# Patient Record
Sex: Male | Born: 1940 | Race: White | Hispanic: No | Smoking: Former smoker
Health system: Southern US, Community
[De-identification: ages and names within clinical notes are randomized; demographics above are authoritative.]

## PROBLEM LIST (undated history)

## (undated) DIAGNOSIS — G911 Obstructive hydrocephalus: Secondary | ICD-10-CM

## (undated) DIAGNOSIS — K1321 Leukoplakia of oral mucosa, including tongue: Secondary | ICD-10-CM

## (undated) DIAGNOSIS — T8859XA Other complications of anesthesia, initial encounter: Secondary | ICD-10-CM

## (undated) DIAGNOSIS — M1990 Unspecified osteoarthritis, unspecified site: Secondary | ICD-10-CM

## (undated) DIAGNOSIS — K5190 Ulcerative colitis, unspecified, without complications: Secondary | ICD-10-CM

## (undated) DIAGNOSIS — H8110 Benign paroxysmal vertigo, unspecified ear: Secondary | ICD-10-CM

## (undated) DIAGNOSIS — J45909 Unspecified asthma, uncomplicated: Secondary | ICD-10-CM

## (undated) DIAGNOSIS — M2540 Effusion, unspecified joint: Secondary | ICD-10-CM

## (undated) DIAGNOSIS — Z860100 Personal history of colon polyps, unspecified: Secondary | ICD-10-CM

## (undated) DIAGNOSIS — E785 Hyperlipidemia, unspecified: Secondary | ICD-10-CM

## (undated) DIAGNOSIS — R2689 Other abnormalities of gait and mobility: Secondary | ICD-10-CM

## (undated) DIAGNOSIS — T4145XA Adverse effect of unspecified anesthetic, initial encounter: Secondary | ICD-10-CM

## (undated) DIAGNOSIS — I10 Essential (primary) hypertension: Secondary | ICD-10-CM

## (undated) DIAGNOSIS — H269 Unspecified cataract: Secondary | ICD-10-CM

## (undated) DIAGNOSIS — IMO0001 Reserved for inherently not codable concepts without codable children: Secondary | ICD-10-CM

## (undated) DIAGNOSIS — K219 Gastro-esophageal reflux disease without esophagitis: Secondary | ICD-10-CM

## (undated) DIAGNOSIS — Z9289 Personal history of other medical treatment: Secondary | ICD-10-CM

## (undated) DIAGNOSIS — C4491 Basal cell carcinoma of skin, unspecified: Secondary | ICD-10-CM

## (undated) HISTORY — DX: Hyperlipidemia, unspecified: E78.5

## (undated) HISTORY — DX: Benign paroxysmal vertigo, unspecified ear: H81.10

## (undated) HISTORY — PX: COLONOSCOPY: SHX174

## (undated) HISTORY — DX: Obstructive hydrocephalus: G91.1

## (undated) HISTORY — DX: Ulcerative colitis, unspecified, without complications: K51.90

## (undated) HISTORY — DX: Other abnormalities of gait and mobility: R26.89

## (undated) HISTORY — DX: Essential (primary) hypertension: I10

---

## 1989-04-11 DIAGNOSIS — K1321 Leukoplakia of oral mucosa, including tongue: Secondary | ICD-10-CM

## 1989-04-11 HISTORY — DX: Leukoplakia of oral mucosa, including tongue: K13.21

## 1999-04-12 HISTORY — PX: BASAL CELL CARCINOMA EXCISION: SHX1214

## 1999-11-28 ENCOUNTER — Ambulatory Visit (HOSPITAL_COMMUNITY): Admission: RE | Admit: 1999-11-28 | Discharge: 1999-11-28 | Payer: Self-pay | Admitting: Gastroenterology

## 2003-10-23 ENCOUNTER — Ambulatory Visit (HOSPITAL_COMMUNITY): Admission: RE | Admit: 2003-10-23 | Discharge: 2003-10-23 | Payer: Self-pay | Admitting: Gastroenterology

## 2004-06-13 ENCOUNTER — Ambulatory Visit: Payer: Self-pay | Admitting: Family Medicine

## 2004-07-09 ENCOUNTER — Ambulatory Visit: Payer: Self-pay | Admitting: Family Medicine

## 2004-08-30 ENCOUNTER — Ambulatory Visit: Payer: Self-pay | Admitting: Family Medicine

## 2004-12-30 ENCOUNTER — Ambulatory Visit: Payer: Self-pay | Admitting: Family Medicine

## 2005-05-05 ENCOUNTER — Ambulatory Visit: Payer: Self-pay | Admitting: Family Medicine

## 2005-05-12 ENCOUNTER — Ambulatory Visit: Payer: Self-pay

## 2005-05-21 ENCOUNTER — Encounter: Payer: Self-pay | Admitting: Cardiology

## 2005-05-21 ENCOUNTER — Ambulatory Visit: Payer: Self-pay | Admitting: Cardiology

## 2005-05-21 ENCOUNTER — Ambulatory Visit (HOSPITAL_COMMUNITY): Admission: RE | Admit: 2005-05-21 | Discharge: 2005-05-21 | Payer: Self-pay | Admitting: Family Medicine

## 2005-05-22 ENCOUNTER — Ambulatory Visit: Payer: Self-pay | Admitting: Family Medicine

## 2005-06-10 ENCOUNTER — Ambulatory Visit: Payer: Self-pay | Admitting: Family Medicine

## 2006-06-10 ENCOUNTER — Ambulatory Visit: Payer: Self-pay | Admitting: Family Medicine

## 2006-10-06 ENCOUNTER — Ambulatory Visit: Payer: Self-pay | Admitting: Family Medicine

## 2008-02-02 ENCOUNTER — Ambulatory Visit (HOSPITAL_COMMUNITY): Admission: RE | Admit: 2008-02-02 | Discharge: 2008-02-02 | Payer: Self-pay | Admitting: Family Medicine

## 2008-02-21 ENCOUNTER — Inpatient Hospital Stay (HOSPITAL_COMMUNITY): Admission: RE | Admit: 2008-02-21 | Discharge: 2008-02-26 | Payer: Self-pay | Admitting: Urology

## 2008-02-21 ENCOUNTER — Encounter (INDEPENDENT_AMBULATORY_CARE_PROVIDER_SITE_OTHER): Payer: Self-pay | Admitting: Urology

## 2008-02-21 HISTORY — PX: SUPRAPUBIC PROSTATECTOMY: SUR1056

## 2008-11-29 ENCOUNTER — Encounter: Admission: RE | Admit: 2008-11-29 | Discharge: 2008-11-29 | Payer: Self-pay | Admitting: Neurosurgery

## 2009-01-29 ENCOUNTER — Encounter: Admission: RE | Admit: 2009-01-29 | Discharge: 2009-04-29 | Payer: Self-pay | Admitting: Family Medicine

## 2009-04-30 ENCOUNTER — Encounter: Admission: RE | Admit: 2009-04-30 | Discharge: 2009-07-29 | Payer: Self-pay | Admitting: Family Medicine

## 2009-07-30 ENCOUNTER — Encounter: Admission: RE | Admit: 2009-07-30 | Discharge: 2009-08-13 | Payer: Self-pay | Admitting: Family Medicine

## 2009-09-04 ENCOUNTER — Encounter: Admission: RE | Admit: 2009-09-04 | Discharge: 2009-11-28 | Payer: Self-pay | Admitting: Family Medicine

## 2010-04-09 ENCOUNTER — Encounter: Admission: RE | Admit: 2010-04-09 | Discharge: 2010-07-10 | Payer: Self-pay | Admitting: Family Medicine

## 2010-04-26 ENCOUNTER — Encounter: Admission: RE | Admit: 2010-04-26 | Discharge: 2010-04-26 | Payer: Self-pay | Admitting: Neurosurgery

## 2010-07-11 ENCOUNTER — Encounter
Admission: RE | Admit: 2010-07-11 | Discharge: 2010-08-08 | Payer: Self-pay | Source: Home / Self Care | Attending: Family Medicine | Admitting: Family Medicine

## 2010-08-08 ENCOUNTER — Ambulatory Visit (HOSPITAL_COMMUNITY)
Admission: RE | Admit: 2010-08-08 | Discharge: 2010-08-08 | Payer: Self-pay | Source: Home / Self Care | Attending: Family Medicine | Admitting: Family Medicine

## 2010-08-11 ENCOUNTER — Encounter: Admission: RE | Admit: 2010-08-11 | Payer: Self-pay | Source: Home / Self Care | Admitting: Family Medicine

## 2010-11-12 ENCOUNTER — Other Ambulatory Visit: Payer: Self-pay | Admitting: Dermatology

## 2010-12-02 ENCOUNTER — Ambulatory Visit: Payer: Medicare Other | Admitting: Physical Therapy

## 2010-12-03 ENCOUNTER — Other Ambulatory Visit: Payer: Self-pay | Admitting: Dermatology

## 2010-12-16 ENCOUNTER — Ambulatory Visit: Payer: Medicare Other | Attending: Family Medicine | Admitting: Physical Therapy

## 2010-12-16 DIAGNOSIS — R5381 Other malaise: Secondary | ICD-10-CM | POA: Insufficient documentation

## 2010-12-16 DIAGNOSIS — IMO0001 Reserved for inherently not codable concepts without codable children: Secondary | ICD-10-CM | POA: Insufficient documentation

## 2010-12-16 DIAGNOSIS — R269 Unspecified abnormalities of gait and mobility: Secondary | ICD-10-CM | POA: Insufficient documentation

## 2010-12-17 ENCOUNTER — Ambulatory Visit: Payer: Medicare Other | Admitting: *Deleted

## 2010-12-19 ENCOUNTER — Ambulatory Visit: Payer: Medicare Other | Admitting: Physical Therapy

## 2010-12-23 ENCOUNTER — Ambulatory Visit: Payer: Medicare Other | Admitting: Physical Therapy

## 2010-12-24 ENCOUNTER — Ambulatory Visit: Payer: Medicare Other | Admitting: Physical Therapy

## 2010-12-24 NOTE — Op Note (Signed)
NAMECLAYBURN, [name]                  ACCOUNT NO.:  [id number]   MEDICAL RECORD NO.:  [id number]          PATIENT TYPE:  INP   LOCATION:  1432                         FACILITY:  [facility]   PHYSICIAN:  Sigmund I. Patsi Sears, M.D.DATE OF BIRTH:  06-15-1941   DATE OF PROCEDURE:  DATE OF DISCHARGE:                               OPERATIVE REPORT   PREOPERATIVE DIAGNOSIS:  Benign prostatic hypertrophy with urinary  obstruction.   POSTOPERATIVE DIAGNOSIS:  Benign prostatic hypertrophy with urinary  obstruction.   PROCEDURE:  1. Suprapubic prostatectomy.  2. Placement of On-Q dual-lumen pain infusion pump.   ATTENDING PHYSICIAN:  Sigmund I. Patsi Sears, M.D.   RESIDENT PHYSICIAN:  Maudie Flakes, M.D.   ANESTHESIA:  General plus local.   INDICATIONS FOR PROCEDURE:  Mr. Lofaro is a 70 year old white male with  past medical history positive for benign prostatic  hypertrophy/hyperplasia with obstruction.  This has been refractory to  conservative therapy including oral medications.  Preoperatively his  ultrasound demonstrated a gland of approximately 150 mL with a prominent  median lobe.  It was felt our office-based endoscopic therapies likewise  would be technically difficult and would likely have a high rate of  failure or complications.  After risks, benefits, consequences and  concerns were discussed with the patient, informed consent was obtained.   PROCEDURE NOTE:  The patient was brought to the operating room and  placed in supine position.  He was correctly identified by his  wristband, appropriate time-out was taken.  IV antibiotics were  administered.  A general anesthesia was delivered.  Once adequately  anesthetized, his lower abdomen was clipped, prepped and draped  sterilely.  The table was made in slight Trendelenburg.  We made a low  midline incision from the umbilicus to the pubic symphysis.  We used the  combination of blunt dissection and electrocautery to get to the level  of the anterior rectus sheath.  This was sharply divided with  electrocautery in the midline and separated.  The rectus muscles were  gently separated as well.  We placed our Balfour retractor.  We used  blunt dissection to sweep off the mesenchymal tissues from the  anterolateral surface of the bladder on both sides.  He had a 24-French  ureteral catheter in place with the balloon inflated to 30 mL of sterile  water.  The balloon was easily palpated in the bladder and the bladder  was slightly filled.  The large prostate gland was also readily  apparent.  We then placed three holding sutures, one was a figure-of-  eight suture in the midline just cranial to the bladder neck, the other  two were on either side to the anterior aspect of the dome of bladder.  We then made our cystotomy in a vertical fashion using Bovie  electrocautery.  The urine and bladder irrigation thus drained freely.  We replaced our Balfour retractor inside of the bladder.  We easily  identified prominent bifid median lobe to the prostate gland.  We were  able to cannulate both ureteral orifices with 5-French feeding tubes.  He had been  given indigo carmine and the drainage from both feeding  tubes was likewise blue.  We then used Bovie electrocautery to score out  our incision line to the anterior aspect of the posterior margin of the  median lobe.  We then used Metzenbaum scissors to find the appropriate  plane between the adenoma the capsule.  We then developed this plane  circumferentially at all aspects of the prostate gland including the  median lobe and both lateral lobes.  We were able to freely dissect down  to the apex of the gland where we completed our prostatectomy by  amputating this part in easily a pincher method.  The prostatic adenoma  was subsequently elevated out of the prostatic fossa.  The balloon of  the Foley catheter was let down and the prostate specimen was passed off  of the table after it  was appropriately marked for pathology.  We then  turned our attention to closure.  We copiously irrigated the bladder and  prostatic fossa with normal saline.  We placed two figure-of-eight  sutures, one at 5 o'clock, the other at the 7 o'clock position in the  prostate to help with hemostasis.  Once these sutures were placed,  we  then placed Surgicel down in the prostatic fossa to also help with  hemostasis.  We reapproximated the bladder mucosa that was overlying the  median lobe to the bladder mucosa at the distal prostatic urethra.  We  then added some more irrigation to our Foley catheter balloon, bringing  it to a total of 45 mL of sterile water.  We then removed our Balfour  retractor.  We verified the patency of both ureters by again passing the  feeding tubes in the ureters without any difficulty.  We then placed a  left-sided 24-French suprapubic tube by making a stab wound in the left  lower quadrant and bringing a Kelly clamp out from just anterior to the  rectus muscle through the fascia, out through the skin, and pulling our  suprapubic tube into the peritoneal cavity.  We then did the same thing  through our bladder wall.  The suprapubic catheter was brought into the  bladder and the balloon was inflated with 5 mL of sterile water.  We  then closed the bladder in two layers using 2-0 Vicryl.  The deep layer  reapproximated mucosa to mucosa fashion.  The superficial layer  reapproximated the muscle and serosa in an imbricating fashion.  We then  tested the bladder and it was watertight.  We then placed a right-sided  flat Blake drain by making a stab incision in the right lower quadrant,  bringing a tonsil out from the prerectal space.  We then placed our  drain into the deep pelvis.  We secured both the suprapubic tube and the  Blake drain with Ethilon suture.  We then turned our attention to our  wound closure.  Again we copiously irrigated our surgical bed and  verified  hemostasis.  We closed the fascia with a running suture using 0  PDS.  After suture we verified this digitally and there were no areas of  weakness or possible site for herniation.  We then placed our On-Q  infusion catheters by advancing the needle and sheath just above the  rectus fascia, one on the right and one on the left.  The needle was  removed, the pain catheter was advanced through the sheath, and once  perfectly positioned, the sheath was removed and  both catheters were  connected to the infusion pump.  We then closed the skin with surgical  staples.  This marked the end of our procedure.  He tolerated the  procedure well.  There were no complications.  Dr. Patsi Sears was  present and participated in all aspects of the case.  General anesthesia  was reversed.  He was extubated and taken to the recovery room in stable  condition.   It is our conclusion that at the end of the case that this was the best  operative treatment for Mr. Lachney given the large size of his gland, the  prominence of his median lobe, and his general prostatic anatomy.     ______________________________  Maudie Flakes, M.D.      Sigmund I. Patsi Sears, M.D.  Electronically Signed    DW/MEDQ  D:  02/21/2008  T:  02/21/2008  Job:  [id number]

## 2010-12-26 ENCOUNTER — Ambulatory Visit: Payer: Medicare Other | Admitting: *Deleted

## 2010-12-27 NOTE — Op Note (Signed)
[name], [name]                            ACCOUNT NO.:  [id number]   MEDICAL RECORD NO.:  [id number]                   PATIENT TYPE:  AMB   LOCATION:  ENDO                                 FACILITY:  [facility]   PHYSICIAN:  James L. Malon Kindle., M.D.          DATE OF BIRTH:  1941/01/09   DATE OF PROCEDURE:  10/23/2003  DATE OF DISCHARGE:                                 OPERATIVE REPORT   PROCEDURE:  Colonoscopy.   MEDICATIONS:  Fentanyl 75 mcg, Versed 6 mg IV.   INDICATIONS FOR PROCEDURE:  Done as a three year followup. The patient's had  previous adenomatous colon polyps __________ and removed, this is done as a  followup.   DESCRIPTION OF PROCEDURE:  The procedure had been explained to the patient  and consent obtained. With the patient in the left lateral decubitus  position, the Olympus scope was inserted and advanced under direct  visualization. The prep was excellent. We were able to reach the cecum  without any problem. The ileocecal valve and appendiceal orifice were seen.  The scope was withdrawn and the cecum, ascending colon, transverse colon,  splenic flexure, descending and sigmoid colon were seen well, no polyps or  other lesions were seen. The sigmoid and rectum were free of polyps. The  scope was withdrawn. The patient tolerated the procedure well and was  maintained on low flow oxygen and pulse oximeter throughout the procedure.   ASSESSMENT:  Previous history of colon polyps with no polyps seen on this  exam, V12.72.   PLAN:  Will recommend yearly Hemoccults and repeat procedure in five years.                                               James L. Malon Kindle., M.D.    Waldron Session  D:  10/23/2003  T:  10/23/2003  Job:  [id number]   cc:   Teena Irani. Arlyce Dice, M.D.  [address]  [place]  [place] [address]  Fax: [phone]

## 2010-12-27 NOTE — Discharge Summary (Signed)
[name], [name]                  ACCOUNT NO.:  [id number]   MEDICAL RECORD NO.:  [id number]          PATIENT TYPE:  INP   LOCATION:  1432                         FACILITY:  [facility]   PHYSICIAN:  Sigmund I. Patsi Sears, M.D.DATE OF BIRTH:  1941-05-17   DATE OF ADMISSION:  02/21/2008  DATE OF DISCHARGE:  02/26/2008                               DISCHARGE SUMMARY   FINAL DIAGNOSIS:  Benign prostatic hypertrophy.   OPERATION:  February 21, 2008.  The operation is suprapubic prostatectomy  and placement of On-Q dual lumen pain infusion pump.   HISTORY:  Mr. [name] is a 70 year old male who was evaluated in the office  with urinary urgency, urge incontinence.  The patient was originally  treated with Flomax with improvement in his symptoms, but eventually  failed medical therapy.  The patient was noted on cystoscopy to have a  very large prostate, and on cystoscopy had a large median lobe.  He is  scheduled for open suprapubic prostatectomy.  The patient  has been  having gross hematuria secondary to median lobe bleeding, with failure  of combination medical therapy with alpha blocker and 5-alpha reductase  treatments.  He was evaluated in the office, and felt to be a poor  candidate for microwave thermotherapy, radiofrequency needle placement,  laser prostatectomy, or TURP.  Because of enlarged median lobe, he is  scheduled for open suprapubic prostatectomy.   The patient was admitted via the operating room, following suprapubic  prostatectomy and placement of a pain pump.  The patient did well  postoperatively, and underwent clot irrigation from his bladder.  This  led to a syncopal episode, from which the patient recovered with rapid  response.  The patient's urine cleared, and his hemoglobin stabilized at  7.7.  He was transfused 2 units and responded well with a hemoglobin of  9.3.  On the 17th, the pain pump was  removed.  The patient was placed  on oral pain medication, and on July 18,  the patient was allowed to be  discharged.  The patient will return for cystogram, and suprapubic tube  removal.  Pathology showed BPH only.  He is discharged in stable  condition.      Sigmund I. Patsi Sears, M.D.  Electronically Signed     SIT/MEDQ  D:  04/20/2008  T:  04/20/2008  Job:  [id number]   cc:   Delaney Meigs, M.D.  Fax: [phone]

## 2010-12-27 NOTE — Procedures (Signed)
[place]  Patient:    [name], [name]                         MRN: [id number] Proc. Date: 11/28/99 Adm. Date:  [id number] Attending:  Orland Mustard CC:         Nicky Pugh. Lenon Ahmadi, M.D.                           Procedure Report  PROCEDURE PERFORMED:  Colonoscopy with polypectomy.  ENDOSCOPIST:  Llana Aliment. Randa Evens, M.D.  MEDICATIONS USED:  Fentanyl 75 mcg, Versed 6 mg IV.  INDICATIONS:  The patient underwent a flexible sigmoidoscopy with the finding of a small polyp at 30 cm.  DESCRIPTION OF PROCEDURE:  The procedure had been explained to the patient and consent obtained.  With the patient in the left lateral decubitus position, the Olympus adult video colonoscope was inserted and advanced under direct visualization.  The prep was excellent and we were able to advance to the cecum without difficulty.  The right lower quadrant transilluminated.  The ileocecal valve and appendiceal orifice were seen.  The scope was withdrawn. The cecum, ascending colon, hepatic flexure, transverse colon, splenic flexure, descending and sigmoid colon were seen well.  At 30 cm a 0.5 cm sessile polyp was removed with a snare and sucked through the scope.  Near the rectosigmoid junction another 0.5 cm polyp was snared and sucked through the scope.  Both polyps were recovered.  The scope was withdrawn, patient tolerated the procedure well.  Maintained on low flow oxygen and pulse oximeter throughout the procedure with no obvious problem.  ASSESSMENT:  Polyps in the rectosigmoid and sigmoid colon.  PLAN:  Will check path.  I suspect these will be adenomatous.  If so, will recommend repeating in three years.  Routine postpolypectomy instructions. DD:  11/28/99 TD:  11/28/99 Job: [id number]

## 2010-12-30 ENCOUNTER — Ambulatory Visit: Payer: Medicare Other | Admitting: Physical Therapy

## 2011-01-01 ENCOUNTER — Ambulatory Visit: Payer: Medicare Other | Admitting: Physical Therapy

## 2011-01-02 ENCOUNTER — Ambulatory Visit: Payer: Medicare Other | Admitting: Physical Therapy

## 2011-01-07 ENCOUNTER — Ambulatory Visit: Payer: Medicare Other | Admitting: Physical Therapy

## 2011-01-09 ENCOUNTER — Ambulatory Visit: Payer: Medicare Other | Attending: Family Medicine | Admitting: Physical Therapy

## 2011-01-09 DIAGNOSIS — IMO0001 Reserved for inherently not codable concepts without codable children: Secondary | ICD-10-CM | POA: Insufficient documentation

## 2011-01-09 DIAGNOSIS — R269 Unspecified abnormalities of gait and mobility: Secondary | ICD-10-CM | POA: Insufficient documentation

## 2011-01-09 DIAGNOSIS — R5381 Other malaise: Secondary | ICD-10-CM | POA: Insufficient documentation

## 2011-01-13 ENCOUNTER — Ambulatory Visit: Payer: Medicare Other | Attending: Family Medicine | Admitting: Physical Therapy

## 2011-01-13 DIAGNOSIS — R269 Unspecified abnormalities of gait and mobility: Secondary | ICD-10-CM | POA: Insufficient documentation

## 2011-01-13 DIAGNOSIS — R5381 Other malaise: Secondary | ICD-10-CM | POA: Insufficient documentation

## 2011-01-13 DIAGNOSIS — IMO0001 Reserved for inherently not codable concepts without codable children: Secondary | ICD-10-CM | POA: Insufficient documentation

## 2011-01-16 ENCOUNTER — Ambulatory Visit: Payer: Medicare Other | Admitting: Physical Therapy

## 2011-01-20 ENCOUNTER — Ambulatory Visit: Payer: Medicare Other | Admitting: Physical Therapy

## 2011-01-23 ENCOUNTER — Ambulatory Visit: Payer: Medicare Other | Admitting: Physical Therapy

## 2011-01-27 ENCOUNTER — Ambulatory Visit: Payer: Medicare Other | Admitting: Physical Therapy

## 2011-01-30 ENCOUNTER — Ambulatory Visit: Payer: Medicare Other | Admitting: Physical Therapy

## 2011-02-04 ENCOUNTER — Ambulatory Visit: Payer: Medicare Other | Admitting: Physical Therapy

## 2011-02-06 ENCOUNTER — Ambulatory Visit: Payer: Medicare Other | Admitting: Physical Therapy

## 2011-02-10 ENCOUNTER — Ambulatory Visit: Payer: Medicare Other | Attending: Family Medicine | Admitting: Physical Therapy

## 2011-02-10 DIAGNOSIS — R269 Unspecified abnormalities of gait and mobility: Secondary | ICD-10-CM | POA: Insufficient documentation

## 2011-02-10 DIAGNOSIS — IMO0001 Reserved for inherently not codable concepts without codable children: Secondary | ICD-10-CM | POA: Insufficient documentation

## 2011-02-10 DIAGNOSIS — R5381 Other malaise: Secondary | ICD-10-CM | POA: Insufficient documentation

## 2011-02-13 ENCOUNTER — Ambulatory Visit: Payer: Medicare Other | Admitting: Physical Therapy

## 2011-02-17 ENCOUNTER — Ambulatory Visit: Payer: Medicare Other | Admitting: Physical Therapy

## 2011-02-20 ENCOUNTER — Ambulatory Visit: Payer: Medicare Other | Admitting: Physical Therapy

## 2011-02-25 ENCOUNTER — Ambulatory Visit: Payer: Medicare Other | Admitting: Physical Therapy

## 2011-02-27 ENCOUNTER — Ambulatory Visit: Payer: Medicare Other | Admitting: Physical Therapy

## 2011-05-08 ENCOUNTER — Ambulatory Visit: Payer: PRIVATE HEALTH INSURANCE | Admitting: Physical Therapy

## 2011-05-08 LAB — BASIC METABOLIC PANEL
BUN: 10
BUN: 15
CO2: 26
CO2: 28
Calcium: 8.2 — ABNORMAL LOW
Calcium: 9.5
Chloride: 100
Chloride: 102
Creatinine, Ser: 0.66
Creatinine, Ser: 0.81
GFR calc Af Amer: >60
GFR calc Af Amer: >60
GFR calc non Af Amer: >60
GFR calc non Af Amer: >60
Glucose, Bld: 143 — ABNORMAL HIGH
Glucose, Bld: 88
Potassium: 4
Potassium: 4.6
Sodium: 135
Sodium: 137

## 2011-05-08 LAB — HEMOGLOBIN AND HEMATOCRIT, BLOOD
HCT: 24.7 — ABNORMAL LOW
HCT: 44.7
Hemoglobin: 15.2
Hemoglobin: 8.5 — ABNORMAL LOW

## 2011-05-08 LAB — TYPE AND SCREEN
ABO/RH(D): O POS
Antibody Screen: NEGATIVE

## 2011-05-08 LAB — CARDIAC PANEL(CRET KIN+CKTOT+MB+TROPI)
CK, MB: 3.5
CK, MB: 4.1 — ABNORMAL HIGH
Relative Index: 1.9
Relative Index: 2.5
Total CK: 167
Total CK: 180
Troponin I: 0.01
Troponin I: 0.02

## 2011-05-08 LAB — DIFFERENTIAL
Basophils Absolute: 0
Basophils Relative: 0
Eosinophils Absolute: 0
Eosinophils Relative: 0
Lymphocytes Relative: 11 — ABNORMAL LOW
Lymphs Abs: 0.8
Monocytes Absolute: 0.6
Monocytes Relative: 8
Neutro Abs: 5.9
Neutrophils Relative %: 80 — ABNORMAL HIGH

## 2011-05-08 LAB — CBC
HCT: 28.5 — ABNORMAL LOW
Hemoglobin: 9.8 — ABNORMAL LOW
MCHC: 34.4
MCV: 90.7
Platelets: 173
RBC: 3.14 — ABNORMAL LOW
RDW: 13
WBC: 7.3

## 2011-05-08 LAB — CREATININE, FLUID (PLEURAL, PERITONEAL, JP DRAINAGE): Creat, Fluid: 0.8

## 2011-05-08 LAB — ABO/RH: ABO/RH(D): O POS

## 2011-05-09 LAB — TROPONIN I: Troponin I: 0.03

## 2011-05-09 LAB — CK ISOENZYMES
CK-BB: 3 % — ABNORMAL HIGH
CK-MB: 0 % (ref <5)
CK-MM: 70
Creatine Kinase-Total: 105 U/L (ref 44–196)

## 2011-05-09 LAB — HEMOGLOBIN AND HEMATOCRIT, BLOOD
HCT: 22.4 — ABNORMAL LOW
HCT: 26.9 — ABNORMAL LOW
Hemoglobin: 7.7 — CL
Hemoglobin: 9.3 — ABNORMAL LOW

## 2011-05-13 ENCOUNTER — Ambulatory Visit: Payer: PRIVATE HEALTH INSURANCE | Admitting: Physical Therapy

## 2011-05-16 ENCOUNTER — Other Ambulatory Visit: Payer: Self-pay | Admitting: Neurology

## 2011-05-16 DIAGNOSIS — I10 Essential (primary) hypertension: Secondary | ICD-10-CM

## 2011-05-16 DIAGNOSIS — E785 Hyperlipidemia, unspecified: Secondary | ICD-10-CM

## 2011-05-16 DIAGNOSIS — G911 Obstructive hydrocephalus: Secondary | ICD-10-CM

## 2011-05-16 DIAGNOSIS — R269 Unspecified abnormalities of gait and mobility: Secondary | ICD-10-CM

## 2011-05-16 DIAGNOSIS — H8110 Benign paroxysmal vertigo, unspecified ear: Secondary | ICD-10-CM

## 2011-05-21 ENCOUNTER — Ambulatory Visit
Admission: RE | Admit: 2011-05-21 | Discharge: 2011-05-21 | Disposition: A | Discharge status: Home / Self Care | Payer: Medicare Other | Source: Ambulatory Visit | Attending: Neurology | Admitting: Neurology

## 2011-05-21 DIAGNOSIS — I10 Essential (primary) hypertension: Secondary | ICD-10-CM

## 2011-05-21 DIAGNOSIS — H8110 Benign paroxysmal vertigo, unspecified ear: Secondary | ICD-10-CM

## 2011-05-21 DIAGNOSIS — R269 Unspecified abnormalities of gait and mobility: Secondary | ICD-10-CM

## 2011-05-21 DIAGNOSIS — E785 Hyperlipidemia, unspecified: Secondary | ICD-10-CM

## 2011-05-21 DIAGNOSIS — G911 Obstructive hydrocephalus: Secondary | ICD-10-CM

## 2011-05-28 ENCOUNTER — Ambulatory Visit: Payer: Medicare Other | Attending: Neurology | Admitting: Physical Therapy

## 2011-05-28 DIAGNOSIS — R269 Unspecified abnormalities of gait and mobility: Secondary | ICD-10-CM | POA: Insufficient documentation

## 2011-05-28 DIAGNOSIS — M6281 Muscle weakness (generalized): Secondary | ICD-10-CM | POA: Insufficient documentation

## 2011-05-28 DIAGNOSIS — IMO0001 Reserved for inherently not codable concepts without codable children: Secondary | ICD-10-CM | POA: Insufficient documentation

## 2011-06-04 ENCOUNTER — Ambulatory Visit: Payer: Medicare Other | Admitting: Physical Therapy

## 2011-06-06 ENCOUNTER — Ambulatory Visit: Payer: Medicare Other | Admitting: Physical Therapy

## 2011-06-09 ENCOUNTER — Ambulatory Visit: Payer: Medicare Other | Admitting: Physical Therapy

## 2011-06-11 ENCOUNTER — Ambulatory Visit: Payer: Medicare Other | Admitting: Physical Therapy

## 2011-06-17 ENCOUNTER — Ambulatory Visit: Payer: Medicare Other | Attending: Neurology | Admitting: Physical Therapy

## 2011-06-17 DIAGNOSIS — IMO0001 Reserved for inherently not codable concepts without codable children: Secondary | ICD-10-CM | POA: Insufficient documentation

## 2011-06-17 DIAGNOSIS — M6281 Muscle weakness (generalized): Secondary | ICD-10-CM | POA: Insufficient documentation

## 2011-06-17 DIAGNOSIS — R269 Unspecified abnormalities of gait and mobility: Secondary | ICD-10-CM | POA: Insufficient documentation

## 2011-06-19 ENCOUNTER — Encounter: Payer: Medicare Other | Admitting: Physical Therapy

## 2011-06-19 ENCOUNTER — Ambulatory Visit: Payer: Medicare Other | Admitting: Physical Therapy

## 2011-06-23 ENCOUNTER — Ambulatory Visit: Payer: Medicare Other | Admitting: Physical Therapy

## 2011-06-25 ENCOUNTER — Ambulatory Visit: Payer: Medicare Other | Admitting: Physical Therapy

## 2011-06-30 ENCOUNTER — Encounter: Payer: Medicare Other | Admitting: Physical Therapy

## 2011-07-01 ENCOUNTER — Ambulatory Visit: Payer: Medicare Other | Admitting: Physical Therapy

## 2011-07-07 ENCOUNTER — Encounter: Payer: Medicare Other | Admitting: Physical Therapy

## 2011-07-09 ENCOUNTER — Ambulatory Visit: Payer: Medicare Other | Admitting: Physical Therapy

## 2011-07-14 ENCOUNTER — Ambulatory Visit: Payer: Medicare Other | Attending: Neurology | Admitting: Physical Therapy

## 2011-07-14 ENCOUNTER — Encounter: Payer: Medicare Other | Admitting: Physical Therapy

## 2011-07-14 DIAGNOSIS — IMO0001 Reserved for inherently not codable concepts without codable children: Secondary | ICD-10-CM | POA: Insufficient documentation

## 2011-07-14 DIAGNOSIS — R269 Unspecified abnormalities of gait and mobility: Secondary | ICD-10-CM | POA: Insufficient documentation

## 2011-07-14 DIAGNOSIS — M6281 Muscle weakness (generalized): Secondary | ICD-10-CM | POA: Insufficient documentation

## 2011-07-15 ENCOUNTER — Encounter: Payer: Medicare Other | Admitting: Physical Therapy

## 2011-07-17 ENCOUNTER — Encounter: Payer: Medicare Other | Admitting: Physical Therapy

## 2011-07-18 ENCOUNTER — Ambulatory Visit: Payer: Medicare Other | Admitting: Physical Therapy

## 2011-07-21 ENCOUNTER — Encounter: Payer: Medicare Other | Admitting: Physical Therapy

## 2011-07-22 ENCOUNTER — Ambulatory Visit: Payer: Medicare Other | Admitting: Physical Therapy

## 2011-07-24 ENCOUNTER — Encounter: Payer: Medicare Other | Admitting: Physical Therapy

## 2011-07-25 ENCOUNTER — Ambulatory Visit: Payer: Medicare Other | Admitting: Physical Therapy

## 2011-07-28 ENCOUNTER — Encounter: Payer: Medicare Other | Admitting: Physical Therapy

## 2011-07-29 ENCOUNTER — Encounter: Payer: Medicare Other | Admitting: Physical Therapy

## 2012-02-27 ENCOUNTER — Ambulatory Visit: Payer: Medicare Other | Attending: Neurology | Admitting: Physical Therapy

## 2012-02-27 DIAGNOSIS — R269 Unspecified abnormalities of gait and mobility: Secondary | ICD-10-CM | POA: Insufficient documentation

## 2012-02-27 DIAGNOSIS — IMO0001 Reserved for inherently not codable concepts without codable children: Secondary | ICD-10-CM | POA: Insufficient documentation

## 2012-03-03 ENCOUNTER — Ambulatory Visit: Payer: Medicare Other | Admitting: Physical Therapy

## 2012-03-05 ENCOUNTER — Ambulatory Visit: Payer: Medicare Other | Admitting: Physical Therapy

## 2012-03-10 ENCOUNTER — Ambulatory Visit: Payer: Medicare Other | Admitting: Physical Therapy

## 2012-03-12 ENCOUNTER — Ambulatory Visit: Payer: Medicare Other | Attending: Neurology | Admitting: Physical Therapy

## 2012-03-12 DIAGNOSIS — R269 Unspecified abnormalities of gait and mobility: Secondary | ICD-10-CM | POA: Insufficient documentation

## 2012-03-12 DIAGNOSIS — IMO0001 Reserved for inherently not codable concepts without codable children: Secondary | ICD-10-CM | POA: Insufficient documentation

## 2012-03-16 ENCOUNTER — Ambulatory Visit: Payer: Medicare Other | Admitting: Physical Therapy

## 2012-03-17 ENCOUNTER — Ambulatory Visit: Payer: Medicare Other | Admitting: Physical Therapy

## 2012-03-24 ENCOUNTER — Ambulatory Visit: Payer: Medicare Other | Admitting: Physical Therapy

## 2012-03-26 ENCOUNTER — Ambulatory Visit: Payer: Medicare Other | Admitting: Physical Therapy

## 2012-11-01 ENCOUNTER — Other Ambulatory Visit: Payer: Self-pay | Admitting: Dermatology

## 2012-12-16 ENCOUNTER — Other Ambulatory Visit: Payer: Self-pay

## 2012-12-16 MED ORDER — CARBIDOPA-LEVODOPA 25-100 MG PO TABS: 1.0000 | ORAL_TABLET | Freq: Three times a day (TID) | ORAL | Status: DC

## 2012-12-16 NOTE — Telephone Encounter (Signed)
Former Love patient assigned to Dr Athar.  

## 2013-01-04 ENCOUNTER — Telehealth: Payer: Self-pay | Admitting: Neurology

## 2013-01-04 MED ORDER — DONEPEZIL HCL 5 MG PO TABS: 5.0000 mg | ORAL_TABLET | Freq: Every day | ORAL | Status: DC

## 2013-01-04 MED ORDER — CARBIDOPA-LEVODOPA 25-100 MG PO TABS: 1.0000 | ORAL_TABLET | Freq: Three times a day (TID) | ORAL | Status: DC

## 2013-01-04 NOTE — Telephone Encounter (Signed)
Rx sent.  I called patient.  They are aware Rx's have were sent.

## 2013-02-04 ENCOUNTER — Encounter: Payer: Self-pay | Admitting: Neurology

## 2013-02-04 ENCOUNTER — Ambulatory Visit (INDEPENDENT_AMBULATORY_CARE_PROVIDER_SITE_OTHER): Payer: Medicare Other | Admitting: Neurology

## 2013-02-04 VITALS — BP 121/73 | HR 64 | Ht 66.5 in | Wt 162.0 lb

## 2013-02-04 DIAGNOSIS — R269 Unspecified abnormalities of gait and mobility: Secondary | ICD-10-CM | POA: Insufficient documentation

## 2013-02-04 DIAGNOSIS — G911 Obstructive hydrocephalus: Secondary | ICD-10-CM

## 2013-02-04 NOTE — Patient Instructions (Addendum)
I think overall you are doing fairly well but I do want to suggest a few things today:  Remember to drink plenty of fluid, eat healthy meals and do not skip any meals. Try to eat protein with a every meal and eat a healthy snack such as fruit or nuts in between meals. Try to keep a regular sleep-wake schedule and try to exercise daily, particularly in the form of walking, 20-30 minutes a day, if you can.   As far as your medications are concerned, I would like to suggest no changes   As far as diagnostic testing: no new test needed.   I would like to see you back in 6 months, sooner if we need to. Please call us with any interim questions, concerns, problems, updates or refill requests.  Brett Canales is my clinical assistant and will answer any of your questions and relay your messages to me and also relay most of my messages to you.  Our phone number is [phone]. We also have an after hours call service for urgent matters and there is a physician on-call for urgent questions. For any emergencies you know to call 911 or go to the nearest emergency room.

## 2013-02-04 NOTE — Progress Notes (Signed)
Subjective:    Patient ID: [name] is a 72 y.o. male.  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 The patient is accompanied by his wife today. This is his first visit with me and he previously saw Dr. love and was last seen by him on 10/05/2012, at which time Dr. Sandria Manly felt that his gait was improved on levodopa therapy. He did also think that the patient had some degree of hydrocephalus. He was encouraged to exercise. The patient has an underlying medical history of hypertension, hyperlipidemia, aqueduct stenosis and knee pain. He had radical prostatectomy in 2009. He is currently on carbidopa-levodopa 25/100 mg strength one tablet 3 times a day, Flexeril, donepezil, Diovan, and Mesalamine.   I reviewed Dr. 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 After his prostate surgery in 2009 he developed marked difficulty with his walking and started using a walker. He had shuffling gait. He was followed by Dr. 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 Physical therapy in 2011 did not help. 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 He has had headaches and dizziness. MRI brain in September 2011 showed similar findings. He fell in 2011 with subsequent bladder incontinence. Head CT was stable for arachnoid cyst and hydrocephalus. EEG in December 2011 was abnormal with transient slowing the right central and temporal  region. There was no epileptiform activity. He was weaned off of Lexapro. EMG and nerve conduction study in March 2012 was normal. 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 He has had memory loss. In October 2012 his MMSE was 27, clock drawing was 4, animal fluency was 9. 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 He was started on Sinemet with improvement in his walking.  He reports improvement with his gait after starting the sinemet, and he recalls having a hard time at the time of his prostate surgery and his gait problems started about 2 years after that. He has L knee pain and swelling. He has had injection into the L knee over 2 years ago. He has not fallen recently. He denies orthostatic hypotension, lightheadedness, dizziness, swelling, hallucinations, memory disturbance, mood disorder.  His Past Medical History Is Significant For: Past Medical History  Diagnosis Date  . Memory loss   . Benign paroxysmal positional vertigo   . Other and unspecified hyperlipidemia   . Unspecified essential hypertension   . Obstructive hydrocephalus   . Abnormality of gait   . Hyperlipidemia     His Past Surgical History Is Significant For: Past Surgical History  Procedure Laterality Date  . Suprapubic prostatectomy  2009    His Family History Is Significant For: Family History  Problem Relation Age of Onset  . Heart failure Father   . Dementia Mother     His Social History Is Significant For: History   Social History  . Marital Status: Married    Spouse Name: N/A  Number of Children: N/A  . Years of Education: N/A   Social History Main Topics  . Smoking status: Former Smoker    Types: Pipe  . Smokeless tobacco: None  . Alcohol Use: No  . Drug Use: No  . Sexually Active: None   Other Topics Concern  . None   Social History Narrative  . None    His Allergies Are:  Allergies   Allergen Reactions  . Lexapro (Escitalopram Oxalate)   . Tetracyclines & Related   :   His Current Medications Are:  Outpatient Encounter Prescriptions as of 02/04/2013  Medication Sig Dispense Refill  . acetaminophen (TYLENOL) 650 MG CR tablet Take 650 mg by mouth every morning.      . carbidopa-levodopa (SINEMET IR) 25-100 MG per tablet Take 1 tablet by mouth 3 (three) times daily.  270 tablet  1  . cyclobenzaprine (FLEXERIL) 10 MG tablet Take 10 mg by mouth at bedtime.      . donepezil (ARICEPT) 5 MG tablet Take 1 tablet (5 mg total) by mouth daily.  90 tablet  1  . mesalamine (APRISO) 0.375 G 24 hr capsule Take 375 mg by mouth 4 (four) times daily. Each morning      . valsartan (DIOVAN) 160 MG tablet Take 160 mg by mouth daily.       No facility-administered encounter medications on file as of 02/04/2013.   Review of Systems  Neurological: Positive for weakness.    Objective:  Neurologic Exam  Physical Exam Physical Examination:   Filed Vitals:   02/04/13 0939  BP: 121/73  Pulse: 64    General Examination: The patient is a very pleasant 72 y.o. male in no acute distress.  HEENT: Macrocephalic, atraumatic, pupils are equal, round and reactive to light and accommodation. Extraocular tracking shows mild saccadic breakdown without nystagmus noted. There is no limitation to gaze. There is no decrease in eye blink rate. Hearing is impaired mildly. Tympanic membranes are clear bilaterally. Face is symmetric with no facial masking and normal facial sensation. There is no lip, neck or jaw tremor. Neck is not rigid with intact passive ROM. There are no carotid bruits on auscultation. Oropharynx exam reveals mild mouth dryness. No significant airway crowding is noted. Mallampati is class II. Tongue protrudes centrally and palate elevates symmetrically.   There is no drooling.   Chest: is clear to auscultation without wheezing, rhonchi or crackles noted.  Heart: sounds are regular and  normal without murmurs, rubs or gallops noted.   Abdomen: is soft, non-tender and non-distended with normal bowel sounds appreciated on auscultation.  Extremities: There is no pitting edema in the distal lower extremities bilaterally. Pedal pulses are intact.   Skin: is warm and dry with no trophic changes noted. Age-related changes are noted on the skin.   Musculoskeletal: exam reveals: Left knee swelling and genu varus on the L  Neurologically:  Mental status: The patient is awake and alert, paying good  attention. He is able to completely provide the history. His wife provides additional details. He is oriented to: person, place, time/date, situation, day of week, month of year and year. His memory, attention, language and knowledge are fairly well intact. There is no aphasia, agnosia, apraxia or anomia. There is a no degree of bradyphrenia. Speech is mildly hypophonic with no dysarthria noted. Mood is congruent and affect is normal.    Cranial nerves are as described above under HEENT exam. In addition, shoulder shrug is normal with equal shoulder  height noted.  Motor exam: Normal bulk, and strength for age is noted. There are no dyskinesias noted. Tone is not rigid with absence of cogwheeling in the extremities. There is overall no bradykinesia. There is no drift or rebound.  There is no tremor.  Romberg is negative, but he has to stand wide-based. Reflexes are 1+ in the upper extremities and 1+ in the lower extremities. Toes are downgoing bilaterally.  Fine motor skills exam: Fine motor skills are fairly well preserved in the upper extremities and mildly impaired in the lower extremities. Cerebellar testing shows no dysmetria or intention tremor on finger to nose testing. Heel to shin is unremarkable bilaterally. There is no truncal or gait ataxia.   Sensory exam is intact to light touch, pinprick, vibration, temperature sense and proprioception in the upper and lower extremities.    Gait, station and balance: He stands up with mild difficulty and has to push himself up. He stands wide-based. He is bowlegged especially in the left. He has significant start hesitation but no classic freezing. He walks with a slight waddle and limp. He walks insecurely and uses his three-pronged cane. His balance is mildly impaired. He does not have a typical parkinsonian shuffle. His posture is fairly age-appropriate.  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 He has done well on levodopa therapy. He is advised to continue with the current dose. He is advised to use his cane or walker at all times since he is at fall risk. He is advised to seek consultation with his orthopedic surgeon again regarding his left knee pain and swelling. He may benefit from physical therapy down the road once his left knee issue is addressed. His wife adds that he did well with physical therapy as far as his gait is concerned.   I answered all their questions today and the patient and his wife were in agreement with the above outlined plan. I would like to see the patient back in 6 months, sooner if the need arises and encouraged them to call with any interim questions, concerns, problems or updates.

## 2013-02-25 ENCOUNTER — Inpatient Hospital Stay (HOSPITAL_COMMUNITY)
Admission: EM | Admit: 2013-02-25 | Discharge: 2013-02-27 | DRG: 312 | Disposition: A | Discharge status: Home / Self Care | Payer: Medicare Other | Attending: Internal Medicine | Admitting: Internal Medicine

## 2013-02-25 ENCOUNTER — Emergency Department (HOSPITAL_COMMUNITY): Payer: Medicare Other

## 2013-02-25 ENCOUNTER — Encounter (HOSPITAL_COMMUNITY): Payer: Self-pay | Admitting: Emergency Medicine

## 2013-02-25 ENCOUNTER — Inpatient Hospital Stay (HOSPITAL_COMMUNITY): Payer: Medicare Other

## 2013-02-25 DIAGNOSIS — R413 Other amnesia: Secondary | ICD-10-CM

## 2013-02-25 DIAGNOSIS — G911 Obstructive hydrocephalus: Secondary | ICD-10-CM

## 2013-02-25 DIAGNOSIS — M129 Arthropathy, unspecified: Secondary | ICD-10-CM | POA: Diagnosis present

## 2013-02-25 DIAGNOSIS — E785 Hyperlipidemia, unspecified: Secondary | ICD-10-CM

## 2013-02-25 DIAGNOSIS — Z87891 Personal history of nicotine dependence: Secondary | ICD-10-CM

## 2013-02-25 DIAGNOSIS — R55 Syncope and collapse: Principal | ICD-10-CM

## 2013-02-25 DIAGNOSIS — F0390 Unspecified dementia without behavioral disturbance: Secondary | ICD-10-CM | POA: Diagnosis present

## 2013-02-25 DIAGNOSIS — H8110 Benign paroxysmal vertigo, unspecified ear: Secondary | ICD-10-CM

## 2013-02-25 DIAGNOSIS — I10 Essential (primary) hypertension: Secondary | ICD-10-CM

## 2013-02-25 DIAGNOSIS — R269 Unspecified abnormalities of gait and mobility: Secondary | ICD-10-CM

## 2013-02-25 HISTORY — DX: Unspecified asthma, uncomplicated: J45.909

## 2013-02-25 HISTORY — DX: Adverse effect of unspecified anesthetic, initial encounter: T41.45XA

## 2013-02-25 HISTORY — DX: Other complications of anesthesia, initial encounter: T88.59XA

## 2013-02-25 HISTORY — DX: Leukoplakia of oral mucosa, including tongue: K13.21

## 2013-02-25 HISTORY — DX: Unspecified osteoarthritis, unspecified site: M19.90

## 2013-02-25 HISTORY — DX: Basal cell carcinoma of skin, unspecified: C44.91

## 2013-02-25 LAB — CBC WITH DIFFERENTIAL/PLATELET
Basophils Absolute: 0 10*3/uL (ref 0.0–0.1)
Basophils Relative: 0 % (ref 0–1)
Eosinophils Absolute: 0.1 10*3/uL (ref 0.0–0.7)
Eosinophils Relative: 1 % (ref 0–5)
HCT: 45 % (ref 39.0–52.0)
Hemoglobin: 15.4 g/dL (ref 13.0–17.0)
Lymphocytes Relative: 16 % (ref 12–46)
Lymphs Abs: 1 10*3/uL (ref 0.7–4.0)
MCH: 32.1 pg (ref 26.0–34.0)
MCHC: 34.2 g/dL (ref 30.0–36.0)
MCV: 93.8 fL (ref 78.0–100.0)
Monocytes Absolute: 0.4 10*3/uL (ref 0.1–1.0)
Monocytes Relative: 7 % (ref 3–12)
Neutro Abs: 4.7 10*3/uL (ref 1.7–7.7)
Neutrophils Relative %: 76 % (ref 43–77)
Platelets: 165 10*3/uL (ref 150–400)
RBC: 4.8 MIL/uL (ref 4.22–5.81)
RDW: 13.2 % (ref 11.5–15.5)
WBC: 6.2 10*3/uL (ref 4.0–10.5)

## 2013-02-25 LAB — BASIC METABOLIC PANEL
BUN: 17 mg/dL (ref 6–23)
CO2: 30 mEq/L (ref 19–32)
Calcium: 8.7 mg/dL (ref 8.4–10.5)
Chloride: 106 mEq/L (ref 96–112)
Creatinine, Ser: 0.7 mg/dL (ref 0.50–1.35)
GFR calc Af Amer: >90 mL/min (ref >90)
GFR calc non Af Amer: >90 mL/min (ref >90)
Glucose, Bld: 102 mg/dL — ABNORMAL HIGH (ref 70–99)
Potassium: 3.8 mEq/L (ref 3.5–5.1)
Sodium: 141 mEq/L (ref 135–145)

## 2013-02-25 LAB — POCT I-STAT TROPONIN I: Troponin i, poc: 0 ng/mL (ref 0.00–0.08)

## 2013-02-25 LAB — PROTIME-INR
INR: 1.08 (ref 0.00–1.49)
Prothrombin Time: 13.8 seconds (ref 11.6–15.2)

## 2013-02-25 LAB — URINALYSIS, ROUTINE W REFLEX MICROSCOPIC
Bilirubin Urine: NEGATIVE
Glucose, UA: NEGATIVE mg/dL
Hgb urine dipstick: NEGATIVE
Ketones, ur: NEGATIVE mg/dL
Leukocytes, UA: NEGATIVE
Nitrite: NEGATIVE
Protein, ur: NEGATIVE mg/dL
Specific Gravity, Urine: 1.012 (ref 1.005–1.030)
Urobilinogen, UA: 0.2 mg/dL (ref 0.0–1.0)
pH: 6.5 (ref 5.0–8.0)

## 2013-02-25 LAB — GLUCOSE, CAPILLARY: Glucose-Capillary: 99 mg/dL (ref 70–99)

## 2013-02-25 LAB — TROPONIN I
Troponin I: <0.3 ng/mL (ref <0.30)
Troponin I: <0.3 ng/mL (ref <0.30)

## 2013-02-25 MED ORDER — HYDROCODONE-ACETAMINOPHEN 5-325 MG PO TABS: 1.0000 | ORAL_TABLET | ORAL | Status: DC | PRN

## 2013-02-25 MED ORDER — ONDANSETRON HCL 4 MG PO TABS: 4.0000 mg | ORAL_TABLET | Freq: Four times a day (QID) | ORAL | Status: DC | PRN

## 2013-02-25 MED ORDER — DONEPEZIL HCL 5 MG PO TABS
5.0000 mg | ORAL_TABLET | Freq: Every day | ORAL | Status: DC
  Administered 2013-02-25 – 2013-02-26 (×2): 5 mg via ORAL
  Filled 2013-02-25 (×4): qty 1

## 2013-02-25 MED ORDER — MESALAMINE 400 MG PO CPDR
400.0000 mg | DELAYED_RELEASE_CAPSULE | Freq: Every morning | ORAL | Status: DC
  Administered 2013-02-26: 400 mg via ORAL
  Filled 2013-02-25 (×2): qty 1

## 2013-02-25 MED ORDER — SODIUM CHLORIDE 0.9 % IJ SOLN
3.0000 mL | Freq: Two times a day (BID) | INTRAMUSCULAR | Status: DC
  Administered 2013-02-25: 3 mL via INTRAVENOUS

## 2013-02-25 MED ORDER — ACETAMINOPHEN 500 MG PO TABS
500.0000 mg | ORAL_TABLET | Freq: Two times a day (BID) | ORAL | Status: DC
  Administered 2013-02-25 – 2013-02-27 (×4): 500 mg via ORAL
  Filled 2013-02-25 (×5): qty 1
  Filled 2013-02-25: qty 2
  Filled 2013-02-25: qty 1

## 2013-02-25 MED ORDER — POTASSIUM CHLORIDE IN NACL 20-0.9 MEQ/L-% IV SOLN
INTRAVENOUS | Status: DC
  Administered 2013-02-25: 19:00:00 via INTRAVENOUS
  Filled 2013-02-25 (×2): qty 1000

## 2013-02-25 MED ORDER — GUAIFENESIN-DM 100-10 MG/5ML PO SYRP: 5.0000 mL | ORAL_SOLUTION | ORAL | Status: DC | PRN

## 2013-02-25 MED ORDER — ONDANSETRON HCL 4 MG/2ML IJ SOLN
4.0000 mg | Freq: Once | INTRAMUSCULAR | Status: AC
  Administered 2013-02-25: 4 mg via INTRAVENOUS
  Filled 2013-02-25: qty 2

## 2013-02-25 MED ORDER — ONDANSETRON HCL 4 MG/2ML IJ SOLN: 4.0000 mg | Freq: Four times a day (QID) | INTRAMUSCULAR | Status: DC | PRN

## 2013-02-25 MED ORDER — POLYETHYLENE GLYCOL 3350 17 G PO PACK
17.0000 g | PACK | Freq: Every day | ORAL | Status: DC | PRN
  Filled 2013-02-25: qty 1

## 2013-02-25 MED ORDER — CYCLOBENZAPRINE HCL 10 MG PO TABS
10.0000 mg | ORAL_TABLET | Freq: Every day | ORAL | Status: DC
  Administered 2013-02-25 – 2013-02-26 (×2): 10 mg via ORAL
  Filled 2013-02-25 (×3): qty 1

## 2013-02-25 MED ORDER — IRBESARTAN 150 MG PO TABS
150.0000 mg | ORAL_TABLET | Freq: Every day | ORAL | Status: DC
  Administered 2013-02-26: 150 mg via ORAL
  Filled 2013-02-25 (×2): qty 1

## 2013-02-25 MED ORDER — SODIUM CHLORIDE 0.9 % IV BOLUS (SEPSIS)
1000.0000 mL | Freq: Once | INTRAVENOUS | Status: AC
  Administered 2013-02-25: 1000 mL via INTRAVENOUS

## 2013-02-25 MED ORDER — ALBUTEROL SULFATE (5 MG/ML) 0.5% IN NEBU: 2.5000 mg | INHALATION_SOLUTION | RESPIRATORY_TRACT | Status: DC | PRN

## 2013-02-25 MED ORDER — CARBIDOPA-LEVODOPA 25-100 MG PO TABS
1.0000 | ORAL_TABLET | Freq: Three times a day (TID) | ORAL | Status: DC
  Administered 2013-02-25 – 2013-02-27 (×6): 1 via ORAL
  Filled 2013-02-25 (×9): qty 1

## 2013-02-25 NOTE — ED Notes (Addendum)
Received pt from home with c/o while eating breakfast, pt began to feel lightheaded and had a syncopal episode. Wife assisted pt to floor, reports pt hit head on floor. Upon arrival of EMS pt was pale and diaphoretic, CBG 111 for EMS, pt NSR on monitor for EMS. Pt c/o nausea while in route to ED, pt given 4mg  of Zofran by EMS.

## 2013-02-25 NOTE — ED Provider Notes (Signed)
Medical screening examination/treatment/procedure(s) were conducted as a shared visit with non-physician practitioner(s) and myself.  I personally evaluated the patient during the encounter  Syncopal episode with LOC Witnessed by wife. No tongue biting or incontinence. Denies CP or SOB. CBG 111. EKG unremarkable. CN 2-12 intact, no ataxia on finger to nose, no nystagmus, 5/5 strength throughout, no pronator drift, Romberg negative, normal gait.   Glynn Octave, MD 02/25/13 1859

## 2013-02-25 NOTE — Progress Notes (Signed)
eeg completed ° °

## 2013-02-25 NOTE — ED Notes (Signed)
Report given but pt still having EEG.

## 2013-02-25 NOTE — ED Provider Notes (Signed)
History    CSN: [id number] Arrival date & time 02/25/13  1026  First MD Initiated Contact with Patient 02/25/13 1029     Chief Complaint  Patient presents with  . Loss of Consciousness   (Consider location/radiation/quality/duration/timing/severity/associated sxs/prior Treatment) HPI  72 year old man with history of BPPV, and abnormalities of gait, and memory loss here for evaluations of a recent syncopal episode.  Patient states this morning when getting up he felt "a bit tired". He proceeds to have breakfast and was eating blueberry pancake when he had an apparent syncopal episode. Episode was witnessed by wife who is able to guide him down to the floor. Episode lasts for less than a minute. Patient denies any confusion afterwards. No precipitating sxs.  He felt nauseous, and wife report that patient appears pale and diaphoretic. EMS was called and patient has a CBG of 111 prior to arrival with normal sinus rhythm on initial EKG. He was given 4mg  Zofran and now felt better. Patient currently denies fever, chills, headache, neck pain, double vision, difficulty thinking, slurring of speech, chest pain, shortness of breath, abdominal pain, back pain, numbness or weakness. He denies any lightheadedness or dizziness. He was his normal self last night. No recent sickness.  No medication changes. No history of seizure  Past Medical History  Diagnosis Date  . Memory loss   . Benign paroxysmal positional vertigo   . Other and unspecified hyperlipidemia   . Unspecified essential hypertension   . Obstructive hydrocephalus   . Abnormality of gait   . Hyperlipidemia    Past Surgical History  Procedure Laterality Date  . Suprapubic prostatectomy  2009   Family History  Problem Relation Age of Onset  . Heart failure Father   . Dementia Mother    History  Substance Use Topics  . Smoking status: Former Smoker    Types: Pipe  . Smokeless tobacco: Not on file  . Alcohol Use: No    Review  of Systems  All other systems reviewed and are negative.    Allergies  Lexapro and Tetracyclines & related  Home Medications   Current Outpatient Rx  Name  Route  Sig  Dispense  Refill  . acetaminophen (TYLENOL) 650 MG CR tablet   Oral   Take 650 mg by mouth every morning.         . carbidopa-levodopa (SINEMET IR) 25-100 MG per tablet   Oral   Take 1 tablet by mouth 3 (three) times daily.   270 tablet   1   . cyclobenzaprine (FLEXERIL) 10 MG tablet   Oral   Take 10 mg by mouth at bedtime.         . donepezil (ARICEPT) 5 MG tablet   Oral   Take 1 tablet (5 mg total) by mouth daily.   90 tablet   1   . mesalamine (APRISO) 0.375 G 24 hr capsule   Oral   Take 375 mg by mouth 4 (four) times daily. Each morning         . valsartan (DIOVAN) 160 MG tablet   Oral   Take 160 mg by mouth daily.          There were no vitals taken for this visit. Physical Exam  Nursing note and vitals reviewed. Constitutional: He is oriented to person, place, and time. He appears well-developed and well-nourished. No distress.  Awake, alert, nontoxic appearance  HENT:  Head: Normocephalic and atraumatic.  No scalp tenderness.  Eyes: Conjunctivae are  normal. Right eye exhibits no discharge. Left eye exhibits no discharge.  Neck: Normal range of motion. Neck supple.  Cardiovascular: Normal rate and regular rhythm.   Murmur (3 out 6 systolic murmur best heard at the second intercostal space on the right side of chest.) heard. Pulmonary/Chest: Effort normal. No respiratory distress. He has no wheezes. He exhibits no tenderness.  Abdominal: Soft. There is no tenderness. There is no rebound.  Musculoskeletal: He exhibits no tenderness.  ROM appears intact, no obvious focal weakness  5 out 5 strength to all 4 extremities  Neurological: He is alert and oriented to person, place, and time. No cranial nerve deficit.  Unsteady gait (pt's baseline)  No facial droops, no slurring of  speech.  Normal finger to nose coordination.    Skin: Skin is warm and dry. No rash noted.  Psychiatric: He has a normal mood and affect.    ED Course  Procedures (including critical care time)   Date: 02/25/2013  Rate: 62  Rhythm: normal sinus rhythm  QRS Axis: normal  Intervals: normal  ST/T Wave abnormalities: normal  Conduction Disutrbances: none  Narrative Interpretation:   Old EKG Reviewed: No significant changes noted     10:49 AM Pt with witnessed syncopal episode, is back at baseline.  Work up initiated.  Care discussed with attending.  Although no medication changes, i questions whether taking carbidopa/levodopa may have caused his sxs.    1:46 PM Normal orthostatic vital sign. ECG unremarkable. Trop neg. Normal CBG. Head CT without acute finding. VSS.  Labs are reassuring.  Pt is mentating appropriately.  However, wife report pt has 2 prior similar episodes of syncope.  Therefore, plan to have pt admitted for further evaluation.    2:02 PM i have consulted [facility], Dr. Thedore Mins who agrees to see pt in ER and will admit for further evaluation of pt's syncope.  Pt and family member agrees with plan.    Labs Reviewed  BASIC METABOLIC PANEL - Abnormal; Notable for the following:    Glucose, Bld 102 (*)    All other components within normal limits  CBC WITH DIFFERENTIAL  GLUCOSE, CAPILLARY  URINALYSIS, ROUTINE W REFLEX MICROSCOPIC  TROPONIN I  TROPONIN I  TROPONIN I  POCT I-STAT TROPONIN I   Ct Head Wo Contrast  02/25/2013   *RADIOLOGY REPORT*  Clinical Data: Syncope.  CT HEAD WITHOUT CONTRAST  Technique:  Contiguous axial images were obtained from the base of the skull through the vertex without contrast.  Comparison: 05/21/2011  Findings: There is dilatation of the lateral ventricles and the third ventricle.  The fourth ventricle has a relatively normal size.  The ventricular dilatation is similar to previous exam and is likely a chronic abnormality.There is  prominence of the sulci and ventricles consistent with brain atrophy.  There is no evidence for acute brain infarct, hemorrhage or mass.  The paranasal sinuses are clear.  The mastoid air cells are clear. The skull is intact.  IMPRESSION:  1.  No acute intracranial abnormalities. 2.  Brain atrophy. 3.  Similar appearance of chronic hydrocephalus.   Original Report Authenticated By: Signa Kell, M.D.   1. Memory loss   2. Benign paroxysmal positional vertigo   3. Unspecified essential hypertension   4. Other and unspecified hyperlipidemia   5. Abnormality of gait   6. Obstructive hydrocephalus   7. Gait disorder   8. Syncope and collapse     MDM  BP 141/73  Pulse 65  Temp(Src) 97.8  F (36.6 C) (Oral)  Resp 18  Ht 5\' 7"  (1.702 m)  Wt 158 lb (71.668 kg)  BMI 24.74 kg/m2  SpO2 100%  I have reviewed nursing notes and vital signs. I personally reviewed the imaging tests through PACS system  I reviewed available ER/hospitalization records thought the EMR   Fayrene Helper, PA-C 02/25/13 1406  Fayrene Helper, PA-C 02/25/13 1533

## 2013-02-25 NOTE — ED Notes (Signed)
Regular diet tray ordered

## 2013-02-25 NOTE — ED Notes (Signed)
Toileting offered but pt unable to void 

## 2013-02-25 NOTE — ED Notes (Signed)
Bed available in 2000 but pt is having EEG  Done.

## 2013-02-25 NOTE — ED Notes (Signed)
EEG being done by tech.

## 2013-02-25 NOTE — H&P (Signed)
[facility]                                                                                    [name], is a 72 y.o. male  CSN: [id number]  MRN: [id number]  DOB - Nov 16, 1940  Admit Date - 02/25/2013  Outpatient Primary MD for the patient is Gregory Hector, MD Neurologist Dr. love.  With History of -  Past Medical History  Diagnosis Date  . Memory loss   . Benign paroxysmal positional vertigo   . Other and unspecified hyperlipidemia   . Unspecified essential hypertension   . Obstructive hydrocephalus   . Abnormality of gait   . Hyperlipidemia       Past Surgical History  Procedure Laterality Date  . Suprapubic prostatectomy  2009    in for   Chief Complaint  Patient presents with  . Loss of Consciousness     HPI  [name]  is a 72 y.o. 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 He was brought in the ER where his lab work, orthostatic vital signs, EKG and CT scan were all unremarkable including unremarkable troponin. I was called to admit the patient for syncope workup. He's currently completely symptom free.    Review of Systems  Currently negative   In addition to the HPI above,   No Fever-chills, No Headache, No changes with Vision or hearing, No problems swallowing food or Liquids, No Chest pain, Cough or Shortness of  Breath, No Abdominal pain, No Nausea or Vommitting, Bowel movements are regular, No Blood in stool or Urine, No dysuria, No new skin rashes or bruises, No new joints pains-aches,  No new weakness, tingling, numbness in any extremity, No recent weight gain or loss, No polyuria, polydypsia or polyphagia, No significant Mental Stressors.  A full 10 point Review of Systems was done, except as stated above, all other Review of Systems were negative.   Social History History  Substance Use Topics  . Smoking status: Former Smoker    Types: Pipe  . Smokeless tobacco: Not on file  . Alcohol Use: No      Family History Family History  Problem Relation Age of Onset  . Heart failure Father   . Dementia Mother       Prior to Admission medications   Medication Sig Start Date End Date Taking? Authorizing Provider  acetaminophen (TYLENOL) 500 MG tablet Take 500 mg by mouth 2 (two) times daily.   Yes Historical Provider, MD  carbidopa-levodopa (SINEMET IR) 25-100 MG per tablet Take 1 tablet by  mouth 3 (three) times daily. 01/04/13  Yes Huston Foley, MD  cyclobenzaprine (FLEXERIL) 10 MG tablet Take 10 mg by mouth at bedtime.   Yes Historical Provider, MD  donepezil (ARICEPT) 5 MG tablet Take 1 tablet (5 mg total) by mouth daily. 01/04/13  Yes Huston Foley, MD  mesalamine (APRISO) 0.375 G 24 hr capsule Take 375 mg by mouth every morning. Each morning   Yes Historical Provider, MD  valsartan (DIOVAN) 160 MG tablet Take 160 mg by mouth daily.   Yes Historical Provider, MD    Allergies  Allergen Reactions  . Lexapro (Escitalopram Oxalate) Other (See Comments)    Unknown: reaction occurred some time ago and pt cannot remember what it was  . Tetracyclines & Related Other (See Comments)    Unknown: Reaction occurred some time ago and pt cannot remember what it was     Physical Exam  Vitals  Blood pressure 141/73, pulse 65, temperature 97.8 F (36.6 C), temperature source Oral, resp. rate  18, height 5\' 7"  (1.702 m), weight 71.668 kg (158 lb), SpO2 100.00%.   1. General quite elderly male lying in bed in NAD,    2. Normal affect and insight, Not Suicidal or Homicidal, Awake Alert, Oriented X 3.  3. No F.N deficits, ALL C.Nerves Intact, Strength 5/5 all 4 extremities, Sensation intact all 4 extremities, Plantars down going.  4. Ears and Eyes appear Normal, Conjunctivae clear, PERRLA. Moist Oral Mucosa.  5. Supple Neck, No JVD, No cervical lymphadenopathy appriciated, No Carotid Bruits.  6. Symmetrical Chest wall movement, Good air movement bilaterally, CTAB.  7. RRR, No Gallops, Rubs , soft systolic aortic valve area Murmur, No Parasternal Heave.  8. Positive Bowel Sounds, Abdomen Soft, Non tender, No organomegaly appriciated,No rebound -guarding or rigidity.  9.  No Cyanosis, Normal Skin Turgor, No Skin Rash or Bruise.  10. Good muscle tone,  joints appear normal , no effusions, Normal ROM.  11. No Palpable Lymph Nodes in Neck or Axillae     Data Review  CBC  Recent Labs Lab 02/25/13 1140  WBC 6.2  HGB 15.4  HCT 45.0  PLT 165  MCV 93.8  MCH 32.1  MCHC 34.2  RDW 13.2  LYMPHSABS 1.0  MONOABS 0.4  EOSABS 0.1  BASOSABS 0.0   ------------------------------------------------------------------------------------------------------------------  Chemistries   Recent Labs Lab 02/25/13 1140  NA 141  K 3.8  CL 106  CO2 30  GLUCOSE 102*  BUN 17  CREATININE 0.70  CALCIUM 8.7   ------------------------------------------------------------------------------------------------------------------ estimated creatinine clearance is 80.3 ml/min (by C-G formula based on Cr of 0.7). ------------------------------------------------------------------------------------------------------------------ No results found for this basename: TSH, T4TOTAL, FREET3, T3FREE, THYROIDAB,  in the last 72 hours   Coagulation profile No results found for this basename: INR,  PROTIME,  in the last 168 hours ------------------------------------------------------------------------------------------------------------------- No results found for this basename: DDIMER,  in the last 72 hours -------------------------------------------------------------------------------------------------------------------  Cardiac Enzymes No results found for this basename: CK, CKMB, TROPONINI, MYOGLOBIN,  in the last 168 hours ------------------------------------------------------------------------------------------------------------------ No components found with this basename: POCBNP,    ---------------------------------------------------------------------------------------------------------------  Urinalysis No results found for this basename: colorurine,  appearanceur,  labspec,  phurine,  glucoseu,  hgbur,  bilirubinur,  ketonesur,  proteinur,  urobilinogen,  nitrite,  leukocytesur    ----------------------------------------------------------------------------------------------------------------  Imaging results:   Ct Head Wo Contrast  02/25/2013   *RADIOLOGY REPORT*  Clinical Data: Syncope.  CT HEAD WITHOUT CONTRAST  Technique:  Contiguous axial images were obtained from the base of the skull  through the vertex without contrast.  Comparison: 05/21/2011  Findings: There is dilatation of the lateral ventricles and the third ventricle.  The fourth ventricle has a relatively normal size.  The ventricular dilatation is similar to previous exam and is likely a chronic abnormality.There is prominence of the sulci and ventricles consistent with brain atrophy.  There is no evidence for acute brain infarct, hemorrhage or mass.  The paranasal sinuses are clear.  The mastoid air cells are clear. The skull is intact.  IMPRESSION:  1.  No acute intracranial abnormalities. 2.  Brain atrophy. 3.  Similar appearance of chronic hydrocephalus.   Original Report Authenticated By: Signa Kell, M.D.     My personal review of EKG: Rhythm NSR,   no Acute ST changes    Assessment & Plan  1.Syncope and collapse- in a patient with 2 similar episodes 5 and 3 years ago, second episode associated with urinary incontinence, also has a soft systolic aortic valve murmur. He will be admitted to telemetry bed, troponins will be cycled, monitor orthostatics, carotid duplex, EEG and echo gram. If all negative I think he be prudent to have patient follow outpatient with neurology and cardiology.    2. History of hydrocephalus, memory loss and abnormal gait. Continue supportive care, uses walker for ambulation at baseline. Note he takes dopamine for his gait disturbance which has been prescribed by neurologist Dr. Sandria Manly, I do not see a formal diagnosis of parkinsonism.    3. History of BPV. No acute issues. Currently no vertigo or nystagmus.    4. Hypertension. Stable continue home medication Diovan.    DVT Prophylaxis   SCDs    AM Labs Ordered, also please review Full Orders  Family Communication: Admission, patients condition and plan of care including tests being ordered have been discussed with the patient and wife who indicate understanding and agree with the plan and Code Status.  Code Status Full  Likely DC to  Home  Time spent in minutes : 35  Condition [name] K M.D on 02/25/2013 at 2:21 PM  Between 7am to 7pm - Pager - [phone]  After 7pm go to [email or web address] - password [email or web address]  And look for the night coverage person covering me after hours  [facility] Office  [phone]

## 2013-02-26 DIAGNOSIS — R55 Syncope and collapse: Secondary | ICD-10-CM

## 2013-02-26 LAB — BASIC METABOLIC PANEL
BUN: 12 mg/dL (ref 6–23)
CO2: 27 mEq/L (ref 19–32)
Calcium: 8.9 mg/dL (ref 8.4–10.5)
Chloride: 103 mEq/L (ref 96–112)
Creatinine, Ser: 0.54 mg/dL (ref 0.50–1.35)
GFR calc Af Amer: >90 mL/min (ref >90)
GFR calc non Af Amer: >90 mL/min (ref >90)
Glucose, Bld: 98 mg/dL (ref 70–99)
Potassium: 3.8 mEq/L (ref 3.5–5.1)
Sodium: 136 mEq/L (ref 135–145)

## 2013-02-26 LAB — CBC
HCT: 39.9 % (ref 39.0–52.0)
Hemoglobin: 13.7 g/dL (ref 13.0–17.0)
MCH: 31.7 pg (ref 26.0–34.0)
MCHC: 34.3 g/dL (ref 30.0–36.0)
MCV: 92.4 fL (ref 78.0–100.0)
Platelets: 162 10*3/uL (ref 150–400)
RBC: 4.32 MIL/uL (ref 4.22–5.81)
RDW: 13.2 % (ref 11.5–15.5)
WBC: 6.6 10*3/uL (ref 4.0–10.5)

## 2013-02-26 LAB — TSH: TSH: 0.507 u[IU]/mL (ref 0.350–4.500)

## 2013-02-26 LAB — TROPONIN I: Troponin I: <0.3 ng/mL (ref <0.30)

## 2013-02-26 MED ORDER — METOPROLOL TARTRATE 25 MG PO TABS
25.0000 mg | ORAL_TABLET | Freq: Two times a day (BID) | ORAL | Status: DC
  Administered 2013-02-26 – 2013-02-27 (×3): 25 mg via ORAL
  Filled 2013-02-26 (×4): qty 1

## 2013-02-26 NOTE — Progress Notes (Signed)
  Echocardiogram 2D Echocardiogram has been performed.  Georgian Co 02/26/2013, 11:36 AM

## 2013-02-26 NOTE — Progress Notes (Signed)
VASCULAR LAB PRELIMINARY  PRELIMINARY  PRELIMINARY  PRELIMINARY  Carotid Dopplers completed.    Preliminary report:  There is <39% ICA stenosis.  Vertebral artery flow is antegrade.  Akeylah Hendel, RVT 02/26/2013, 6:29 PM

## 2013-02-26 NOTE — Progress Notes (Signed)
[facility]                                                                                [name], is a 72 y.o. male, DOB - [birth year], ZOX:[id number], WJX:[id number]  Admit date - 02/25/2013  Admitting Physician Leroy Sea, MD  Outpatient Primary MD for the patient is Josue Hector, MD  LOS - 1   Chief Complaint  Patient presents with  . Loss of Consciousness        Assessment & Plan    1.Syncope and collapse- in a patient with 2 similar episodes 5 and 3 years ago, second episode associated with urinary incontinence, also has a soft systolic aortic valve murmur- he remains stable on telemetry bed, troponins cycled are negative, negative orthostatics, await results of carotid duplex, EEG and echo gram. If all negative I think he be prudent to have patient follow outpatient with neurology and cardiology.     2. History of hydrocephalus, memory loss and abnormal gait. Continue supportive care, uses walker for ambulation at baseline. Note he takes dopamine for his gait disturbance which has been prescribed by neurologist Dr. Sandria Manly, I do not see a formal diagnosis of parkinsonism.      3. History of BPV. No acute issues. Currently no vertigo or nystagmus.      4. Hypertension. continue home medication Diovan, will add Lopressor for better control     Code Status: Full  Family Communication: Wife  Disposition Plan: Home   Procedures CT scan of the head, echogram, carotid duplex, EEG   Consults     DVT Prophylaxis    SCDs    Lab Results  Component Value Date   PLT 162 02/26/2013    Medications  Scheduled Meds: . acetaminophen  500 mg Oral BID  . carbidopa-levodopa  1 tablet Oral TID  . cyclobenzaprine  10 mg Oral QHS  . donepezil  5 mg Oral Daily  . irbesartan  150 mg Oral Daily  . Mesalamine  400 mg Oral q morning - 10a  . metoprolol tartrate  25 mg Oral BID  . sodium chloride  3 mL Intravenous Q12H    Continuous Infusions:  PRN Meds:.albuterol, guaiFENesin-dextromethorphan, HYDROcodone-acetaminophen, ondansetron (ZOFRAN) IV, ondansetron, polyethylene glycol  Antibiotics     Anti-infectives   None       Time Spent in minutes   35   Susa Raring K M.D on 02/26/2013 at 9:58 AM  Between 7am to 7pm - Pager - [phone]  After 7pm go to [email or web address] - password [email or web address]  And look for the night coverage person covering for me after hours  [facility] Office  [phone]    Subjective:   [name] today has, No headache, No chest pain, No abdominal pain - No Nausea, No new weakness tingling or numbness, No Cough - SOB.    Objective:   Filed Vitals:   02/25/13 1818 02/25/13 2000 02/26/13 0458 02/26/13 0507  BP: 152/83 145/83 144/85 146/93  Pulse:   73 74  Temp:  98.1 F (36.7 C) 98.5 F (36.9 C)   TempSrc:      Resp:  18 18   Height:  Weight:    72.757 kg (160 lb 6.4 oz)  SpO2:  96% 96%     Wt Readings from Last 3 Encounters:  02/26/13 72.757 kg (160 lb 6.4 oz)  02/04/13 73.483 kg (162 lb)     Intake/Output Summary (Last 24 hours) at 02/26/13 0958 Last data filed at 02/26/13 0655  Gross per 24 hour  Intake      0 ml  Output    775 ml  Net   -775 ml    Exam Awake Alert, Oriented X 3, No new F.N deficits, Normal affect [place].AT,PERRAL Supple Neck,No JVD, No cervical lymphadenopathy appriciated.  Symmetrical Chest wall movement, Good air movement bilaterally, CTAB RRR,No Gallops,Rubs or new Murmurs, No Parasternal Heave +ve B.Sounds, Abd Soft, Non tender, No organomegaly appriciated, No rebound - guarding or rigidity. No Cyanosis, Clubbing or edema, No new Rash or bruise      Data Review   Micro Results No results found for this or any previous visit (from the past 240 hour(s)).  Radiology Reports Ct Head Wo Contrast  02/25/2013   *RADIOLOGY REPORT*  Clinical Data: Syncope.  CT HEAD WITHOUT CONTRAST  Technique:  Contiguous axial  images were obtained from the base of the skull through the vertex without contrast.  Comparison: 05/21/2011  Findings: There is dilatation of the lateral ventricles and the third ventricle.  The fourth ventricle has a relatively normal size.  The ventricular dilatation is similar to previous exam and is likely a chronic abnormality.There is prominence of the sulci and ventricles consistent with brain atrophy.  There is no evidence for acute brain infarct, hemorrhage or mass.  The paranasal sinuses are clear.  The mastoid air cells are clear. The skull is intact.  IMPRESSION:  1.  No acute intracranial abnormalities. 2.  Brain atrophy. 3.  Similar appearance of chronic hydrocephalus.   Original Report Authenticated By: Signa Kell, M.D.    CBC  Recent Labs Lab 02/25/13 1140 02/26/13 0208  WBC 6.2 6.6  HGB 15.4 13.7  HCT 45.0 39.9  PLT 165 162  MCV 93.8 92.4  MCH 32.1 31.7  MCHC 34.2 34.3  RDW 13.2 13.2  LYMPHSABS 1.0  --   MONOABS 0.4  --   EOSABS 0.1  --   BASOSABS 0.0  --     Chemistries   Recent Labs Lab 02/25/13 1140 02/26/13 0208  NA 141 136  K 3.8 3.8  CL 106 103  CO2 30 27  GLUCOSE 102* 98  BUN 17 12  CREATININE 0.70 0.54  CALCIUM 8.7 8.9   ------------------------------------------------------------------------------------------------------------------ estimated creatinine clearance is 80.3 ml/min (by C-G formula based on Cr of 0.54). ------------------------------------------------------------------------------------------------------------------ No results found for this basename: HGBA1C,  in the last 72 hours ------------------------------------------------------------------------------------------------------------------ No results found for this basename: CHOL, HDL, LDLCALC, TRIG, CHOLHDL, LDLDIRECT,  in the last 72 hours ------------------------------------------------------------------------------------------------------------------  Recent Labs   02/25/13 1755  TSH 0.507   ------------------------------------------------------------------------------------------------------------------ No results found for this basename: VITAMINB12, FOLATE, FERRITIN, TIBC, IRON, RETICCTPCT,  in the last 72 hours  Coagulation profile  Recent Labs Lab 02/25/13 1755  INR 1.08    No results found for this basename: DDIMER,  in the last 72 hours  Cardiac Enzymes  Recent Labs Lab 02/25/13 1438 02/25/13 2058 02/26/13 0208  TROPONINI <0.30 <0.30 <0.30   ------------------------------------------------------------------------------------------------------------------ No components found with this basename: POCBNP,

## 2013-02-26 NOTE — Evaluation (Signed)
Physical Therapy Evaluation Patient Details Name: [name] MRN: [id number] DOB: 1941/03/01 Today's Date: 02/26/2013 Time: 8841-6606 PT Time Calculation (min): 25 min  PT Assessment / Plan / Recommendation History of Present Illness    72 year old man with history of BPPV, and abnormalities of gait, and memory loss here for evaluations of a recent syncopal episode.    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 Patient currently ambulating at baseline mobility. Pt very aware of gait dysfunction and manages well with no noted loss of balance. Do not feel patient requires acute PT at this time. Patient in agreement, will sign off from acute services at this time.    PT Assessment  Patent does not need any further PT services    Follow Up Recommendations  No PT follow up          Equipment Recommendations  None recommended by PT          Precautions / Restrictions     Pertinent Vitals/Pain No pain and symptoms noted at this time      Mobility  Bed Mobility Bed Mobility: Supine to Sit;Sitting - Scoot to Edge of Bed Supine to Sit: 6: Modified independent (Device/Increase time) Sitting - Scoot to Edge of Bed: 6: Modified independent (Device/Increase time) Details for Bed Mobility Assistance: increased time  Transfers Transfers: Sit to Stand;Stand to Sit Sit to Stand: 6: Modified independent (Device/Increase time) Stand to Sit: 6: Modified independent (Device/Increase time) Details for Transfer Assistance: increased time to perform but able to do so without assist of any kind Ambulation/Gait Ambulation/Gait Assistance: 6: Modified independent (Device/Increase time) Ambulation Distance (Feet): 300 Feet Assistive device: Rolling walker Ambulation/Gait Assistance Details: no physical assist needed  Gait Pattern: Step-through pattern;Festinating;Narrow base of support (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       PT Goals(Current goals can be found in the care plan section) Acute Rehab PT Goals Patient Stated Goal: to go home PT Goal Formulation: No goals set, d/c therapy  Visit Information  Last PT Received On: 02/26/13 Assistance Needed: +1       Prior Functioning  Home Living Family/patient expects to be discharged to:: Private residence Living Arrangements: Spouse/significant other Available Help at Discharge: Family Type of Home: House Home Access: Stairs to enter Secretary/administrator of Steps: 2 Entrance Stairs-Rails: Right;Left Home Layout: One level Prior Function Level of Independence: Independent with assistive device(s) Communication Communication: No difficulties Dominant Hand: Right    Cognition  Cognition Arousal/Alertness: Awake/alert Behavior During Therapy: WFL for tasks assessed/performed Overall Cognitive Status: Within Functional Limits for tasks assessed    Extremity/Trunk Assessment Upper Extremity Assessment Upper Extremity Assessment: Overall WFL for tasks assessed Lower Extremity Assessment Lower Extremity Assessment: Overall WFL for tasks assessed   Balance Balance Balance Assessed: Yes Standardized Balance Assessment Standardized Balance Assessment:  (Gait dysfuntion noted at baseline secondary to PD ) Dynamic Gait Index Level Surface: Normal Change in Gait Speed: Normal Gait with Horizontal Head Turns: Normal Gait with Vertical Head Turns: Normal Gait and Pivot Turn: Normal Step Over Obstacle: Mild Impairment Step Around Obstacles: Mild Impairment Steps: Mild Impairment Total Score: 21 High Level Balance High Level Balance Activites: Side stepping;Backward walking;Direction changes;Turns;Head turns High Level Balance Comments: steady despite gait dysfunction  End of Session PT - End of Session Equipment  Utilized During Treatment: Gait belt Activity Tolerance:  Patient tolerated treatment well Patient left: in chair;with call bell/phone within reach Nurse Communication: Mobility status  GP     Fabio Asa 02/26/2013, 9:40 AM Charlotte Crumb, PT DPT  [phone]

## 2013-02-27 DIAGNOSIS — R413 Other amnesia: Secondary | ICD-10-CM

## 2013-02-27 MED ORDER — METOPROLOL TARTRATE 25 MG PO TABS: 25.0000 mg | ORAL_TABLET | Freq: Two times a day (BID) | ORAL | Status: DC

## 2013-02-27 NOTE — Procedures (Signed)
EEG report.  Brief clinical history:  72 y.o. 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 No prior history of frank epileptic seizures.  Technique: this is a 17 channel routine scalp EEG performed at the bedside with bipolar and monopolar montages arranged in accordance to the international 10/20 system of electrode placement. One channel was dedicated to EKG recording.  The study was performed during wakefulness and drowsiness. Hyperventilation and intermittent photic stimulation were utilized as activating procedures.  Description:In the wakeful state, the best background consisted of a medium amplitude, posterior dominant, well sustained, symmetric and reactive 10 Hz rhythm. Drowsiness demonstrated dropout of the alpha rhythm. Hyperventilation induced mild, diffuse, physiologic slowing but no epileptiform discharges. Intermittent photic stimulation did induce a normal driving response.  No focal or generalized epileptiform discharges noted.  No pathologic areas of slowing seen.  EKG showed sinus rhythm.  Impression: this is a normal awake and drowsy EEG. Please, be aware that a normal EEG does not exclude the possibility of epilepsy.  Clinical correlation is advised.  Wyatt Portela, MD

## 2013-02-27 NOTE — Discharge Summary (Signed)
[facility]                                                                                   [name], is a 72 y.o. male  DOB March 02, 1941  MRN [id number].  Admission date:  02/25/2013  Discharge Date:  02/27/2013  Primary MD  Josue Hector, MD  Admitting Physician  Leroy Sea, MD  Admission Diagnosis  Other and unspecified hyperlipidemia [272.4] Obstructive hydrocephalus [331.4] Benign paroxysmal positional vertigo [386.11] Unspecified essential hypertension [401.9] Syncope and collapse [780.2] Abnormality of gait [781.2] Memory loss [780.93] Gait disorder [781.2]  Discharge Diagnosis     Principal Problem:   Syncope and collapse Active Problems:   Gait disorder   Memory loss   Benign paroxysmal positional vertigo   Unspecified essential hypertension   Other and unspecified hyperlipidemia   Abnormality of gait   Obstructive hydrocephalus    Past Medical History  Diagnosis Date  . Memory loss   . Benign paroxysmal positional vertigo   . Other and unspecified hyperlipidemia   . Unspecified essential hypertension   . Obstructive hydrocephalus   . Abnormality of gait   . Hyperlipidemia   . Basal cell carcinoma     "primarily on my face; I've had a bunch" (02/25/2013)  . 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 Aqueductal stenosis   . Asthma     "I've outgrown that now; I was on allergy shots for awhile" (02/25/2013)  . Arthritis     "left knee" (02/25/2013)  . 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 Suprapubic prostatectomy  2009  . Basal cell carcinoma excision Left 2000's    points to nasal fold  (02/25/2013)     Recommendations for primary care physician for things to follow:       Discharge Diagnoses:   Principal Problem:   Syncope and collapse Active Problems:   Gait disorder   Memory loss    Benign paroxysmal positional vertigo   Unspecified essential hypertension   Other and unspecified hyperlipidemia   Abnormality of gait   Obstructive hydrocephalus    Discharge Condition: stable   Diet recommendation: See Discharge Instructions below   Consults      History of present illness and  Hospital Course:     Kindly see H&P for history of present illness and admission details, please review complete Labs, Consult reports and Test reports for all details in brief [name], is a 72 y.o. male, patient with history of hydrocephalus, chronic gait problems, history of benign positional vertigo, hypertension, was admitted few days ago with chief complaints of syncope and collapse, etiology remains unclear but patient has had 2 similar episodes in the past over the last 4 years, of note his last syncopal episode which was 3 years ago was associated with some urinary incontinence, the previous 2 episodes were never worked up in the hospital. This episode was not accompanied by any nystagmus or vertiginous symptoms.   Patient was admitted here on telemetry bed without  any dysrhythmias, he underwent EEG, carotid duplex which were unremarkable, echo showed mild aortic stenosis and trivial AR, TR and MR, EF was preserved at 55% with no wall motion abnormalities. He ruled out for MI with serial negative troponins, he is currently completely symptom free ambulating without any dizzy spells. He will be now discharged home with the addition of Lopressor to his home medications for better blood pressure control.  He will follow with cardiology and neurology post discharge for further workup of the syncope, and his mild AS.    Today   Subjective:   [name] today has no headache,no chest abdominal pain,no new weakness tingling or numbness, feels much better wants to go home today.    Objective:   Blood pressure 128/87, pulse 62, temperature 97.4 F (36.3 C), temperature source  Oral, resp. rate 19, height 5\' 7"  (1.702 m), weight 71.532 kg (157 lb 11.2 oz), SpO2 95.00%.   Intake/Output Summary (Last 24 hours) at 02/27/13 0852 Last data filed at 02/26/13 1300  Gross per 24 hour  Intake    240 ml  Output    251 ml  Net    -11 ml    Exam Awake Alert, Oriented *3, No new F.N deficits, Normal affect [place].AT,PERRAL Supple Neck,No JVD, No cervical lymphadenopathy appriciated.  Symmetrical Chest wall movement, Good air movement bilaterally, CTAB RRR,No Gallops,Rubs or new Murmurs, No Parasternal Heave +ve B.Sounds, Abd Soft, Non tender, No organomegaly appriciated, No rebound -guarding or rigidity. No Cyanosis, Clubbing or edema, No new Rash or bruise  Data Review   Major procedures and Radiology Reports - PLEASE review detailed and final reports for all details in brief -   Echo  - Left ventricle: Systolic function was normal. The estimated ejection fraction was in the range of 55% to 60%. Left ventricular diastolic function parameters were normal. - Aortic valve: Moderately thickened with mild calcification, with slightly reduced excursion. Velocity and gradients were not calculated, but stenosis appears to be mild. Mild regurgitation. - Mitral valve: Mildly thickened leaflets . Mild regurgitation. - Tricuspid valve: Mild regurgitation.    Carotids  Summary: Right: mild to moderate soft plaque CCA and origin ICA. Left: minimal plaque noted. Bilateral: 0-39% ICA stenosis. Vertebral artery flow is antegrade. ICA/CCA ratio: R-1.0 L-0.88    EEG  Impression: this is a normal awake and drowsy EEG. Please, be aware that a normal EEG does not exclude the possibility of epilepsy.     Ct Head Wo Contrast  02/25/2013   *RADIOLOGY REPORT*  Clinical Data: Syncope.  CT HEAD WITHOUT CONTRAST  Technique:  Contiguous axial images were obtained from the base of the skull through the vertex without contrast.  Comparison: 05/21/2011  Findings: There is  dilatation of the lateral ventricles and the third ventricle.  The fourth ventricle has a relatively normal size.  The ventricular dilatation is similar to previous exam and is likely a chronic abnormality.There is prominence of the sulci and ventricles consistent with brain atrophy.  There is no evidence for acute brain infarct, hemorrhage or mass.  The paranasal sinuses are clear.  The mastoid air cells are clear. The skull is intact.  IMPRESSION:  1.  No acute intracranial abnormalities. 2.  Brain atrophy. 3.  Similar appearance of chronic hydrocephalus.   Original Report Authenticated By: Signa Kell, M.D.    Micro Results      No results found for this or any previous visit (from the past 240 hour(s)).   CBC w Diff:  Lab Results  Component Value Date   WBC 6.6 02/26/2013   HGB 13.7 02/26/2013   HCT 39.9 02/26/2013   PLT 162 02/26/2013   LYMPHOPCT 16 02/25/2013   MONOPCT 7 02/25/2013   EOSPCT 1 02/25/2013   BASOPCT 0 02/25/2013    CMP: Lab Results  Component Value Date   NA 136 02/26/2013   K 3.8 02/26/2013   CL 103 02/26/2013   CO2 27 02/26/2013   BUN 12 02/26/2013   CREATININE 0.54 02/26/2013  .   Discharge Instructions     Follow with Primary MD Josue Hector, MD in 3 days   Get CBC, CMP, checked 3 days by Primary MD and again as instructed by your Primary MD.    Get Medicines reviewed and adjusted.  Please request your Prim.MD to go over all Hospital Tests and Procedure/Radiological results at the follow up, please get all Hospital records sent to your Prim MD by signing hospital release before you go home.  Activity: As tolerated with Full fall precautions use walker/cane & assistance as needed   Diet:  Heart Healthy.  For Heart failure patients - Check your Weight same time everyday, if you gain over 2 pounds, or you develop in leg swelling, experience more shortness of breath or chest pain, call your Primary MD immediately. Follow Cardiac Low Salt Diet and 1.8  lit/day fluid restriction.  Disposition Home   If you experience worsening of your admission symptoms, develop shortness of breath, life threatening emergency, suicidal or homicidal thoughts you must seek medical attention immediately by calling 911 or calling your MD immediately  if symptoms less severe.  You Must read complete instructions/literature along with all the possible adverse reactions/side effects for all the Medicines you take and that have been prescribed to you. Take any new Medicines after you have completely understood and accpet all the possible adverse reactions/side effects.   Do not drive and provide baby sitting services until you see your Neurologist and advised to do so again.  Do not drive when taking Pain medications.    Do not take more than prescribed Pain, Sleep and Anxiety Medications  Special Instructions: If you have smoked or chewed Tobacco  in the last 2 yrs please stop smoking, stop any regular Alcohol  and or any Recreational drug use.  Wear Seat belts while driving.   Please note  You were cared for by a hospitalist during your hospital stay. If you have any questions about your discharge medications or the care you received while you were in the hospital after you are discharged, you can call the unit and asked to speak with the hospitalist on call if the hospitalist that took care of you is not available. Once you are discharged, your primary care physician will handle any further medical issues. Please note that NO REFILLS for any discharge medications will be authorized once you are discharged, as it is imperative that you return to your primary care physician (or establish a relationship with a primary care physician if you do not have one) for your aftercare needs so that they can reassess your need for medications and monitor your lab values.     Follow-up Information   Follow up with Josue Hector, MD. Schedule an appointment as soon  as possible for a visit in 3 days.   Contact information:   [address] [facility] [address] [phone]       Follow up with Verne Carrow, MD. Schedule an appointment as soon  as possible for a visit in 1 week.   Contact information:   [address] [place] [address] [phone]       Follow up with [facility]. Schedule an appointment as soon as possible for a visit in 3 days.   Contact information:   [address]     [address] [place] [address] [phone]        Discharge Medications     Medication List         acetaminophen 500 MG tablet  Commonly known as:  TYLENOL  Take 500 mg by mouth 2 (two) times daily.     carbidopa-levodopa 25-100 MG per tablet  Commonly known as:  SINEMET IR  Take 1 tablet by mouth 3 (three) times daily.     cyclobenzaprine 10 MG tablet  Commonly known as:  FLEXERIL  Take 10 mg by mouth at bedtime.     donepezil 5 MG tablet  Commonly known as:  ARICEPT  Take 1 tablet (5 mg total) by mouth daily.     mesalamine 0.375 G 24 hr capsule  Commonly known as:  APRISO  Take 375 mg by mouth every morning. Each morning     metoprolol tartrate 25 MG tablet  Commonly known as:  LOPRESSOR  Take 1 tablet (25 mg total) by mouth 2 (two) times daily.     valsartan 160 MG tablet  Commonly known as:  DIOVAN  Take 160 mg by mouth daily.           Total Time in preparing paper work, data evaluation and todays exam - 35 minutes  Leroy Sea M.D on 02/27/2013 at 8:52 AM  [facility] Office  [phone]

## 2013-02-28 ENCOUNTER — Telehealth: Payer: Self-pay | Admitting: Cardiovascular Disease

## 2013-02-28 NOTE — Telephone Encounter (Signed)
Yes,esp since scott is going to be out.

## 2013-02-28 NOTE — Telephone Encounter (Signed)
New Prob    Per ED notes, pt needs to be seen in 1 week with Dr. Clifton James. No availability until September. Should pt be worked in on DOD schedule?

## 2013-03-03 ENCOUNTER — Encounter: Payer: Self-pay | Admitting: Neurology

## 2013-03-03 ENCOUNTER — Ambulatory Visit (INDEPENDENT_AMBULATORY_CARE_PROVIDER_SITE_OTHER): Payer: Medicare Other | Admitting: Neurology

## 2013-03-03 VITALS — BP 116/69 | HR 68 | Temp 97.2°F | Ht 66.5 in

## 2013-03-03 DIAGNOSIS — R269 Unspecified abnormalities of gait and mobility: Secondary | ICD-10-CM

## 2013-03-03 DIAGNOSIS — R55 Syncope and collapse: Secondary | ICD-10-CM

## 2013-03-03 DIAGNOSIS — G911 Obstructive hydrocephalus: Secondary | ICD-10-CM

## 2013-03-03 NOTE — Progress Notes (Signed)
Subjective:    Patient ID: [name] is a 72 y.o. male.  HPI  Interim history:   [name] is a very pleasant 72 year old gentleman who presents for followup consultation after a recent hospitalization. I first met him on 02/04/2013 with a history of parkinsonism and gait disorder. He is accompanied again by his wife. He used to follow with Dr. Avie Echevaria and was last seen by him on 10/05/2012, and which time Dr. Sandria Manly felt that his gait was improved on levodopa therapy. 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 He started having difficulty with his walking after prostate surgery in 2009. 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 He had an EEG in December 2011 showing transient slowing in the right central temporal region. He has had memory loss. 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 He had been doing well on levodopa therapy and I kept him on the same dose. I suggested a six-month followup. The patient was admitted to [facility] on 02/25/2013 and discharged on 02/27/2013 due to syncope and collapse.. The patient has had 2 similar episodes in the past within the last 4 years. He was admitted with telemetry monitoring and it showed no dysrhythmias. EEG was unremarkable and so was carotid Doppler study. An echo showed mild aortic stenosis and trivial aortic and tricuspid regurgitation as well as mitral regurgitation. Ejection fraction was preserved at 55%. No wall motion abnormalities were seen. He had serial cardiac enzymes which were negative. He was started on Lopressor. Head CT from 02/25/2013 showed no acute intracranial abnormalities, brain atrophy, similar appearance of chronic hydrocephalus. I reviewed the EEG report which was normal in the awake and drowsy state. Carotid Doppler study showed mild to moderate soft plaque in the  common carotid artery and origin of the internal carotid artery on the right and minimal plaque on the left. Vertebral artery flow was antegrade. I reviewed laboratory studies from this admission from 2013/03/16. He is in the process of getting set up with cardiology. He feels at baseline. He has been using his walker more consistently since the hospital stay.   His Past Medical History Is Significant For: Past Medical History  Diagnosis Date  . Memory loss   . Benign paroxysmal positional vertigo   . Other and unspecified hyperlipidemia   . Unspecified essential hypertension   . Obstructive hydrocephalus   . Abnormality of gait   . Hyperlipidemia   . Basal cell carcinoma     "primarily on my face; I've had a bunch" (02/25/2013)  . 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 Aqueductal stenosis   . Asthma     "I've outgrown that now; I was on allergy shots for awhile" (02/25/2013)  . Arthritis     "left knee" (02/25/2013)  . 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 Suprapubic prostatectomy  2009  . Basal cell carcinoma excision Left 2000's    points to nasal fold  (02/25/2013)    His Family History Is Significant For: Family History  Problem Relation Age of Onset  . Heart failure Father   . Dementia Mother     His Social  History Is Significant For: History   Social History  . Marital Status: Married    Spouse Name: N/A    Number of Children: N/A  . Years of Education: N/A   Social History Main Topics  . Smoking status: Former Smoker -- 8 years    Types: Pipe  . Smokeless tobacco: Never Used     Comment: 02/25/2013 "quit smoking > 20 yr ago"  . Alcohol Use: No  . Drug Use: No  . Sexually Active: Yes   Other Topics Concern  . None   Social History Narrative  . None    His Allergies Are:   Allergies  Allergen Reactions  . Lexapro (Escitalopram Oxalate) Other (See Comments)    Unknown: reaction occurred some time ago and pt cannot remember what it was  . Tetracyclines & Related Other (See Comments)    Unknown: Reaction occurred some time ago and pt cannot remember what it was   :   His Current Medications Are:  Outpatient Encounter Prescriptions as of 03/03/2013  Medication Sig Dispense Refill  . acetaminophen (TYLENOL) 500 MG tablet Take 500 mg by mouth 2 (two) times daily.      . carbidopa-levodopa (SINEMET IR) 25-100 MG per tablet Take 1 tablet by mouth 3 (three) times daily.  270 tablet  1  . cyclobenzaprine (FLEXERIL) 10 MG tablet Take 5 mg by mouth at bedtime.       . donepezil (ARICEPT) 5 MG tablet Take 1 tablet (5 mg total) by mouth daily.  90 tablet  1  . mesalamine (APRISO) 0.375 G 24 hr capsule Take 375 mg by mouth every morning. Each morning      . metoprolol tartrate (LOPRESSOR) 25 MG tablet Take 1 tablet (25 mg total) by mouth 2 (two) times daily.  60 tablet  1  . valsartan (DIOVAN) 160 MG tablet Take 80 mg by mouth daily.        No facility-administered encounter medications on file as of 03/03/2013.  :  Review of Systems  Cardiovascular:       Murmur  Neurological: Positive for syncope and weakness.    Objective:  Neurologic Exam  Physical Exam Physical Examination:   Filed Vitals:   03/03/13 1530  BP: 116/69  Pulse: 68  Temp: 97.2 F (36.2 C)    General Examination: The patient is a very pleasant 72 y.o. male in no acute distress. He denies lightheadedness upon standing.   HEENT: Macrocephalic, atraumatic, pupils are equal, round and reactive to light and accommodation. Extraocular tracking shows mild saccadic breakdown without nystagmus noted. There is no limitation to gaze. There is no decrease in eye blink rate. Hearing is impaired mildly. Tympanic membranes are clear bilaterally. Face is symmetric with no facial masking and normal facial  sensation. There is no lip, neck or jaw tremor. Neck is not rigid with intact passive ROM. There are no carotid bruits on auscultation. Oropharynx exam reveals mild mouth dryness. No significant airway crowding is noted. Mallampati is class II. Tongue protrudes centrally and palate elevates symmetrically.   There is no drooling.   Chest: is clear to auscultation without wheezing, rhonchi or crackles noted.  Heart: sounds are regular and normal without murmurs, rubs or gallops noted.   Abdomen: is soft, non-tender and non-distended with normal bowel sounds appreciated on auscultation.  Extremities: There is no pitting edema in the distal lower extremities bilaterally. Pedal pulses are intact.   Skin: is warm and dry with no trophic changes noted.  Age-related changes are noted on the skin.   Musculoskeletal: exam reveals: Left knee swelling and genu varus on the left.  Neurologically:  Mental status: The patient is awake and alert, paying good  attention. He is able to completely provide the history. His wife provides additional details. He is oriented to: person, place, time/date, situation, day of week, month of year and year. His memory, attention, language and knowledge are fairly well intact. There is no aphasia, agnosia, apraxia or anomia. There is a no degree of bradyphrenia. Speech is mildly hypophonic with no dysarthria noted. Mood is congruent and affect is normal.   Cranial nerves are as described above under HEENT exam. In addition, shoulder shrug is normal with equal shoulder height noted.  Motor exam: Normal bulk, and strength for age is noted. There are no dyskinesias noted. Tone is not rigid with absence of cogwheeling in the extremities. There is overall no bradykinesia. There is no drift or rebound.  There is no tremor.   Romberg is negative, but he has to stand wide-based.  Reflexes are 1+ in the upper extremities and 1+ in the lower extremities. Fine motor skills exam:  Fine motor skills are fairly well preserved in the upper extremities and mildly impaired in the lower extremities. Cerebellar testing shows no dysmetria or intention tremor on finger to nose testing. Heel to shin is unremarkable bilaterally. There is no truncal or gait ataxia.   Sensory exam is intact to light touch, pinprick, vibration, temperature sense and proprioception in the upper and lower extremities.   Gait, station and balance: He stands up with mild difficulty and has to push himself up. He stands wide-based. He is bowlegged especially in the left. He has significant start hesitation but no classic freezing. He walks with his 2 wheeled walker and maneuvers it well. His balance is mildly impaired. He does not have a typical parkinsonian shuffle. His posture is fairly age-appropriate.  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 He had a recent syncopal spell in the context of not feeling well that day it happened; he had one episode of vomiting en route to the hospital. I cannot explain the syncope. He may have had a viral syndrome. His exam for me is stable.  I would agree with cardiological w/u and perhaps a 30 day heart monitor. He is advised to continue with the current medications, including the lopressor unless cardiology tells him otherwise. He is advised to not drive until after the cardiology w/u is completed, after which he can drive in my opinion. He is advised to use his walker at all times since he is at fall risk. He is advised to seek consultation with his orthopedic surgeon again regarding his left knee pain and swelling. He is advise to change position slowly and stay well hydrated.  I answered all their questions today and the patient and his wife were in agreement with the above outlined plan. I  would like to see the patient back in 6 months, sooner if the need arises and encouraged them to call with any interim questions, concerns, problems or updates.

## 2013-03-03 NOTE — Patient Instructions (Addendum)
I think overall you are doing fairly well but I do want to suggest a few things today:  Remember to drink plenty of fluid, eat healthy meals and do not skip any meals. Try to eat protein with a every meal and eat a healthy snack such as fruit or nuts in between meals. Try to keep a regular sleep-wake schedule and try to exercise daily, particularly in the form of walking, 20-30 minutes a day, if you can.   Do not drive until after the cardiology w/u is completed, after which you can resume driving in my opinion. Use your walker at all times, change position slowly and stay well hydrated.   As far as your medications are concerned, I would like to suggest no changes.    As far as diagnostic testing: no new test at this time.  I would like to see you back in 6 months, sooner if we need to. Please call us with any interim questions, concerns, problems, updates or refill requests.  Brett Canales is my clinical assistant and will answer any of your questions and relay your messages to me and also relay most of my messages to you.  Our phone number is [phone]. We also have an after hours call service for urgent matters and there is a physician on-call for urgent questions. For any emergencies you know to call 911 or go to the nearest emergency room.

## 2013-03-11 ENCOUNTER — Encounter: Payer: Self-pay | Admitting: Cardiovascular Disease

## 2013-03-11 ENCOUNTER — Encounter: Payer: Self-pay | Admitting: *Deleted

## 2013-03-14 ENCOUNTER — Encounter: Payer: Self-pay | Admitting: Cardiovascular Disease

## 2013-03-14 ENCOUNTER — Ambulatory Visit (INDEPENDENT_AMBULATORY_CARE_PROVIDER_SITE_OTHER): Payer: Medicare Other | Admitting: Cardiovascular Disease

## 2013-03-14 VITALS — BP 132/82 | HR 56 | Ht 66.5 in | Wt 162.0 lb

## 2013-03-14 DIAGNOSIS — I10 Essential (primary) hypertension: Secondary | ICD-10-CM

## 2013-03-14 DIAGNOSIS — R55 Syncope and collapse: Secondary | ICD-10-CM

## 2013-03-14 DIAGNOSIS — R269 Unspecified abnormalities of gait and mobility: Secondary | ICD-10-CM

## 2013-03-14 DIAGNOSIS — R413 Other amnesia: Secondary | ICD-10-CM

## 2013-03-14 DIAGNOSIS — R011 Cardiac murmur, unspecified: Secondary | ICD-10-CM

## 2013-03-14 NOTE — Assessment & Plan Note (Addendum)
No evidence of cardiac etiology No need for further w/u Likely related to neurologic issues Also sees a psychologist and is on Aricept.  Will d/c beta blocker no indication for it and will blunt compensitory resopnse to vagal episode.  Discussed no driving for 6 months

## 2013-03-14 NOTE — Assessment & Plan Note (Signed)
Well controlled.  Continue current medications and low sodium Dash type diet.    

## 2013-03-14 NOTE — Progress Notes (Signed)
Patient ID: [name], male   DOB: March 15, 1941, 72 y.o.   MRN: [id number] [name] is a very pleasant 72 year old gentleman who presents for followup consultation after a recent hospitalization. I first met him on 02/04/2013 with a history of parkinsonism and gait disorder. He is accompanied again by his wife. He used to follow with Dr. Avie [name] and was last seen by him on 10/05/2012, and which time Dr. Sandria [name] felt that his gait was improved on levodopa therapy. 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 He started having difficulty with his walking after prostate surgery in 2009. 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 He had an EEG in December 2011 showing transient slowing in the right central temporal region. He has had memory loss. 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 He had been doing well on levodopa therapy and I kept him on the same dose. I suggested a six-month followup. The patient was admitted to [facility] on 02/25/2013 and discharged on 02/27/2013 due to syncope and collapse.. The patient has had 2 similar episodes in the past within the last 4 years. He was admitted with telemetry monitoring and it showed no dysrhythmias. EEG was unremarkable and so was carotid Doppler study. An echo showed mild aortic stenosis and trivial aortic and tricuspid regurgitation as well as mitral regurgitation. Ejection fraction was preserved at 55%. No wall motion abnormalities were seen. He had serial cardiac enzymes which were negative. He was started on Lopressor. Head CT from 02/25/2013 showed no acute intracranial abnormalities, brain atrophy, similar appearance of chronic hydrocephalus. I reviewed the EEG report which was normal in the awake and drowsy state. Carotid Doppler study showed mild to moderate soft plaque in the common carotid  artery and origin of the internal carotid artery on the right and minimal plaque on the left. Vertebral artery flow was antegrade. I reviewed laboratory studies from this admission from 2013-03-28.  All of his episodes seem like simple faints.  Initial one was after prolonged sitting listening to a sermon and raising suddenly  2nd and 3rd episode with getting too hot and stanidng suddenly Episode that brought him to hospital he had not felt well all morning with queesyness  03-29-23  Normal carotid Mar 29, 2023 Echo mild AS  Study Conclusions  - Left ventricle: Systolic function was normal. The estimated ejection fraction was in the range of 55% to 60%. Left ventricular diastolic function parameters were normal. - Aortic valve: Moderately thickened with mild calcification, with slightly reduced excursion. Velocity and gradients were not calculated, but stenosis appears to be mild. Mild regurgitation. - Mitral valve: Mildly thickened leaflets . Mild regurgitation. - Tricuspid valve: Mild regurgitation. - Pulmonary arteries: PA peak pressure: 31mm Hg (S).  ROS: Denies fever, malais, weight loss, blurry vision, decreased visual acuity, cough, sputum, SOB, hemoptysis, pleuritic pain, palpitaitons, heartburn, abdominal pain, melena, lower extremity edema, claudication, or rash.  All other systems reviewed and negative   General:  Not postural 120 systolic sitting and standing  Affect appropriate Healthy:  appears stated age HEENT: normal Neck supple with no adenopathy JVP normal no bruits no thyromegaly Lungs clear with no wheezing and good diaphragmatic motion Heart:  S1/S2 mild  murmur,rub, gallop or click PMI normal Abdomen: benighn, BS positve, no tenderness, no AAA no bruit.  No HSM or HJR Distal pulses  intact with no bruits No edema Neuro non-focal Skin warm and dry No muscular weakness  Medications Current Outpatient Prescriptions  Medication Sig Dispense Refill  . acetaminophen  (TYLENOL) 500 MG tablet Take 500 mg by mouth 2 (two) times daily.      . carbidopa-levodopa (SINEMET IR) 25-100 MG per tablet Take 1 tablet by mouth 3 (three) times daily.  270 tablet  1  . cyclobenzaprine (FLEXERIL) 10 MG tablet Take 5 mg by mouth at bedtime.       . donepezil (ARICEPT) 5 MG tablet Take 1 tablet (5 mg total) by mouth daily.  90 tablet  1  . mesalamine (APRISO) 0.375 G 24 hr capsule Take 375 mg by mouth every morning. Each morning      . metoprolol tartrate (LOPRESSOR) 25 MG tablet Take 1 tablet (25 mg total) by mouth 2 (two) times daily.  60 tablet  1  . valsartan (DIOVAN) 160 MG tablet Take 80 mg by mouth daily.        No current facility-administered medications for this visit.    Allergies Lexapro and Tetracyclines & related  Family History: Family History  Problem Relation Age of Onset  . Heart failure Father   . Dementia Mother     Social History: History   Social History  . Marital Status: Married    Spouse Name: N/A    Number of Children: N/A  . Years of Education: N/A   Occupational History  . Not on file.   Social History Main Topics  . Smoking status: Former Smoker -- 8 years    Types: Pipe  . Smokeless tobacco: Never Used     Comment: 02/25/2013 "quit smoking > 20 yr ago"  . Alcohol Use: No  . Drug Use: No  . Sexually Active: Yes   Other Topics Concern  . Not on file   Social History Narrative  . No narrative on file    Electrocardiogram:  NSR rate 62 normal   Assessment and Plan

## 2013-03-14 NOTE — Assessment & Plan Note (Signed)
Mild AS not clinically significant  F/U echo in a year

## 2013-03-14 NOTE — Assessment & Plan Note (Signed)
Did not get along with Dr Alena Bills replacement at [facility] Will set up with [facility]

## 2013-03-14 NOTE — Patient Instructions (Addendum)
**Note De-Identified  Obfuscation** Your physician is recommending that you see a Neurology (Dr Tat)  Your physician has recommended you make the following change in your medication: take Metoprolol once daily for 1 week then stop taking  Your physician wants you to follow-up in: 1 year.You will receive a reminder letter in the mail two months in advance. If you don't receive a letter, please call our office to schedule the follow-up appointment.

## 2013-03-14 NOTE — Assessment & Plan Note (Signed)
On aricept Wife thinks beta blocker making memory worse will stop

## 2013-03-16 ENCOUNTER — Other Ambulatory Visit: Payer: Self-pay

## 2013-03-17 ENCOUNTER — Other Ambulatory Visit: Payer: Self-pay | Admitting: Orthopedic Surgery

## 2013-03-17 ENCOUNTER — Ambulatory Visit (INDEPENDENT_AMBULATORY_CARE_PROVIDER_SITE_OTHER): Payer: Medicare Other

## 2013-03-17 DIAGNOSIS — R52 Pain, unspecified: Secondary | ICD-10-CM

## 2013-04-04 ENCOUNTER — Encounter: Payer: Self-pay | Admitting: Neurology

## 2013-04-04 ENCOUNTER — Ambulatory Visit (INDEPENDENT_AMBULATORY_CARE_PROVIDER_SITE_OTHER): Payer: Medicare Other | Admitting: Neurology

## 2013-04-04 VITALS — BP 110/72 | HR 88 | Temp 97.9°F | Wt 160.0 lb

## 2013-04-04 DIAGNOSIS — G911 Obstructive hydrocephalus: Secondary | ICD-10-CM

## 2013-04-04 DIAGNOSIS — R9402 Abnormal brain scan: Secondary | ICD-10-CM

## 2013-04-04 DIAGNOSIS — G609 Hereditary and idiopathic neuropathy, unspecified: Secondary | ICD-10-CM

## 2013-04-04 DIAGNOSIS — Z113 Encounter for screening for infections with a predominantly sexual mode of transmission: Secondary | ICD-10-CM

## 2013-04-04 DIAGNOSIS — R9409 Abnormal results of other function studies of central nervous system: Secondary | ICD-10-CM

## 2013-04-04 DIAGNOSIS — G20 Parkinson's disease: Secondary | ICD-10-CM

## 2013-04-04 LAB — FOLATE: Folate: 18.9 ng/mL (ref >5.9)

## 2013-04-04 LAB — VITAMIN B12: Vitamin B-12: 287 pg/mL (ref 211–911)

## 2013-04-04 MED ORDER — CARBIDOPA-LEVODOPA 25-100 MG PO TABS: 1.0000 | ORAL_TABLET | Freq: Three times a day (TID) | ORAL | Status: DC

## 2013-04-04 NOTE — Progress Notes (Signed)
[name]. was seen today in the movement disorders clinic for neurologic consultation at the request of Josue Hector, MD.  The consultation is for the evaluation of parkinsonism.  He was a previous pt of Dr. Sandria Manly and Dr. Frances Furbish.  I reviewed the records made available to me.  Dr. Alena Bills dx is parkinsonism and gait changes.   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  He was followed by Dr. 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 After his prostate surgery in 2009 he developed marked difficulty with his walking and started using a walker. He had shuffling gait.   He went to Dr. Sandria Manly and was dx with parkinsonism.  He was placed on levodopa and it helped tremendously.  He takes it at 7am/3pm/11pm.  He gets tired before his next pill.  He does not note wearing off.  He does think that therapy helped.  The patient was admitted to [facility] on 02/25/2013 and discharged on 02/27/2013 due to syncope and collapse. The patient has had 2 similar episodes in the past within the last 4 years.  On one occasion, he was at church and standing in the choir for a prolonged period of time and ultimately had a syncopal episode with loss of bladder control.  There was no shaking.  On another incident, he was in church and quickly stood up and fainted.  With the most recent episode in July, he did not feel that all morning and was sitting at the kitchen table when he fainted.  There was no loss of bladder or bowel control.  There was no witnessed seizure activity.  He was admitted with telemetry monitoring and it showed no dysrhythmias. EEG was unremarkable and so was carotid Doppler study. An  echo showed mild aortic stenosis and trivial aortic and tricuspid regurgitation as well as mitral regurgitation. Ejection fraction was preserved at 55%. No wall motion abnormalities were seen. He had serial cardiac enzymes which were negative. He was started on Lopressor. Head CT from 02/25/2013 showed no acute intracranial abnormalities, brain atrophy, similar appearance of chronic hydrocephalus. Carotid Doppler study showed mild to moderate soft plaque in the common carotid artery and origin of the internal carotid artery on the right and minimal plaque on the left. Vertebral artery flow was antegrade.     Specific Symptoms:  Tremor: no Voice: getting weaker.  Was a Education officer, environmental and more difficulty Sleep:   Vivid Dreams:  no  Acting out dreams:  no Wet Pillows: yes Postural symptoms:  yes (uses walker in public)  Falls?  no (none for years, since the addition of levodopa) Bradykinesia symptoms: shuffling gait Loss of smell:  no Loss of taste:  no Urinary Incontinence:  yes (rarely) Difficulty Swallowing:  no Handwriting, micrographia: no Trouble with ADL's:  no  Trouble buttoning clothing: no Depression:  no Memory changes:  yes (some change c/t 5-6 years ago) Hallucinations:  no  visual distortions: no N/V:  no Lightheaded:  yes  Syncope: yes Diplopia:  no Dyskinesia:  no  Neuroimaging has  previously been performed.  It is available for my review today.  The most recent MRI of the brain was from October, 2012.  There is severe ventriculomegaly do to  an arachnoid cyst.    ALLERGIES:   Allergies  Allergen Reactions  . Lexapro [Escitalopram Oxalate] Other (See Comments)    Unknown: reaction occurred some time ago and pt cannot remember what it was  . Tetracyclines & Related Other (See Comments)    Unknown: Reaction occurred some time ago and pt cannot remember what it was     CURRENT MEDICATIONS:  Current Outpatient Prescriptions on File Prior to Visit  Medication Sig  Dispense Refill  . acetaminophen (TYLENOL) 500 MG tablet Take 500 mg by mouth 2 (two) times daily.      . carbidopa-levodopa (SINEMET IR) 25-100 MG per tablet Take 1 tablet by mouth 3 (three) times daily.  270 tablet  1  . cyclobenzaprine (FLEXERIL) 10 MG tablet Take 5 mg by mouth at bedtime.       . donepezil (ARICEPT) 5 MG tablet Take 1 tablet (5 mg total) by mouth daily.  90 tablet  1  . mesalamine (APRISO) 0.375 G 24 hr capsule Take 375 mg by mouth every morning. Each morning      . valsartan (DIOVAN) 160 MG tablet Take 80 mg by mouth daily.        No current facility-administered medications on file prior to visit.    PAST MEDICAL HISTORY:   Past Medical History  Diagnosis Date  . Memory loss   . Benign paroxysmal positional vertigo   . Other and unspecified hyperlipidemia   . Unspecified essential hypertension   . Obstructive hydrocephalus   . Abnormality of gait   . Hyperlipidemia   . Basal cell carcinoma     "primarily on my face; I've had a bunch" (02/25/2013)  . 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 Aqueductal stenosis   . Asthma     "I've outgrown that now; I was on allergy shots for awhile" (02/25/2013)  . Arthritis     "left knee" (02/25/2013)  . 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 Suprapubic prostatectomy  2009  . Basal cell carcinoma excision Left 2000's    points to nasal fold  (02/25/2013)    SOCIAL HISTORY:   History   Social History  . Marital Status: Married    Spouse Name: N/A    Number of Children: N/A  . Years of Education: N/A   Occupational History  . Not on file.   Social History Main Topics  . Smoking status: Former Smoker -- 8 years    Types: Pipe    Quit date: 04/04/1998  . Smokeless tobacco: Never Used     Comment: 02/25/2013 "quit smoking > 20 yr ago"  . Alcohol Use: No  . Drug  Use: No  . Sexual Activity: Yes   Other Topics Concern  . Not on file   Social History Narrative  . No narrative on file    FAMILY HISTORY:   Family Status  Relation Status Death Age  . Mother Deceased 79  . Father Deceased 13    ROS:  A complete 10 system review of systems was obtained and was unremarkable apart from what is mentioned above.  PHYSICAL EXAMINATION:    VITALS:   Filed Vitals:   04/04/13 1348 04/04/13 1352 04/04/13 1353  BP: 120/72 116/70 110/72  Pulse: 72 76 88  Temp: 97.9 F (36.6 C)  Weight: 160 lb (72.576 kg)      GEN:  The patient appears stated age and is in NAD. HEENT:  Normocephalic, atraumatic.  The mucous membranes are moist. The superficial temporal arteries are without ropiness or tenderness. CV:  RRR Lungs:  CTAB Neck/HEME:  There are no carotid bruits bilaterally.  Neurological examination:  Orientation: The patient is alert and oriented to person and place.  He knows the day of the week, month and year.  He stated that it was the 26th and it is actually the 25th.  He had difficulty with recall.  He scored an 18/30 on his MoCA.  He did well with the making portion.  He was able to name 11 animals in 1 minute time span. Cranial nerves: There is good facial symmetry. Pupils are equal round and reactive to light bilaterally. Fundoscopic exam reveals clear margins bilaterally. Extraocular muscles are intact. The visual fields are full to confrontational testing. The speech is fluent and clear. Soft palate rises symmetrically and there is no tongue deviation. Hearing is intact to conversational tone. Sensation: Sensation is intact to light and pinprick throughout (facial, trunk, extremities). Vibration is decreased at the bilateral big and ankle toe. There is no extinction with double simultaneous stimulation. There is no sensory dermatomal level identified. Motor: Strength is 5/5 in the bilateral upper and lower extremities.   Shoulder shrug is  equal and symmetric.  There is a right pronator drift Deep tendon reflexes: Deep tendon reflexes are 2/4 at the bilateral biceps, triceps, brachioradialis, 2+ at the left patella, 1/4 at the right patella and absent at the bilateral achilles. Plantar responses are downgoing bilaterally.  Movement examination: Tone: There is normal tone in the bilateral upper extremities.  The tone in the lower extremities is normal.  Abnormal movements: None Coordination:  There is no decremation with RAM's, Including alternating supination and pronation of the forearm, hand opening and closing, finger taps, heel taps and toe taps. Gait and Station: The patient has  difficulty arising out of a deep-seated chair without the use of the hands. The patient's stride length is markedly decreased with shuffling and freezing.  He turns en bloc.  He has start hesitation.     LABS:  No results found for this basename: VITAMINB12   No results found for this basename: FOLATE     Chemistry      Component Value Date/Time   NA 136 02/26/2013 0208   K 3.8 02/26/2013 0208   CL 103 02/26/2013 0208   CO2 27 02/26/2013 0208   BUN 12 02/26/2013 0208   CREATININE 0.54 02/26/2013 0208      Component Value Date/Time   CALCIUM 8.9 02/26/2013 0208     Lab Results  Component Value Date   WBC 6.6 02/26/2013   HGB 13.7 02/26/2013   HCT 39.9 02/26/2013   MCV 92.4 02/26/2013   PLT 162 02/26/2013   Lab Results  Component Value Date   TSH 0.507 02/25/2013     ASSESSMENT/PLAN:  1.  Parkinsonism.  -I do not think that the patient has idiopathic Parkinson's disease.  He is not bradykinetic or rigid and has no tremor.  He has significant shuffling gait, however.  This looks like it could be vascular, but MRI doesn't support this.  He has significant ventriculomegaly from an arachnoid cyst, but this apparently has been stable all the way back into the late 90's.  He is responding to levodopa, which can happen in vascular parkinsonism.  I  would leave him on this medication, but move the dosages closer together so that he is taking it 3 times a day before meals.  Risks, benefits, side effects and alternative therapies were discussed.  The opportunity to ask questions was given and they were answered to the best of my ability.  The patient expressed understanding and willingness to follow the outlined treatment protocols.  -I do think that he would benefit from another course of physical therapy.  I will ask for speech therapy as well as he is noticing voice changes. 2.  PN, on examination  -This could contribute to gait instability, and we will order B12, folate, RPR, serum and urinary protein electrophoresis with immunofixation.  -Safety was discussed. 3.  If he deteriorates, then I do think that another MRI is in order.  He has a R pronator drift but it could be from this hydroceph with mass effect that is chronic. 4.  Syncope  -He has had syncopal episodes of unknown etiology that have been worked up extensively.  He was not particularly orthostatic in the office today.  We will monitor this. 5.  he will let me know questions or concerns before next visit.

## 2013-04-04 NOTE — Patient Instructions (Addendum)
Referral sent to [facility].  They will call directly to schedule your appointments.  [address] ([address] Imagene Gurney office)  [phone].  We will call with the lab results.

## 2013-04-05 ENCOUNTER — Telehealth: Payer: Self-pay | Admitting: Neurology

## 2013-04-05 LAB — RPR

## 2013-04-05 NOTE — Telephone Encounter (Signed)
Spoke with pt's wife at length, but without a signed release I could not tell her really anything.  She will have her husband come by and sign release.

## 2013-04-06 LAB — PROTEIN ELECTROPHORESIS, SERUM
Albumin ELP: 61.1 % (ref 55.8–66.1)
Alpha-1-Globulin: 3.6 % (ref 2.9–4.9)
Alpha-2-Globulin: 9.9 % (ref 7.1–11.8)
Beta 2: 5.8 % (ref 3.2–6.5)
Beta Globulin: 5.9 % (ref 4.7–7.2)
Gamma Globulin: 13.7 % (ref 11.1–18.8)
Total Protein, Serum Electrophoresis: 7.1 g/dL (ref 6.0–8.3)

## 2013-04-06 LAB — IMMUNOFIXATION ELECTROPHORESIS
IgA: 341 mg/dL (ref 68–379)
IgA: 360 mg/dL (ref 68–379)
IgG (Immunoglobin G), Serum: 1050 mg/dL (ref 650–1600)
IgG (Immunoglobin G), Serum: 1100 mg/dL (ref 650–1600)
IgM, Serum: 86 mg/dL (ref 41–251)
IgM, Serum: 87 mg/dL (ref 41–251)
Total Protein, Serum Electrophoresis: 7 g/dL (ref 6.0–8.3)
Total Protein, Serum Electrophoresis: 7.1 g/dL (ref 6.0–8.3)

## 2013-04-06 LAB — UIFE/LIGHT CHAINS/TP QN, 24-HR UR
Albumin, U: DETECTED
Free Kappa Lt Chains,Ur: 2.59 mg/dL — ABNORMAL HIGH (ref 0.14–2.42)
Free Kappa/Lambda Ratio: 16.19 ratio — ABNORMAL HIGH (ref 2.04–10.37)
Free Lambda Lt Chains,Ur: 0.16 mg/dL (ref 0.02–0.67)
Total Protein, Urine: 4.2 mg/dL

## 2013-04-13 ENCOUNTER — Ambulatory Visit: Payer: Medicare Other

## 2013-04-13 ENCOUNTER — Ambulatory Visit: Payer: Medicare Other | Attending: Neurology | Admitting: Physical Therapy

## 2013-04-13 DIAGNOSIS — R269 Unspecified abnormalities of gait and mobility: Secondary | ICD-10-CM | POA: Insufficient documentation

## 2013-04-13 DIAGNOSIS — Z5189 Encounter for other specified aftercare: Secondary | ICD-10-CM | POA: Insufficient documentation

## 2013-04-13 DIAGNOSIS — Z9181 History of falling: Secondary | ICD-10-CM | POA: Insufficient documentation

## 2013-04-13 DIAGNOSIS — R471 Dysarthria and anarthria: Secondary | ICD-10-CM | POA: Insufficient documentation

## 2013-04-13 DIAGNOSIS — G20 Parkinson's disease: Secondary | ICD-10-CM | POA: Insufficient documentation

## 2013-04-13 DIAGNOSIS — G20A1 Parkinson's disease without dyskinesia, without mention of fluctuations: Secondary | ICD-10-CM | POA: Insufficient documentation

## 2013-04-18 ENCOUNTER — Ambulatory Visit: Payer: Medicare Other | Admitting: Physical Therapy

## 2013-04-19 ENCOUNTER — Ambulatory Visit: Payer: Medicare Other | Admitting: Physical Therapy

## 2013-04-25 ENCOUNTER — Ambulatory Visit: Payer: Medicare Other | Admitting: Physical Therapy

## 2013-05-03 ENCOUNTER — Ambulatory Visit: Payer: Medicare Other | Admitting: Physical Therapy

## 2013-05-03 ENCOUNTER — Ambulatory Visit: Payer: Medicare Other | Admitting: Speech Pathology

## 2013-05-04 ENCOUNTER — Ambulatory Visit: Payer: Medicare Other

## 2013-05-04 ENCOUNTER — Ambulatory Visit: Payer: Medicare Other | Admitting: Physical Therapy

## 2013-05-09 ENCOUNTER — Encounter: Payer: Medicare Other | Admitting: Speech Pathology

## 2013-05-09 ENCOUNTER — Ambulatory Visit: Payer: Medicare Other | Admitting: Physical Therapy

## 2013-05-10 ENCOUNTER — Ambulatory Visit: Payer: Medicare Other | Admitting: Cardiovascular Disease

## 2013-05-11 ENCOUNTER — Ambulatory Visit: Payer: Medicare Other | Attending: Neurology | Admitting: Physical Therapy

## 2013-05-11 ENCOUNTER — Ambulatory Visit: Payer: Medicare Other

## 2013-05-11 DIAGNOSIS — G20 Parkinson's disease: Secondary | ICD-10-CM | POA: Insufficient documentation

## 2013-05-11 DIAGNOSIS — G20A1 Parkinson's disease without dyskinesia, without mention of fluctuations: Secondary | ICD-10-CM | POA: Insufficient documentation

## 2013-05-11 DIAGNOSIS — R269 Unspecified abnormalities of gait and mobility: Secondary | ICD-10-CM | POA: Insufficient documentation

## 2013-05-11 DIAGNOSIS — Z5189 Encounter for other specified aftercare: Secondary | ICD-10-CM | POA: Insufficient documentation

## 2013-05-11 DIAGNOSIS — R471 Dysarthria and anarthria: Secondary | ICD-10-CM | POA: Insufficient documentation

## 2013-05-11 DIAGNOSIS — Z9181 History of falling: Secondary | ICD-10-CM | POA: Insufficient documentation

## 2013-05-17 ENCOUNTER — Ambulatory Visit: Payer: Medicare Other | Admitting: Physical Therapy

## 2013-05-17 ENCOUNTER — Ambulatory Visit: Payer: Medicare Other

## 2013-05-19 ENCOUNTER — Ambulatory Visit: Payer: Medicare Other

## 2013-05-19 ENCOUNTER — Ambulatory Visit: Payer: Medicare Other | Admitting: Physical Therapy

## 2013-05-23 ENCOUNTER — Ambulatory Visit: Payer: Medicare Other | Admitting: Physical Therapy

## 2013-05-25 ENCOUNTER — Ambulatory Visit: Payer: Medicare Other | Admitting: Physical Therapy

## 2013-05-30 ENCOUNTER — Ambulatory Visit: Payer: Medicare Other | Admitting: Physical Therapy

## 2013-06-01 ENCOUNTER — Ambulatory Visit: Payer: Medicare Other | Admitting: Physical Therapy

## 2013-06-07 ENCOUNTER — Ambulatory Visit: Payer: Medicare Other | Admitting: Physical Therapy

## 2013-06-08 ENCOUNTER — Ambulatory Visit: Payer: Medicare Other | Admitting: Physical Therapy

## 2013-06-09 ENCOUNTER — Ambulatory Visit: Payer: Medicare Other | Admitting: Physical Therapy

## 2013-06-16 ENCOUNTER — Other Ambulatory Visit: Payer: Self-pay

## 2013-08-01 ENCOUNTER — Ambulatory Visit: Payer: Medicare Other | Admitting: Neurology

## 2013-08-12 ENCOUNTER — Inpatient Hospital Stay (HOSPITAL_COMMUNITY)
Admission: EM | Admit: 2013-08-12 | Discharge: 2013-08-15 | DRG: 184 | Disposition: A | Discharge status: Home / Self Care | Payer: Medicare Other | Attending: Family Medicine | Admitting: Family Medicine

## 2013-08-12 ENCOUNTER — Encounter (HOSPITAL_COMMUNITY): Payer: Self-pay | Admitting: Emergency Medicine

## 2013-08-12 ENCOUNTER — Emergency Department (HOSPITAL_COMMUNITY): Payer: Medicare Other

## 2013-08-12 DIAGNOSIS — H8110 Benign paroxysmal vertigo, unspecified ear: Secondary | ICD-10-CM

## 2013-08-12 DIAGNOSIS — Z87891 Personal history of nicotine dependence: Secondary | ICD-10-CM

## 2013-08-12 DIAGNOSIS — R404 Transient alteration of awareness: Secondary | ICD-10-CM | POA: Diagnosis present

## 2013-08-12 DIAGNOSIS — R55 Syncope and collapse: Secondary | ICD-10-CM | POA: Diagnosis present

## 2013-08-12 DIAGNOSIS — G20 Parkinson's disease: Secondary | ICD-10-CM | POA: Diagnosis present

## 2013-08-12 DIAGNOSIS — R269 Unspecified abnormalities of gait and mobility: Secondary | ICD-10-CM

## 2013-08-12 DIAGNOSIS — IMO0002 Reserved for concepts with insufficient information to code with codable children: Secondary | ICD-10-CM | POA: Diagnosis present

## 2013-08-12 DIAGNOSIS — G20A1 Parkinson's disease without dyskinesia, without mention of fluctuations: Secondary | ICD-10-CM | POA: Diagnosis present

## 2013-08-12 DIAGNOSIS — R011 Cardiac murmur, unspecified: Secondary | ICD-10-CM

## 2013-08-12 DIAGNOSIS — S2239XA Fracture of one rib, unspecified side, initial encounter for closed fracture: Secondary | ICD-10-CM

## 2013-08-12 DIAGNOSIS — R413 Other amnesia: Secondary | ICD-10-CM

## 2013-08-12 DIAGNOSIS — E785 Hyperlipidemia, unspecified: Secondary | ICD-10-CM | POA: Diagnosis present

## 2013-08-12 DIAGNOSIS — G911 Obstructive hydrocephalus: Secondary | ICD-10-CM | POA: Diagnosis present

## 2013-08-12 DIAGNOSIS — W19XXXA Unspecified fall, initial encounter: Secondary | ICD-10-CM | POA: Diagnosis present

## 2013-08-12 DIAGNOSIS — Y92009 Unspecified place in unspecified non-institutional (private) residence as the place of occurrence of the external cause: Secondary | ICD-10-CM

## 2013-08-12 DIAGNOSIS — Z79899 Other long term (current) drug therapy: Secondary | ICD-10-CM

## 2013-08-12 DIAGNOSIS — S2249XA Multiple fractures of ribs, unspecified side, initial encounter for closed fracture: Principal | ICD-10-CM | POA: Diagnosis present

## 2013-08-12 DIAGNOSIS — I10 Essential (primary) hypertension: Secondary | ICD-10-CM | POA: Diagnosis present

## 2013-08-12 DIAGNOSIS — J9819 Other pulmonary collapse: Secondary | ICD-10-CM | POA: Diagnosis not present

## 2013-08-12 DIAGNOSIS — Z888 Allergy status to other drugs, medicaments and biological substances status: Secondary | ICD-10-CM

## 2013-08-12 LAB — CBC
HCT: 47.2 % (ref 39.0–52.0)
Hemoglobin: 16.4 g/dL (ref 13.0–17.0)
MCH: 32.4 pg (ref 26.0–34.0)
MCHC: 34.7 g/dL (ref 30.0–36.0)
MCV: 93.3 fL (ref 78.0–100.0)
Platelets: 209 10*3/uL (ref 150–400)
RBC: 5.06 MIL/uL (ref 4.22–5.81)
RDW: 13 % (ref 11.5–15.5)
WBC: 9.1 10*3/uL (ref 4.0–10.5)

## 2013-08-12 LAB — BASIC METABOLIC PANEL
BUN: 13 mg/dL (ref 6–23)
CO2: 27 mEq/L (ref 19–32)
Calcium: 9.9 mg/dL (ref 8.4–10.5)
Chloride: 99 mEq/L (ref 96–112)
Creatinine, Ser: 0.65 mg/dL (ref 0.50–1.35)
GFR calc Af Amer: >90 mL/min (ref >90)
GFR calc non Af Amer: >90 mL/min (ref >90)
Glucose, Bld: 109 mg/dL — ABNORMAL HIGH (ref 70–99)
Potassium: 4.1 mEq/L (ref 3.7–5.3)
Sodium: 139 mEq/L (ref 137–147)

## 2013-08-12 LAB — PRO B NATRIURETIC PEPTIDE: Pro B Natriuretic peptide (BNP): 83.7 pg/mL (ref 0–125)

## 2013-08-12 LAB — POCT I-STAT TROPONIN I: Troponin i, poc: 0.01 ng/mL (ref 0.00–0.08)

## 2013-08-12 MED ORDER — ONDANSETRON HCL 4 MG/2ML IJ SOLN: 4.0000 mg | Freq: Four times a day (QID) | INTRAMUSCULAR | Status: DC | PRN

## 2013-08-12 MED ORDER — DONEPEZIL HCL 5 MG PO TABS
5.0000 mg | ORAL_TABLET | Freq: Every day | ORAL | Status: DC
  Administered 2013-08-13 – 2013-08-15 (×3): 5 mg via ORAL
  Filled 2013-08-12 (×3): qty 1

## 2013-08-12 MED ORDER — ACETAMINOPHEN 650 MG RE SUPP: 650.0000 mg | Freq: Four times a day (QID) | RECTAL | Status: DC | PRN

## 2013-08-12 MED ORDER — HYDROMORPHONE HCL PF 1 MG/ML IJ SOLN: 1.0000 mg | INTRAMUSCULAR | Status: DC | PRN

## 2013-08-12 MED ORDER — CYCLOBENZAPRINE HCL 5 MG PO TABS
5.0000 mg | ORAL_TABLET | Freq: Every day | ORAL | Status: DC
  Administered 2013-08-13 – 2013-08-14 (×2): 5 mg via ORAL
  Filled 2013-08-12 (×4): qty 1

## 2013-08-12 MED ORDER — MORPHINE SULFATE 4 MG/ML IJ SOLN
4.0000 mg | Freq: Once | INTRAMUSCULAR | Status: AC
  Administered 2013-08-12: 4 mg via INTRAVENOUS
  Filled 2013-08-12: qty 1

## 2013-08-12 MED ORDER — ZOLPIDEM TARTRATE 5 MG PO TABS: 5.0000 mg | ORAL_TABLET | Freq: Every evening | ORAL | Status: DC | PRN

## 2013-08-12 MED ORDER — OXYCODONE HCL 5 MG PO TABS
5.0000 mg | ORAL_TABLET | ORAL | Status: DC | PRN
  Administered 2013-08-14 – 2013-08-15 (×2): 5 mg via ORAL
  Filled 2013-08-12 (×2): qty 1

## 2013-08-12 MED ORDER — MESALAMINE ER 0.375 G PO CP24: 0.3750 g | ORAL_CAPSULE | Freq: Every morning | ORAL | Status: DC

## 2013-08-12 MED ORDER — SODIUM CHLORIDE 0.9 % IV BOLUS (SEPSIS)
500.0000 mL | Freq: Once | INTRAVENOUS | Status: AC
  Administered 2013-08-12: 500 mL via INTRAVENOUS

## 2013-08-12 MED ORDER — SODIUM CHLORIDE 0.9 % IJ SOLN
3.0000 mL | Freq: Two times a day (BID) | INTRAMUSCULAR | Status: DC
  Administered 2013-08-13 – 2013-08-14 (×5): 3 mL via INTRAVENOUS

## 2013-08-12 MED ORDER — ASPIRIN EC 325 MG PO TBEC
325.0000 mg | DELAYED_RELEASE_TABLET | Freq: Every day | ORAL | Status: DC
  Administered 2013-08-13 – 2013-08-15 (×3): 325 mg via ORAL
  Filled 2013-08-12 (×3): qty 1

## 2013-08-12 MED ORDER — IRBESARTAN 75 MG PO TABS
75.0000 mg | ORAL_TABLET | Freq: Every day | ORAL | Status: DC
  Administered 2013-08-13 – 2013-08-15 (×3): 75 mg via ORAL
  Filled 2013-08-12 (×3): qty 1

## 2013-08-12 MED ORDER — IOHEXOL 350 MG/ML SOLN
100.0000 mL | Freq: Once | INTRAVENOUS | Status: AC | PRN
  Administered 2013-08-12: 100 mL via INTRAVENOUS

## 2013-08-12 MED ORDER — ONDANSETRON HCL 4 MG PO TABS: 4.0000 mg | ORAL_TABLET | Freq: Four times a day (QID) | ORAL | Status: DC | PRN

## 2013-08-12 MED ORDER — ACETAMINOPHEN 325 MG PO TABS
650.0000 mg | ORAL_TABLET | Freq: Four times a day (QID) | ORAL | Status: DC | PRN
  Administered 2013-08-13: 650 mg via ORAL
  Filled 2013-08-12: qty 2

## 2013-08-12 MED ORDER — ENOXAPARIN SODIUM 40 MG/0.4ML ~~LOC~~ SOLN
40.0000 mg | SUBCUTANEOUS | Status: DC
  Administered 2013-08-13 – 2013-08-14 (×2): 40 mg via SUBCUTANEOUS
  Filled 2013-08-12 (×3): qty 0.4

## 2013-08-12 MED ORDER — CARBIDOPA-LEVODOPA 25-100 MG PO TABS
1.0000 | ORAL_TABLET | Freq: Three times a day (TID) | ORAL | Status: DC
  Administered 2013-08-13 – 2013-08-15 (×7): 1 via ORAL
  Filled 2013-08-12 (×10): qty 1

## 2013-08-12 NOTE — ED Notes (Addendum)
Pt back from CT

## 2013-08-12 NOTE — ED Notes (Signed)
Pt taken to ct 

## 2013-08-12 NOTE — H&P (Signed)
[facility]                                                                                    [name], is a 73 y.o. male  CSN: [id number]  MRN: [id number]  DOB - 1941/03/26  Admit Date - 08/12/2013  Outpatient Primary MD for the patient is Sherrie Mustache, MD   With History of -  Past Medical History  Diagnosis Date  . Memory loss   . Benign paroxysmal positional vertigo   . Other and unspecified hyperlipidemia   . Unspecified essential hypertension   . Obstructive hydrocephalus   . Abnormality of gait   . Basal cell carcinoma     "primarily on my face; I've had a bunch" (02/25/2013)  . 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 Asthma     "I've outgrown that now; I was on allergy shots for awhile" (02/25/2013)  . Arthritis     "left knee" (02/25/2013)  . 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 Ulcerative colitis       Past Surgical History  Procedure Laterality Date  . Suprapubic prostatectomy  2009  . Basal cell carcinoma excision Left 2000's    points to nasal fold  (02/25/2013)    in for   Chief Complaint  Patient presents with  . Loss of Consciousness     HPI  [name]  is a 73 y.o. male, with past medical history significant for Parkinson disease presenting to today after a fall from a sitting position and syncope. The patient stayed out for around one hour and his wife found him on the floor and was transferred here. No preceding chest pains headache or dizziness. Patient was admitted on July of last year for another syncopal episode with negative echo and carotid Dopplers. Patient complains of chest tenderness after the fall and was found to have right-sided rib fractures and a right facial abrasions and was seen by trauma surgery who will follow him during his stay. The    Review of Systems    In addition to the HPI above,  No  Fever-chills, No Headache, No changes with Vision or hearing, No problems swallowing food or Liquids, Right-sided chest pain, no Cough or Shortness of Breath, No Abdominal pain, No Nausea or Vommitting, Bowel movements are regular, No Blood in stool or Urine, No dysuria, No new skin rashes or bruises, No new joints pains-aches,  No new weakness, tingling, numbness in any extremity, No recent weight gain or loss, No polyuria, polydypsia or polyphagia, No significant Mental Stressors.  A full 10 point Review of Systems was done, except as stated above, all other Review of Systems were negative.   Social History History  Substance Use Topics  . Smoking status: Former Smoker -- 8 years    Types: Pipe    Quit date: 04/04/1998  . Smokeless tobacco: Never Used     Comment: 02/25/2013 "quit smoking > 20 yr ago"  . Alcohol Use: No     Family History Family History  Problem Relation  Age of Onset  . Heart failure Father   . Dementia Mother      Prior to Admission medications   Medication Sig Start Date End Date Taking? Authorizing Provider  acetaminophen (TYLENOL) 500 MG tablet Take 500 mg by mouth 2 (two) times daily.   Yes Historical Provider, MD  carbidopa-levodopa (SINEMET IR) 25-100 MG per tablet Take 1 tablet by mouth 3 (three) times daily. 04/04/13  Yes Rebecca S Tat, DO  cyclobenzaprine (FLEXERIL) 10 MG tablet Take 5 mg by mouth at bedtime.    Yes Historical Provider, MD  donepezil (ARICEPT) 5 MG tablet Take 1 tablet (5 mg total) by mouth daily. 01/04/13  Yes Star Age, MD  mesalamine (APRISO) 0.375 G 24 hr capsule Take 375 mg by mouth every morning. Each morning   Yes Historical Provider, MD  valsartan (DIOVAN) 160 MG tablet Take 80 mg by mouth daily.    Yes Historical Provider, MD    Allergies  Allergen Reactions  . Lexapro [Escitalopram Oxalate] Other (See Comments)    Unknown: reaction occurred some time ago and pt cannot remember what it was  . Tetracyclines &  Related Other (See Comments)    Unknown: Reaction occurred some time ago and pt cannot remember what it was     Physical Exam  Vitals  Blood pressure 160/88, pulse 72, temperature 97.5 F (36.4 C), temperature source Oral, resp. rate 16, height 5\' 6"  (1.676 m), weight 71.668 kg (158 lb), SpO2 100.00%.   1. General elderly white gentleman, very pleasant, chronically ill  2. Normal affect and insight, Not Suicidal or Homicidal, Awake Alert, Oriented X 3.  3. No F.N deficits, ALL C.Nerves Intact, Strength 5/5 all 4 extremities, Sensation intact all 4 extremities, Plantars down going.  4. Ears and Eyes appear Normal, Conjunctivae clear, PERRLA. Moist Oral Mucosa.  5. Supple Neck, No JVD, No cervical lymphadenopathy appriciated, No Carotid Bruits.  6. Symmetrical Chest wall movement, Good air movement bilaterally, CTAB.  7. generalized chest tenderness, RRR, No Gallops, Rubs or Murmurs, No Parasternal Heave.  8. Positive Bowel Sounds, Abdomen Soft, Non tender, No organomegaly appriciated,No rebound -guarding or rigidity.  9.  No Cyanosis, Normal Skin Turgor, No Skin Rash or Bruise.  10. fair muscle tone,  joints appear normal , no effusions, Normal ROM, decreased muscle mass.  11. No Palpable Lymph Nodes in Neck or Axillae    Data Review  CBC  Recent Labs Lab 08/12/13 1810  WBC 9.1  HGB 16.4  HCT 47.2  PLT 209  MCV 93.3  MCH 32.4  MCHC 34.7  RDW 13.0   ------------------------------------------------------------------------------------------------------------------  Chemistries   Recent Labs Lab 08/12/13 1810  NA 139  K 4.1  CL 99  CO2 27  GLUCOSE 109*  BUN 13  CREATININE 0.65  CALCIUM 9.9   ------------------------------------------------------------------------------------------------------------------ estimated creatinine clearance is 76.4 ml/min (by C-G formula based on Cr of  0.65). ------------------------------------------------------------------------------------------------------------------    ----------------------------------------------------------------------------------------------------------------   Imaging results:   Dg Chest 2 View  08/12/2013   CLINICAL DATA:  Loss of consciousness.  Chest pain.  EXAM: CHEST  2 VIEW  COMPARISON:  02/22/2008  FINDINGS: Heart size is normal. The aorta is unremarkable. The lungs are clear. The vascularity is normal. No effusions. Chronic degenerative changes affect the spine.  IMPRESSION: No active cardiopulmonary disease. No cause of chest pain identified.   Electronically Signed   By: Nelson Chimes M.D.   On: 08/12/2013 17:09   Ct Head Wo Contrast  08/12/2013  CLINICAL DATA:  Loss of consciousness.  EXAM: CT HEAD WITHOUT CONTRAST  TECHNIQUE: Contiguous axial images were obtained from the base of the skull through the vertex without intravenous contrast.  COMPARISON:  02/25/2013.  FINDINGS: Stable noncommunicating hydrocephalus with marked dilatation of the lateral and 3rd ventricles. 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 No extra-axial fluid collections are identified. No acute intracranial findings or mass lesions. The brainstem and cerebellum are grossly normal and stable.  The bony structures are intact. The paranasal sinuses and mastoid air cells are clear.  IMPRESSION: Chronic noncommunicating hydrocephalus.  No acute intracranial findings.   Electronically Signed   By: Kalman Jewels M.D.   On: 08/12/2013 20:10   Ct Angio Chest Pe W/cm &/or Wo Cm  08/12/2013   CLINICAL DATA:  Chest trauma.  Fall.  EXAM: CT ANGIOGRAPHY CHEST WITH CONTRAST  TECHNIQUE: Multidetector CT imaging of the chest was performed using the standard protocol during bolus administration of intravenous contrast. Multiplanar CT image reconstructions including MIPs were obtained to evaluate the vascular anatomy.   CONTRAST:  162mL OMNIPAQUE IOHEXOL 350 MG/ML SOLN  COMPARISON:  None.  FINDINGS: Clinical data specifically requested concern for pulmonary embolus rather than dissection, and accordingly today's exam was performed with pulmonary arterial contrast out demise a shin rather than systemic arterial assessment. Today's exam does not rule out aortic dissection.  No filling defect is identified in the pulmonary arterial tree to suggest pulmonary embolus. No pericardial effusion or significant pleural effusion. Large irregular cystic lesions at the tops of the kidneys noted, possibly from polycystic kidney disease although malignancy is not excluded, and the 9.8 cm left kidney upper pole cyst has somewhat irregular margins and possible internal septations.  Hypodense lesion in the lateral segment left hepatic lobe, image 73 of series 4, 1.5 x 1.2 cm.  Coronary artery atherosclerotic calcification is suspected.  Thoracic spondylosis noted.  No definite sternal fracture.  Nondisplaced fractures of the right 3rd, 4th, 5th, and 6th ribs. There is dependent subsegmental atelectasis in both lower lobes.  Review of the MIP images confirms the above findings.  IMPRESSION: 1. No embolus identified. Today's exam is not sensitive for aortic dissection due to Pulmonary rather than systemic arterial enhancement. 2. Nondisplaced fractures of the right 3rd, 4th, 5th, and 6th ribs are seen. 3. Coronary artery atherosclerosis. 4. Prominent cystic lesions in both kidney upper poles. Left kidney upper pole cystic lesion is large and somewhat irregular. This could be from polycystic kidney disease although cystic renal masses cannot be excluded and only the very top sub the kidneys were included. Correlate with patient history in determining need for further nonemergent imaging workup. 5. Thoracic spondylosis.   Electronically Signed   By: Sherryl Barters M.D.   On: 08/12/2013 20:21    My personal review of EKG: Rhythm NSR, at 72 beats per  minute with PACs.   Assessment & Plan    1. syncopal episode with collapse; patient loss consciousness for around one hour. Workup in July with echo and carotid Dopplers were within normal limits. Patient has history of chronic obstructive hydrocephalus and Parkinson's 2. Chronic obstructive hydrocephalus 3. History of hypertension 4. History of hyperlipidemia 5. History of ulcerative colitis 6. Right 3-6 rib fractures  Plan Admit the patient to telemetry Rule out MI Neurological consult evaluation by physical therapy Continue home medications Pain control Trauma surgery to follow for his rib fractures   DVT Prophylaxis Lovenox  AM  Labs Ordered, also please review Full Orders  Family Communication: Admission, patients condition and plan of care including tests being ordered have been discussed with the patient who indicates understanding and agrees with the plan and Code Status.  Code Status full  Disposition Plan: Home  Time spent in minutes : 35  Condition GUARDED

## 2013-08-12 NOTE — ED Notes (Addendum)
Pt reports he fainted today and fell on the floor this morning right after he was eating breakfast, sts he was still sitting down when it happened, hit head has redness above right eyebrow. denies pain to head. Hx of syncopal episodes, sts his last one was in July, unknown cause, reports once it was because he stood up too quickly. Pt c/o pain from top of chest into mid abd only on palpation. Pt is a&o at this time. Nad, skin warm and dry, resp e/u.

## 2013-08-12 NOTE — ED Notes (Signed)
Pt ambulated to BR with assistance.

## 2013-08-12 NOTE — ED Provider Notes (Signed)
CSN: [id number]     Arrival date & time 08/12/13  1608 History   First MD Initiated Contact with Patient 08/12/13 1650     Chief Complaint  Patient presents with  . Loss of Consciousness   HPI  73 y/o male who presents after syncopal episode. The patient states he has been in his usual state health over the last several days. This morning the patient had a syncopal episode. The patient states he bent over and passed out. The patient states he woke up face down and saw where he had vomited. The patient is unsure of how long he was down for. EMS was called but the patient states he was "cleared". He states that since that time he has developed chest pain and it will not improve. The patient states he is having diffuse chest pain. He states movement and palpation makes it worse. He states that when he lays still he doesn't have the pain.   Past Medical History  Diagnosis Date  . Memory loss   . Benign paroxysmal positional vertigo   . Other and unspecified hyperlipidemia   . Unspecified essential hypertension   . Obstructive hydrocephalus   . Abnormality of gait   . Basal cell carcinoma     "primarily on my face; I've had a bunch" (02/25/2013)  . 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 Asthma     "I've outgrown that now; I was on allergy shots for awhile" (02/25/2013)  . Arthritis     "left knee" (02/25/2013)  . 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 Ulcerative colitis    Past Surgical History  Procedure Laterality Date  . Suprapubic prostatectomy  2009  . Basal cell carcinoma excision Left 2000's    points to nasal fold  (02/25/2013)   Family History  Problem Relation Age of Onset  . Heart failure Father   . Dementia Mother    History  Substance Use Topics  . Smoking status: Former Smoker -- 8 years    Types: Pipe    Quit date: 04/04/1998  . Smokeless tobacco: Never Used     Comment: 02/25/2013 "quit  smoking > 20 yr ago"  . Alcohol Use: No    Review of Systems  Constitutional: Negative for fever and chills.  Respiratory: Negative for cough and shortness of breath.   Cardiovascular: Positive for chest pain.  Gastrointestinal: Negative for nausea, vomiting and abdominal pain.  Musculoskeletal: Negative for neck pain.  Neurological: Negative for weakness, numbness and headaches.  All other systems reviewed and are negative.   Allergies  Lexapro and Tetracyclines & related  Home Medications   Current Outpatient Rx  Name  Route  Sig  Dispense  Refill  . acetaminophen (TYLENOL) 500 MG tablet   Oral   Take 500 mg by mouth 2 (two) times daily.         . carbidopa-levodopa (SINEMET IR) 25-100 MG per tablet   Oral   Take 1 tablet by mouth 3 (three) times daily.   270 tablet   1   . cyclobenzaprine (FLEXERIL) 10 MG tablet   Oral   Take 5 mg by mouth at bedtime.          . donepezil (ARICEPT) 5 MG tablet   Oral   Take 1 tablet (5 mg total) by mouth daily.   90 tablet   1   . mesalamine (  APRISO) 0.375 G 24 hr capsule   Oral   Take 375 mg by mouth every morning. Each morning         . valsartan (DIOVAN) 160 MG tablet   Oral   Take 80 mg by mouth daily.           BP 159/88  Pulse 67  Temp(Src) 97.5 F (36.4 C) (Oral)  Resp 15  Ht 5\' 6"  (1.676 m)  Wt 158 lb (71.668 kg)  BMI 25.51 kg/m2  SpO2 98%  Physical Exam  Constitutional: He is oriented to person, place, and time. He appears well-developed and well-nourished. No distress.  HENT:  Head: Normocephalic and atraumatic.  Mouth/Throat: No oropharyngeal exudate.  Eyes: Conjunctivae are normal. Pupils are equal, round, and reactive to light.  Neck: Normal range of motion. Neck supple.  Cardiovascular: Normal rate and normal heart sounds.  Exam reveals no gallop and no friction rub.   No murmur heard. Pulmonary/Chest: Effort normal and breath sounds normal. He exhibits bony tenderness.    Abdominal:  Soft. He exhibits no distension. There is no tenderness.  Musculoskeletal: Normal range of motion. He exhibits no edema and no tenderness.       Cervical back: He exhibits no bony tenderness.       Thoracic back: He exhibits no bony tenderness.       Lumbar back: He exhibits no bony tenderness.  Neurological: He is alert and oriented to person, place, and time. He has normal strength and normal reflexes. No cranial nerve deficit or sensory deficit. Coordination normal. GCS eye subscore is 4. GCS verbal subscore is 5. GCS motor subscore is 6.  Skin: Skin is warm and dry.   ED Course  Procedures (including critical care time) Labs Review Labs Reviewed  BASIC METABOLIC PANEL - Abnormal; Notable for the following:    Glucose, Bld 109 (*)    All other components within normal limits  CBC  PRO B NATRIURETIC PEPTIDE  POCT I-STAT TROPONIN I  Imaging Review Dg Chest 2 View  08/12/2013   CLINICAL DATA:  Loss of consciousness.  Chest pain.  EXAM: CHEST  2 VIEW  COMPARISON:  02/22/2008  FINDINGS: Heart size is normal. The aorta is unremarkable. The lungs are clear. The vascularity is normal. No effusions. Chronic degenerative changes affect the spine.  IMPRESSION: No active cardiopulmonary disease. No cause of chest pain identified.   Electronically Signed   By: Nelson Chimes M.D.   On: 08/12/2013 17:09   Ct Head Wo Contrast  08/12/2013   CLINICAL DATA:  Loss of consciousness.  EXAM: CT HEAD WITHOUT CONTRAST  TECHNIQUE: Contiguous axial images were obtained from the base of the skull through the vertex without intravenous contrast.  COMPARISON:  02/25/2013.  FINDINGS: Stable noncommunicating hydrocephalus with marked dilatation of the lateral and 3rd ventricles. 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 No extra-axial fluid collections are identified. No acute intracranial findings or mass lesions. The brainstem and cerebellum are grossly normal and stable.  The bony  structures are intact. The paranasal sinuses and mastoid air cells are clear.  IMPRESSION: Chronic noncommunicating hydrocephalus.  No acute intracranial findings.   Electronically Signed   By: Kalman Jewels M.D.   On: 08/12/2013 20:10   Ct Angio Chest Pe W/cm &/or Wo Cm  08/12/2013   CLINICAL DATA:  Chest trauma.  Fall.  EXAM: CT ANGIOGRAPHY CHEST WITH CONTRAST  TECHNIQUE: Multidetector CT imaging of the  chest was performed using the standard protocol during bolus administration of intravenous contrast. Multiplanar CT image reconstructions including MIPs were obtained to evaluate the vascular anatomy.  CONTRAST:  176mL OMNIPAQUE IOHEXOL 350 MG/ML SOLN  COMPARISON:  None.  FINDINGS: Clinical data specifically requested concern for pulmonary embolus rather than dissection, and accordingly today's exam was performed with pulmonary arterial contrast out demise a shin rather than systemic arterial assessment. Today's exam does not rule out aortic dissection.  No filling defect is identified in the pulmonary arterial tree to suggest pulmonary embolus. No pericardial effusion or significant pleural effusion. Large irregular cystic lesions at the tops of the kidneys noted, possibly from polycystic kidney disease although malignancy is not excluded, and the 9.8 cm left kidney upper pole cyst has somewhat irregular margins and possible internal septations.  Hypodense lesion in the lateral segment left hepatic lobe, image 73 of series 4, 1.5 x 1.2 cm.  Coronary artery atherosclerotic calcification is suspected.  Thoracic spondylosis noted.  No definite sternal fracture.  Nondisplaced fractures of the right 3rd, 4th, 5th, and 6th ribs. There is dependent subsegmental atelectasis in both lower lobes.  Review of the MIP images confirms the above findings.  IMPRESSION: 1. No embolus identified. Today's exam is not sensitive for aortic dissection due to Pulmonary rather than systemic arterial enhancement. 2. Nondisplaced  fractures of the right 3rd, 4th, 5th, and 6th ribs are seen. 3. Coronary artery atherosclerosis. 4. Prominent cystic lesions in both kidney upper poles. Left kidney upper pole cystic lesion is large and somewhat irregular. This could be from polycystic kidney disease although cystic renal masses cannot be excluded and only the very top sub the kidneys were included. Correlate with patient history in determining need for further nonemergent imaging workup. 5. Thoracic spondylosis.   Electronically Signed   By: Sherryl Barters M.D.   On: 08/12/2013 20:21   EKG Interpretation   None      MDM    EKG with no acute changes. CBC and BMP unremarkable. Initial troponin negative. CXR without acute abnormality. Given severe rib tenderness there was concern for underlying fractures. A CT chest was obtained to better look for rib fractures as well as evaluate for PE/Dissection. CT revealed multiple bilateral rib fractures secondary to the fall. CT head was negative. Trauma surgery was consulted and will follow in house in regards to the patient's rib fractures. The patient was admitted to internal medicine for continued pain management as well as further evaluation of syncope.   1. Rib fracture, unspecified laterality, closed, initial encounter   2. Abnormality of gait   3. Syncope and collapse   4. Unspecified essential hypertension        Donita Brooks, MD 08/12/13 [phone]

## 2013-08-12 NOTE — ED Notes (Signed)
Pt returned from radiology.

## 2013-08-12 NOTE — ED Notes (Signed)
Pt reports this am he felt very weak and he bent over and then he passed out. States he woke an hour later in the floor and called for wife and she called ems. They came to house and checked the pt and "they said everything was normal" so he didn't go to hospital at the time. States he decided to come in because he has been having CP since the LOC and it will not resolve. He is A&Ox4, resp e/u

## 2013-08-12 NOTE — Consult Note (Signed)
Reason for Consult: Syncopal episode - fall - rib fractures Referring Physician: Winfred Leeds, EDP  [name] is an 73 y.o. male.  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  He reported that he bent over and then woke up on the floor.  Apparently, he vomited as well.  Unknown length of unconscious episode since he was alone.  He presents complaining of anterior bilateral chest pain with movement and palpation.  No pain when he is still.    Past Medical History  Diagnosis Date  . Memory loss   . Benign paroxysmal positional vertigo   . Other and unspecified hyperlipidemia   . Unspecified essential hypertension   . Obstructive hydrocephalus   . Abnormality of gait   . Basal cell carcinoma     "primarily on my face; I've had a bunch" (02/25/2013)  . 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 Asthma     "I've outgrown that now; I was on allergy shots for awhile" (02/25/2013)  . Arthritis     "left knee" (02/25/2013)  . 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 Ulcerative colitis     Past Surgical History  Procedure Laterality Date  . Suprapubic prostatectomy  2009  . Basal cell carcinoma excision Left 2000's    points to nasal fold  (02/25/2013)    Family History  Problem Relation Age of Onset  . Heart failure Father   . Dementia Mother     Social History:  reports that he quit smoking about 15 years ago. His smoking use included Pipe. He has never used smokeless tobacco. He reports that he does not drink alcohol or use illicit drugs. Retired [employer] Married - lives at home with his wife  Allergies:  Allergies  Allergen Reactions  . Lexapro [Escitalopram Oxalate] Other (See Comments)    Unknown: reaction occurred some time ago and pt cannot  remember what it was  . Tetracyclines & Related Other (See Comments)    Unknown: Reaction occurred some time ago and pt cannot remember what it was     Medications:   Prior to Admission medications   Medication Sig Start Date End Date Taking? Authorizing Provider  acetaminophen (TYLENOL) 500 MG tablet Take 500 mg by mouth 2 (two) times daily.   Yes Historical Provider, MD  carbidopa-levodopa (SINEMET IR) 25-100 MG per tablet Take 1 tablet by mouth 3 (three) times daily. 04/04/13  Yes Rebecca S Tat, DO  cyclobenzaprine (FLEXERIL) 10 MG tablet Take 5 mg by mouth at bedtime.    Yes Historical Provider, MD  donepezil (ARICEPT) 5 MG tablet Take 1 tablet (5 mg total) by mouth daily. 01/04/13  Yes Star Age, MD  mesalamine (APRISO) 0.375 G 24 hr capsule Take 375 mg by mouth every morning. Each morning   Yes Historical Provider, MD  valsartan (DIOVAN) 160 MG tablet Take 80 mg by mouth daily.    Yes Historical Provider, MD     Results for orders placed during the hospital encounter of 08/12/13 (from the past 48 hour(s))  CBC     Status: None   Collection Time    08/12/13  6:10 PM      Result Value Range   WBC 9.1  4.0 - 10.5 K/uL   RBC 5.06  4.22 - 5.81 MIL/uL   Hemoglobin 16.4  13.0 - 17.0 g/dL   HCT 47.2  39.0 - 52.0 %   MCV 93.3  78.0 - 100.0 fL   MCH 32.4  26.0 - 34.0 pg   MCHC 34.7  30.0 - 36.0 g/dL   RDW 13.0  11.5 - 15.5 %   Platelets 209  150 - 400 K/uL  BASIC METABOLIC PANEL     Status: Abnormal   Collection Time    08/12/13  6:10 PM      Result Value Range   Sodium 139  137 - 147 mEq/L   Comment: Please note change in reference range.   Potassium 4.1  3.7 - 5.3 mEq/L   Comment: Please note change in reference range.   Chloride 99  96 - 112 mEq/L   CO2 27  19 - 32 mEq/L   Glucose, Bld 109 (*) 70 - 99 mg/dL   BUN 13  6 - 23 mg/dL   Creatinine, Ser 0.65  0.50 - 1.35 mg/dL   Calcium 9.9  8.4 - 10.5 mg/dL   GFR calc non Af Amer >90  >90 mL/min   GFR calc Af Amer >90  >90  mL/min   Comment: (NOTE)     The eGFR has been calculated using the CKD EPI equation.     This calculation has not been validated in all clinical situations.     eGFR's persistently <90 mL/min signify possible Chronic Kidney     Disease.  PRO B NATRIURETIC PEPTIDE     Status: None   Collection Time    08/12/13  6:10 PM      Result Value Range   Pro B Natriuretic peptide (BNP) 83.7  0 - 125 pg/mL  POCT I-STAT TROPONIN I     Status: None   Collection Time    08/12/13  6:18 PM      Result Value Range   Troponin i, poc 0.01  0.00 - 0.08 ng/mL   Comment 3            Comment: Due to the release kinetics of cTnI,     a negative result within the first hours     of the onset of symptoms does not rule out     myocardial infarction with certainty.     If myocardial infarction is still suspected,     repeat the test at appropriate intervals.    Dg Chest 2 View  08/12/2013   CLINICAL DATA:  Loss of consciousness.  Chest pain.  EXAM: CHEST  2 VIEW  COMPARISON:  02/22/2008  FINDINGS: Heart size is normal. The aorta is unremarkable. The lungs are clear. The vascularity is normal. No effusions. Chronic degenerative changes affect the spine.  IMPRESSION: No active cardiopulmonary disease. No cause of chest pain identified.   Electronically Signed   By: Nelson Chimes M.D.   On: 08/12/2013 17:09   Ct Head Wo Contrast  08/12/2013   CLINICAL DATA:  Loss of consciousness.  EXAM: CT HEAD WITHOUT CONTRAST  TECHNIQUE: Contiguous axial images were obtained from the base of the skull through the vertex without intravenous contrast.  COMPARISON:  02/25/2013.  FINDINGS: Stable noncommunicating hydrocephalus with marked dilatation of the lateral and 3rd ventricles. 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 No extra-axial fluid collections are identified. No acute intracranial findings or mass lesions. The brainstem and cerebellum are grossly normal and stable.  The  bony structures  are intact. The paranasal sinuses and mastoid air cells are clear.  IMPRESSION: Chronic noncommunicating hydrocephalus.  No acute intracranial findings.   Electronically Signed   By: Kalman Jewels M.D.   On: 08/12/2013 20:10   Ct Angio Chest Pe W/cm &/or Wo Cm  08/12/2013   CLINICAL DATA:  Chest trauma.  Fall.  EXAM: CT ANGIOGRAPHY CHEST WITH CONTRAST  TECHNIQUE: Multidetector CT imaging of the chest was performed using the standard protocol during bolus administration of intravenous contrast. Multiplanar CT image reconstructions including MIPs were obtained to evaluate the vascular anatomy.  CONTRAST:  168m OMNIPAQUE IOHEXOL 350 MG/ML SOLN  COMPARISON:  None.  FINDINGS: Clinical data specifically requested concern for pulmonary embolus rather than dissection, and accordingly today's exam was performed with pulmonary arterial contrast out demise a shin rather than systemic arterial assessment. Today's exam does not rule out aortic dissection.  No filling defect is identified in the pulmonary arterial tree to suggest pulmonary embolus. No pericardial effusion or significant pleural effusion. Large irregular cystic lesions at the tops of the kidneys noted, possibly from polycystic kidney disease although malignancy is not excluded, and the 9.8 cm left kidney upper pole cyst has somewhat irregular margins and possible internal septations.  Hypodense lesion in the lateral segment left hepatic lobe, image 73 of series 4, 1.5 x 1.2 cm.  Coronary artery atherosclerotic calcification is suspected.  Thoracic spondylosis noted.  No definite sternal fracture.  Nondisplaced fractures of the right 3rd, 4th, 5th, and 6th ribs. There is dependent subsegmental atelectasis in both lower lobes.  Review of the MIP images confirms the above findings.  IMPRESSION: 1. No embolus identified. Today's exam is not sensitive for aortic dissection due to Pulmonary rather than systemic arterial enhancement. 2. Nondisplaced fractures of the  right 3rd, 4th, 5th, and 6th ribs are seen. 3. Coronary artery atherosclerosis. 4. Prominent cystic lesions in both kidney upper poles. Left kidney upper pole cystic lesion is large and somewhat irregular. This could be from polycystic kidney disease although cystic renal masses cannot be excluded and only the very top sub the kidneys were included. Correlate with patient history in determining need for further nonemergent imaging workup. 5. Thoracic spondylosis.   Electronically Signed   By: WSherryl BartersM.D.   On: 08/12/2013 20:21    ROS Blood pressure 160/88, pulse 72, temperature 97.5 F (36.4 C), temperature source Oral, resp. rate 16, height 5' 6"  (1.676 m), weight 158 lb (71.668 kg), SpO2 100.00%. Physical Exam  Vitals reviewed. Constitutional: He is oriented to person, place, and time. He appears well-developed and well-nourished. He is cooperative. No distress.  HENT:  Head: Normocephalic and atraumatic. Head is without raccoon's eyes, without Battle's sign, without abrasion, without contusion and without laceration.  Right Ear: Hearing, tympanic membrane, external ear and ear canal normal. No lacerations. No drainage or tenderness. No foreign bodies. Tympanic membrane is not perforated. No hemotympanum.  Left Ear: Hearing, tympanic membrane, external ear and ear canal normal. No lacerations. No drainage or tenderness. No foreign bodies. Tympanic membrane is not perforated. No hemotympanum.  Nose: No nose lacerations, sinus tenderness, nasal deformity or nasal septal hematoma. No epistaxis.  Mouth/Throat: Uvula is midline, oropharynx is clear and moist and mucous membranes are normal. No lacerations.  Eyes: Conjunctivae, EOM and lids are normal. Pupils are equal, round, and reactive to light. No scleral icterus.  Neck: Trachea normal. No JVD present. No spinous process tenderness and no muscular tenderness present. Carotid bruit is  not present. No thyromegaly present.  Cardiovascular:  Normal rate, regular rhythm, normal heart sounds, intact distal pulses and normal pulses.   Respiratory: Effort normal and breath sounds normal. No respiratory distress. He exhibits tenderness and bony tenderness (bilateral anterior chest R>L). He exhibits no laceration and no crepitus.  GI: Soft. Normal appearance and bowel sounds are normal. He exhibits no distension. There is no tenderness. There is no rigidity, no rebound, no guarding and no CVA tenderness.  Musculoskeletal: Normal range of motion. He exhibits no edema and no tenderness.  Lymphadenopathy:    He has no cervical adenopathy.  Neurological: He is alert and oriented to person, place, and time. He has normal strength. No cranial nerve deficit or sensory deficit. GCS eye subscore is 4. GCS verbal subscore is 5. GCS motor subscore is 6.  Skin: Skin is warm, dry and intact. He is not diaphoretic.  Psychiatric: He has a normal mood and affect. His speech is normal and behavior is normal.     Assessment/Plan: Syncope - internal medicine to admit for further work-up and management plan Fall with loss of consciousness but evidence of acute brain trauma Anterior rib fractures right 3-6  Recommend judicious pain medication with mobilization (with supervision) and aggressive use of incentive spirometer. If he is able to tolerate it, consider using Toradol in addition to sporadic narcotics. Follow-up CXR tomorrow morning  I briefly discussed with the patient that he and his family may have to have further discussions regarding his living situation in light of his repeated syncopal episodes and subsequent injury.  Will leave further discussions to the primary team.  Trauma team will follow while he is in the hospital.     Daiden Coltrane K. 08/12/2013, 9:10 PM

## 2013-08-12 NOTE — ED Notes (Signed)
Pt's friend at bedside reports - we need to call the pt's wife because she has background information she wants to tell us and has been wondering what is going on. Pt and friend informed that we need to assess pt and get labs/xray before we can be sure of what is going on with the pt.

## 2013-08-12 NOTE — ED Provider Notes (Signed)
Patient had syncopal event today. He fell striking his head. He denies headache denies neck pain he admits to chest pain since the event. No chest pain before the event chest pain is diffuse and worse when he takes deep breath and worse with touching the area. Denies abdominal pain denies other complaint. Patient has had prior to syncopal event with inpatient workup, etiology unclear  Date: 08/12/2013  Rate: 75  Rhythm: normal sinus rhythm  QRS Axis: normal  Intervals: normal  ST/T Wave abnormalities: normal  Conduction Disutrbances:none  Narrative Interpretation: tall R wave in V2  Old EKG Reviewed: Unchanged from 02/25/2013 interpreted by me    Orlie Dakin, MD 08/13/13 [phone]

## 2013-08-13 ENCOUNTER — Inpatient Hospital Stay (HOSPITAL_COMMUNITY): Payer: Medicare Other

## 2013-08-13 ENCOUNTER — Encounter (HOSPITAL_COMMUNITY): Payer: Self-pay

## 2013-08-13 DIAGNOSIS — G911 Obstructive hydrocephalus: Secondary | ICD-10-CM

## 2013-08-13 LAB — CK: Total CK: 198 U/L (ref 7–232)

## 2013-08-13 LAB — CREATININE, SERUM
Creatinine, Ser: 0.65 mg/dL (ref 0.50–1.35)
GFR calc Af Amer: >90 mL/min (ref >90)
GFR calc non Af Amer: >90 mL/min (ref >90)

## 2013-08-13 LAB — CBC
HCT: 42.9 % (ref 39.0–52.0)
Hemoglobin: 14.5 g/dL (ref 13.0–17.0)
MCH: 31.3 pg (ref 26.0–34.0)
MCHC: 33.8 g/dL (ref 30.0–36.0)
MCV: 92.5 fL (ref 78.0–100.0)
Platelets: 186 10*3/uL (ref 150–400)
RBC: 4.64 MIL/uL (ref 4.22–5.81)
RDW: 12.9 % (ref 11.5–15.5)
WBC: 8.9 10*3/uL (ref 4.0–10.5)

## 2013-08-13 MED ORDER — MESALAMINE 400 MG PO CPDR
400.0000 mg | DELAYED_RELEASE_CAPSULE | Freq: Every day | ORAL | Status: DC
  Administered 2013-08-13 – 2013-08-15 (×3): 400 mg via ORAL
  Filled 2013-08-13 (×3): qty 1

## 2013-08-13 MED ORDER — HYDROMORPHONE HCL PF 1 MG/ML IJ SOLN: 0.5000 mg | INTRAMUSCULAR | Status: DC | PRN

## 2013-08-13 MED ORDER — DOCUSATE SODIUM 100 MG PO CAPS
100.0000 mg | ORAL_CAPSULE | Freq: Two times a day (BID) | ORAL | Status: DC
  Administered 2013-08-13 – 2013-08-14 (×3): 100 mg via ORAL
  Filled 2013-08-13 (×5): qty 1

## 2013-08-13 MED ORDER — BISACODYL 10 MG RE SUPP: 10.0000 mg | Freq: Every day | RECTAL | Status: DC | PRN

## 2013-08-13 NOTE — Consult Note (Signed)
NEURO HOSPITALIST CONSULT NOTE    Reason for Consult: recurrent syncope  HPI:                                                                                                                                          [name] is an 73 y.o. male with a past medical history significant for asthma, ulcerative colitis, BPPV with last attack more than 1 year ago, aqueductal stenosis with obstructive hydrocephalus, HTN, basal cell carcinoma of the face, chronic gait impairment/PD, cognitive impairment , and recurrent fainting spells. He indicated that he started fainting approximately 1 year ago and had had total of 4 fainting episodes, the last one yesterday and the first 2 episodes while standing up in church. Had bladder incontinence associated with one of the fainting episodes [name] said that he had had 2 of those episodes while standing and the last 2 episodes while sitting. He said that yesterday he was sitting at the breakfast table, bent over and passed out. The patient states he woke up face down and saw where he had vomited. The patient is unsure of how long he was down. He recalls that typically he will get very hot and sweaty during those episodes and said that he had bladder incontinence once. Stressed the fact that he regains consciousness very rapidly and afterwards he is not confused or disoriented. [name] was previously under the care of Dr. Erling Cruz who treated him with Levodopa 25/100 mg TID which he thinks has helped significantly. Most recently seen by [name] and I reviewed her note. He denies side effects on Levodopa and think that his motor function is not getting any worse. He uses a cane at home and a walker when he goes out. Denies associated palpitations, chest pain, shortness of breath, HA, vertigo, focal weakness or numbness,slurred speech, language or vision impairment. No recent change in medications. Said that he has been taking Donepezil for  approximately a year. Previous syncope work up here at [facility] was unrevealing. CT brain during this admission showed stable noncommunicating hydrocephalus with marked dilatation of the  lateral and 3rd ventricles. 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    Past Medical History  Diagnosis Date  . Memory loss   . Benign paroxysmal positional vertigo   . Other and unspecified hyperlipidemia   . Unspecified essential hypertension   . Obstructive hydrocephalus   . Abnormality of gait   . Basal cell carcinoma     "primarily on my face; I've had a bunch" (02/25/2013)  . 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 Asthma     "I've outgrown that now; I was on allergy shots for awhile" (  02/25/2013)  . Arthritis     "left knee" (02/25/2013)  . 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 Ulcerative colitis     Past Surgical History  Procedure Laterality Date  . Suprapubic prostatectomy  2009  . Basal cell carcinoma excision Left 2000's    points to nasal fold  (02/25/2013)    Family History  Problem Relation Age of Onset  . Heart failure Father   . Dementia Mother    Social History:  reports that he quit smoking about 15 years ago. His smoking use included Pipe. He has never used smokeless tobacco. He reports that he does not drink alcohol or use illicit drugs.  Allergies  Allergen Reactions  . Lexapro [Escitalopram Oxalate] Other (See Comments)    Unknown: reaction occurred some time ago and pt cannot remember what it was  . Tetracyclines & Related Other (See Comments)    Unknown: Reaction occurred some time ago and pt cannot remember what it was     MEDICATIONS:                                                                                                                     I have reviewed the patient's current medications.   ROS:                                                                                                                                        History obtained from the patient and chart review.  General ROS: negative for - chills, fatigue, fever, night sweats, weight gain or weight loss Psychological ROS: negative for - behavioral disorder, hallucinations, mood swings or suicidal ideation Ophthalmic ROS: negative for - blurry vision, double vision, eye pain or loss of vision ENT ROS: negative for - epistaxis, nasal discharge, oral lesions, sore throat, tinnitus or vertigo Allergy and Immunology ROS: negative for - hives or itchy/watery eyes Hematological and Lymphatic ROS: negative for - bleeding problems, bruising or swollen lymph nodes Endocrine ROS: negative for - galactorrhea, hair pattern changes, polydipsia/polyuria or temperature intolerance Respiratory ROS: negative for - cough, hemoptysis, shortness of breath or wheezing Cardiovascular ROS: negative for - chest pain, dyspnea on exertion, edema or irregular heartbeat Gastrointestinal ROS: negative for - abdominal pain, diarrhea, hematemesis, nausea/vomiting or stool incontinence Genito-Urinary ROS: negative for - dysuria, hematuria, incontinence or urinary frequency/urgency Musculoskeletal  ROS: negative for - joint swelling or muscular weakness Neurological ROS: as noted in HPI Dermatological ROS: negative for rash and skin lesion changes   Physical exam: pleasant male in no apparent distress.Blood pressure 159/71, pulse 75, temperature 99.2 F (37.3 C), temperature source Oral, resp. rate 20, height 5' 6"  (1.676 m), weight 70.4 kg (155 lb 3.3 oz), SpO2 96.00%. Head: normocephalic. Neck: supple, no bruits, no JVD. Cardiac: no murmurs. Lungs: clear. Abdomen: soft, no tender, no mass. Extremities: no edema. CV: pulses palpable throughout  Neurologic Examination:                                                                                                      Mental Status: Alert,  awake, oriented x 4, thought content appropriate.  Speech fluent without evidence of aphasia.  Able to follow 3 step commands without difficulty. Cranial Nerves: II: Discs flat bilaterally; Visual fields grossly normal, pupils equal, round, reactive to light and accommodation III,IV, VI: ptosis not present, extra-ocular motions intact bilaterally V,VII: smile symmetric, facial light touch sensation normal bilaterally VIII: hearing normal bilaterally IX,X: gag reflex present XI: bilateral shoulder shrug XII: midline tongue extension without atrophy or fasciculations  Motor: Right : Upper extremity   5/5    Left:     Upper extremity   5/5  Lower extremity   5/5     Lower extremity   5/5 Tone and bulk:normal tone throughout; no atrophy noted Sensory: Pinprick and light touch intact throughout, bilaterally Deep Tendon Reflexes:  Right: Upper Extremity   Left: Upper extremity   biceps (C-5 to C-6) 2/4   biceps (C-5 to C-6) 2/4 tricep (C7) 2/4    triceps (C7) 2/4 Brachioradialis (C6) 2/4  Brachioradialis (C6) 2/4  Lower Extremity Lower Extremity  quadriceps (L-2 to L-4) 2/4   quadriceps (L-2 to L-4) 2/4 Achilles (S1) 2/4   Achilles (S1) 2/4  Plantars: Right: downgoing   Left: downgoing Cerebellar: normal finger-to-nose,  normal heel-to-shin test Gait:  He has difficulty getting up from bed and initiating gait. Then, he displays a wide based shuffling gait and turns in bloc. I was not able to observe freezing of gait.    No results found for this basename: cbc, bmp, coags, chol, tri, ldl, hga1c    Results for orders placed during the hospital encounter of 08/12/13 (from the past 48 hour(s))  CBC     Status: None   Collection Time    08/12/13  6:10 PM      Result Value Range   WBC 9.1  4.0 - 10.5 K/uL   RBC 5.06  4.22 - 5.81 MIL/uL   Hemoglobin 16.4  13.0 - 17.0 g/dL   HCT 47.2  39.0 - 52.0 %   MCV 93.3  78.0 - 100.0 fL   MCH 32.4  26.0 - 34.0 pg   MCHC 34.7  30.0 - 36.0 g/dL    RDW 13.0  11.5 - 15.5 %   Platelets 209  150 - 400 K/uL  BASIC METABOLIC PANEL     Status: Abnormal  Collection Time    08/12/13  6:10 PM      Result Value Range   Sodium 139  137 - 147 mEq/L   Comment: Please note change in reference range.   Potassium 4.1  3.7 - 5.3 mEq/L   Comment: Please note change in reference range.   Chloride 99  96 - 112 mEq/L   CO2 27  19 - 32 mEq/L   Glucose, Bld 109 (*) 70 - 99 mg/dL   BUN 13  6 - 23 mg/dL   Creatinine, Ser 0.65  0.50 - 1.35 mg/dL   Calcium 9.9  8.4 - 10.5 mg/dL   GFR calc non Af Amer >90  >90 mL/min   GFR calc Af Amer >90  >90 mL/min   Comment: (NOTE)     The eGFR has been calculated using the CKD EPI equation.     This calculation has not been validated in all clinical situations.     eGFR's persistently <90 mL/min signify possible Chronic Kidney     Disease.  PRO B NATRIURETIC PEPTIDE     Status: None   Collection Time    08/12/13  6:10 PM      Result Value Range   Pro B Natriuretic peptide (BNP) 83.7  0 - 125 pg/mL  POCT I-STAT TROPONIN I     Status: None   Collection Time    08/12/13  6:18 PM      Result Value Range   Troponin i, poc 0.01  0.00 - 0.08 ng/mL   Comment 3            Comment: Due to the release kinetics of cTnI,     a negative result within the first hours     of the onset of symptoms does not rule out     myocardial infarction with certainty.     If myocardial infarction is still suspected,     repeat the test at appropriate intervals.  CBC     Status: None   Collection Time    08/13/13  1:55 AM      Result Value Range   WBC 8.9  4.0 - 10.5 K/uL   RBC 4.64  4.22 - 5.81 MIL/uL   Hemoglobin 14.5  13.0 - 17.0 g/dL   HCT 42.9  39.0 - 52.0 %   MCV 92.5  78.0 - 100.0 fL   MCH 31.3  26.0 - 34.0 pg   MCHC 33.8  30.0 - 36.0 g/dL   RDW 12.9  11.5 - 15.5 %   Platelets 186  150 - 400 K/uL  CREATININE, SERUM     Status: None   Collection Time    08/13/13  1:55 AM      Result Value Range   Creatinine, Ser  0.65  0.50 - 1.35 mg/dL   GFR calc non Af Amer >90  >90 mL/min   GFR calc Af Amer >90  >90 mL/min   Comment: (NOTE)     The eGFR has been calculated using the CKD EPI equation.     This calculation has not been validated in all clinical situations.     eGFR's persistently <90 mL/min signify possible Chronic Kidney     Disease.  CK     Status: None   Collection Time    08/13/13  1:55 AM      Result Value Range   Total CK 198  7 - 232 U/L    Dg Chest 2 View  08/13/2013   CLINICAL DATA:  Fall, multiple rib fractures  EXAM: CHEST  2 VIEW  COMPARISON:  CT chest 08/12/2013  FINDINGS: The lungs are clear. No pneumothorax or pleural effusion. Nondisplaced rib fractures better seen on recent CT scan. Cardiac and mediastinal contours are within normal limits. Nonspecific visualized bowel gas pattern. Multilevel degenerative change throughout the spine.  IMPRESSION: No active cardiopulmonary disease. Specifically, no pneumothorax or pleural effusion.  Nondisplaced right-sided rib fractures better seen on yesterday's CT scan.   Electronically Signed   By: Jacqulynn Cadet M.D.   On: 08/13/2013 12:21   Dg Chest 2 View  08/12/2013   CLINICAL DATA:  Loss of consciousness.  Chest pain.  EXAM: CHEST  2 VIEW  COMPARISON:  02/22/2008  FINDINGS: Heart size is normal. The aorta is unremarkable. The lungs are clear. The vascularity is normal. No effusions. Chronic degenerative changes affect the spine.  IMPRESSION: No active cardiopulmonary disease. No cause of chest pain identified.   Electronically Signed   By: Nelson Chimes M.D.   On: 08/12/2013 17:09   Ct Head Wo Contrast  08/12/2013   CLINICAL DATA:  Loss of consciousness.  EXAM: CT HEAD WITHOUT CONTRAST  TECHNIQUE: Contiguous axial images were obtained from the base of the skull through the vertex without intravenous contrast.  COMPARISON:  02/25/2013.  FINDINGS: Stable noncommunicating hydrocephalus with marked dilatation of the lateral and 3rd ventricles. 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 No extra-axial fluid collections are identified. No acute intracranial findings or mass lesions. The brainstem and cerebellum are grossly normal and stable.  The bony structures are intact. The paranasal sinuses and mastoid air cells are clear.  IMPRESSION: Chronic noncommunicating hydrocephalus.  No acute intracranial findings.   Electronically Signed   By: Kalman Jewels M.D.   On: 08/12/2013 20:10   Ct Angio Chest Pe W/cm &/or Wo Cm  08/12/2013   CLINICAL DATA:  Chest trauma.  Fall.  EXAM: CT ANGIOGRAPHY CHEST WITH CONTRAST  TECHNIQUE: Multidetector CT imaging of the chest was performed using the standard protocol during bolus administration of intravenous contrast. Multiplanar CT image reconstructions including MIPs were obtained to evaluate the vascular anatomy.  CONTRAST:  145m OMNIPAQUE IOHEXOL 350 MG/ML SOLN  COMPARISON:  None.  FINDINGS: Clinical data specifically requested concern for pulmonary embolus rather than dissection, and accordingly today's exam was performed with pulmonary arterial contrast out demise a shin rather than systemic arterial assessment. Today's exam does not rule out aortic dissection.  No filling defect is identified in the pulmonary arterial tree to suggest pulmonary embolus. No pericardial effusion or significant pleural effusion. Large irregular cystic lesions at the tops of the kidneys noted, possibly from polycystic kidney disease although malignancy is not excluded, and the 9.8 cm left kidney upper pole cyst has somewhat irregular margins and possible internal septations.  Hypodense lesion in the lateral segment left hepatic lobe, image 73 of series 4, 1.5 x 1.2 cm.  Coronary artery atherosclerotic calcification is suspected.  Thoracic spondylosis noted.  No definite sternal fracture.  Nondisplaced fractures of the right 3rd, 4th, 5th, and 6th ribs. There is dependent subsegmental atelectasis in both  lower lobes.  Review of the MIP images confirms the above findings.  IMPRESSION: 1. No embolus identified. Today's exam is not sensitive for aortic dissection due to Pulmonary rather than systemic arterial enhancement. 2. Nondisplaced fractures of the right 3rd, 4th, 5th, and 6th ribs are seen. 3. Coronary  artery atherosclerosis. 4. Prominent cystic lesions in both kidney upper poles. Left kidney upper pole cystic lesion is large and somewhat irregular. This could be from polycystic kidney disease although cystic renal masses cannot be excluded and only the very top sub the kidneys were included. Correlate with patient history in determining need for further nonemergent imaging workup. 5. Thoracic spondylosis.   Electronically Signed   By: Sherryl Barters M.D.   On: 08/12/2013 20:21   Assessment/Plan: 73 y/o with PD and recurrent syncopal episodes. Previous syncope work up here at [facility] was unrevealing. 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  Recommend: 1) check for orthostatic changes while in the hospital. 2) Will probably need autonomic testing/tilt table testing as outpatient and then decide about pharmacological treatment with fludrocortisone or Midodrine. 4) Avoid polypharmacy if feasible 5) Donepezil has been reported as a cause of syncope, but will let his outpatient neurologist decide whether or not he needs to stay on it.    Dorian Pod, MD 08/13/2013, 1:15 PM

## 2013-08-13 NOTE — Evaluation (Signed)
Physical Therapy Evaluation Patient Details Name: [name] MRN: [id number] DOB: 1941-04-21 Today's Date: 08/13/2013 Time: 2440-1027 PT Time Calculation (min): 16 min  PT Assessment / Plan / Recommendation History of Present Illness   Pt is a 73 y.o. Male adm secondary to syncopal episode. Pt suffered Rt 3-6 rib fractures. Medical history includes BPPV, HTN, hyperlipidemia, basal cell carcinoma, parkinson's disease.   Clinical Impression  Pt adm due to the above. Presents with decreased independence with functional mobility and is a fall risk due to abnormality of gt. Pt to benefit from skilled PT in acute setting to address deficits with mobility and increase independence with gt. Pt to benefit from OPPT upon D/C to address vestibular involvement with syncopal episodes. Pt will have 24/7 (A) from wife upon D/C. Recommend him using RW for mobility, pt agreeable.     PT Assessment  Patient needs continued PT services    Follow Up Recommendations  Outpatient PT;Other (comment);Supervision/Assistance - 24 hour (vestibular rehab )    Does the patient have the potential to tolerate intense rehabilitation      Barriers to Discharge        Equipment Recommendations  None recommended by PT    Recommendations for Other Services     Frequency Min 3X/week    Precautions / Restrictions Precautions Precautions: Fall Precaution Comments: pt reports he has had multiple syncopal episodes  Restrictions Weight Bearing Restrictions: No   Pertinent Vitals/Pain No complaints.       Mobility  Bed Mobility Bed Mobility: Supine to Sit;Sitting - Scoot to Edge of Bed Supine to Sit: 5: Supervision;HOB elevated;With rails Sitting - Scoot to Edge of Bed: 6: Modified independent (Device/Increase time) Details for Bed Mobility Assistance: cues for hand placement; pt requires incr time  Transfers Transfers: Sit to Stand;Stand to Sit Sit to Stand: 4: Min guard;From bed Stand to Sit: 4: Min  guard;To chair/3-in-1;With armrests;With upper extremity assist Details for Transfer Assistance: min guard to steady and for safety; pt unsteady with transfers but reports this is his baseline  Ambulation/Gait Ambulation/Gait Assistance: 4: Min guard Ambulation Distance (Feet): 100 Feet Assistive device: Small based quad cane Ambulation/Gait Assistance Details: pt ambulates with festinating/parkinsonian gt; short shuffled steps and is unsteady; min guard to steady and for safety; pt would benefit from ambulating with his RW at this time to prevent falls, pt is agreeable  Gait Pattern: Decreased stride length;Festinating;Shuffle;Decreased trunk rotation;Trunk flexed;Wide base of support Gait velocity: decreased; guarded; cautious gt Stairs: No Wheelchair Mobility Wheelchair Mobility: No         PT Diagnosis: Abnormality of gait  PT Problem List: Decreased balance;Decreased mobility;Decreased knowledge of use of DME PT Treatment Interventions: DME instruction;Gait training;Stair training;Functional mobility training;Therapeutic activities;Therapeutic exercise;Balance training;Neuromuscular re-education;Patient/family education     PT Goals(Current goals can be found in the care plan section) Acute Rehab PT Goals Patient Stated Goal: to go home with wife PT Goal Formulation: With patient Time For Goal Achievement: 08/27/13 Potential to Achieve Goals: Good  Visit Information  Last PT Received On: 08/13/13 Assistance Needed: +1       Prior [place] expects to be discharged to:: Private residence Living Arrangements: Spouse/significant other Available Help at Discharge: Family Type of Home: House Home Access: Stairs to enter Technical brewer of Steps: 2 Entrance Stairs-Rails: [place]: [place]: Environmental consultant - 2 wheels;Cane - quad;Shower seat Prior Function Level of Independence: Independent with assistive  device(s) Comments: pt ambulates with SPC primarily;  reports he is independent with ADLs; wife is responsible for housework  Communication Communication: No difficulties Dominant Hand: Right    Cognition  Cognition Arousal/Alertness: Awake/alert Behavior During Therapy: WFL for tasks assessed/performed Overall Cognitive Status: Within Functional Limits for tasks assessed    Extremity/Trunk Assessment Upper Extremity Assessment Upper Extremity Assessment: Defer to OT evaluation Lower Extremity Assessment Lower Extremity Assessment: Overall WFL for tasks assessed Cervical / Trunk Assessment Cervical / Trunk Assessment: Normal   Balance Balance Balance Assessed: Yes Static Sitting Balance Static Sitting - Balance Support: Feet supported;No upper extremity supported Static Sitting - Level of Assistance: 7: Independent Static Standing Balance Static Standing - Balance Support: Right upper extremity supported;During functional activity Static Standing - Level of Assistance: Other (comment) (min guard)  End of Session PT - End of Session Equipment Utilized During Treatment: Gait belt Activity Tolerance: Patient tolerated treatment well Patient left: in chair;with call bell/phone within reach Nurse Communication: Mobility status;Precautions  GP     Gustavus Bryant, [place] 08/13/2013, 11:03 AM

## 2013-08-13 NOTE — Progress Notes (Signed)
Pt ambulated in hallway 500 ft with rolling walker and tolerated activity well. Pt assisted to bed. Call bell within reach. Will continue to monitor.

## 2013-08-13 NOTE — Progress Notes (Signed)
Pt given IS and instructed on how to use IS. Pt able to perform return demonstration on IS. Pt states understand that he must work on IS 10 X a hour. Pt currently getting 1750 on IS. Will continue to monitor and encourage.

## 2013-08-13 NOTE — ED Provider Notes (Signed)
I have personally seen and examined the patient.  I have discussed the plan of care with the resident.  I have reviewed the documentation on PMH/FH/Soc. History.  I have reviewed the documentation of the resident and agree.  Orlie Dakin, MD 08/13/13 [phone]

## 2013-08-13 NOTE — Progress Notes (Signed)
[facility] PROGRESS NOTE  [name] DGU:[id number] DOB: 1940/11/30 DOA: 08/12/2013 PCP: Sherrie Mustache, MD  Assessment/Plan: 1. Recurrent Syncopal Episode - continue telemetry monitoring.  Cardiac enzymes negative for acute injury.  Continue fall precautions, Neurology consult pending.  Check CK level (pending).  2. Multiple Rib Fractures (right 3-6) - appreciate Trauma service following, ordered incentive spirometry, pain controlled with meds. Following, Repeat CXR today.  3. Chronic Obstructive Hydrocephalus - stable per recent imaging 4. Hypertension - blood pressures controlled, stable 5. Atelectasis - Incentive spirometry, repeat CXR  6. History of ulcerative colitis 7. Hyperlipidemia - check lipid panel (fasting) in AM  Code Status: Full Family Communication: No family at bedside Disposition Plan: Not ready for discharge    HPI/Subjective: Pt reports tenderness in right rib cage, no SOB, no recurrent syncope or falls   Objective: Filed Vitals:   08/13/13 0502  BP: 159/71  Pulse: 75  Temp: 99.2 F (37.3 C)  Resp: 20   No intake or output data in the 24 hours ending 08/13/13 0800 Filed Weights   08/12/13 1618 08/12/13 2340  Weight: 158 lb (71.668 kg) 155 lb 3.3 oz (70.4 kg)    Exam:   General:  Awake, alert, no distress   Cardiovascular: normal s1, s2 sounds   Respiratory:  BBS shallow, no wheezes heard  Abdomen: soft, nondistended, no masses palpated  Musculoskeletal: no cyanosis or clubbing   Data Reviewed: Basic Metabolic Panel:  Recent Labs Lab 08/12/13 1810 08/13/13 0155  NA 139  --   K 4.1  --   CL 99  --   CO2 27  --   GLUCOSE 109*  --   BUN 13  --   CREATININE 0.65 0.65  CALCIUM 9.9  --    Liver Function Tests: No results found for this basename: AST, ALT, ALKPHOS, BILITOT, PROT, ALBUMIN,  in the last 168 hours No results found for this basename: LIPASE, AMYLASE,  in the last 168 hours No results found for this  basename: AMMONIA,  in the last 168 hours CBC:  Recent Labs Lab 08/12/13 1810 08/13/13 0155  WBC 9.1 8.9  HGB 16.4 14.5  HCT 47.2 42.9  MCV 93.3 92.5  PLT 209 186   Cardiac Enzymes: No results found for this basename: CKTOTAL, CKMB, CKMBINDEX, TROPONINI,  in the last 168 hours BNP (last 3 results)  Recent Labs  08/12/13 1810  PROBNP 83.7   CBG: No results found for this basename: GLUCAP,  in the last 168 hours  No results found for this or any previous visit (from the past 240 hour(s)).   Studies: Dg Chest 2 View  08/12/2013   CLINICAL DATA:  Loss of consciousness.  Chest pain.  EXAM: CHEST  2 VIEW  COMPARISON:  02/22/2008  FINDINGS: Heart size is normal. The aorta is unremarkable. The lungs are clear. The vascularity is normal. No effusions. Chronic degenerative changes affect the spine.  IMPRESSION: No active cardiopulmonary disease. No cause of chest pain identified.   Electronically Signed   By: Nelson Chimes M.D.   On: 08/12/2013 17:09   Ct Head Wo Contrast  08/12/2013   CLINICAL DATA:  Loss of consciousness.  EXAM: CT HEAD WITHOUT CONTRAST  TECHNIQUE: Contiguous axial images were obtained from the base of the skull through the vertex without intravenous contrast.  COMPARISON:  02/25/2013.  FINDINGS: Stable noncommunicating hydrocephalus with marked dilatation of the lateral and 3rd ventricles. There is also a stable arachnoid cyst in the middle cranial  fossa on the right. No extra-axial fluid collections are identified. No acute intracranial findings or mass lesions. The brainstem and cerebellum are grossly normal and stable.  The bony structures are intact. The paranasal sinuses and mastoid air cells are clear.  IMPRESSION: Chronic noncommunicating hydrocephalus.  No acute intracranial findings.   Electronically Signed   By: Kalman Jewels M.D.   On: 08/12/2013 20:10   Ct Angio Chest Pe W/cm &/or Wo Cm  08/12/2013   CLINICAL DATA:  Chest trauma.  Fall.  EXAM: CT ANGIOGRAPHY  CHEST WITH CONTRAST  TECHNIQUE: Multidetector CT imaging of the chest was performed using the standard protocol during bolus administration of intravenous contrast. Multiplanar CT image reconstructions including MIPs were obtained to evaluate the vascular anatomy.  CONTRAST:  152mL OMNIPAQUE IOHEXOL 350 MG/ML SOLN  COMPARISON:  None.  FINDINGS: Clinical data specifically requested concern for pulmonary embolus rather than dissection, and accordingly today's exam was performed with pulmonary arterial contrast out demise a shin rather than systemic arterial assessment. Today's exam does not rule out aortic dissection.  No filling defect is identified in the pulmonary arterial tree to suggest pulmonary embolus. No pericardial effusion or significant pleural effusion. Large irregular cystic lesions at the tops of the kidneys noted, possibly from polycystic kidney disease although malignancy is not excluded, and the 9.8 cm left kidney upper pole cyst has somewhat irregular margins and possible internal septations.  Hypodense lesion in the lateral segment left hepatic lobe, image 73 of series 4, 1.5 x 1.2 cm.  Coronary artery atherosclerotic calcification is suspected.  Thoracic spondylosis noted.  No definite sternal fracture.  Nondisplaced fractures of the right 3rd, 4th, 5th, and 6th ribs. There is dependent subsegmental atelectasis in both lower lobes.  Review of the MIP images confirms the above findings.  IMPRESSION: 1. No embolus identified. Today's exam is not sensitive for aortic dissection due to Pulmonary rather than systemic arterial enhancement. 2. Nondisplaced fractures of the right 3rd, 4th, 5th, and 6th ribs are seen. 3. Coronary artery atherosclerosis. 4. Prominent cystic lesions in both kidney upper poles. Left kidney upper pole cystic lesion is large and somewhat irregular. This could be from polycystic kidney disease although cystic renal masses cannot be excluded and only the very top sub the kidneys  were included. Correlate with patient history in determining need for further nonemergent imaging workup. 5. Thoracic spondylosis.   Electronically Signed   By: Sherryl Barters M.D.   On: 08/12/2013 20:21    Scheduled Meds: . aspirin EC  325 mg Oral Daily  . carbidopa-levodopa  1 tablet Oral TID  . cyclobenzaprine  5 mg Oral QHS  . docusate sodium  100 mg Oral BID  . donepezil  5 mg Oral Daily  . enoxaparin (LOVENOX) injection  40 mg Subcutaneous Q24H  . irbesartan  75 mg Oral Daily  . Mesalamine  400 mg Oral Daily  . sodium chloride  3 mL Intravenous Q12H   Continuous Infusions:   Principal Problem:   Syncope and collapse Active Problems:   Rib fracture   Syncope   [name] [facility]  [facility] Pager [phone]. If 7PM-7AM, please contact night-coverage at [email or web address], password [email or web address] 08/13/2013, 8:00 AM  LOS: 1 day

## 2013-08-13 NOTE — Progress Notes (Addendum)
Patient ID: [name], male   DOB: July 26, 1941, 73 y.o.   MRN: [id number]    Subjective: Pt feels well today.  Wanting to mobilize.  C/o chest soreness, but no major pain.  C/o no IS yet to work on breathing with.  Objective: Vital signs in last 24 hours: Temp:  [97.5 F (36.4 C)-99.2 F (37.3 C)] 99.2 F (37.3 C) (01/03 0502) Pulse Rate:  [64-78] 75 (01/03 0502) Resp:  [12-21] 20 (01/03 0502) BP: (118-183)/(70-96) 159/71 mmHg (01/03 0502) SpO2:  [96 %-100 %] 96 % (01/03 0502) Weight:  [155 lb 3.3 oz (70.4 kg)-158 lb (71.668 kg)] 155 lb 3.3 oz (70.4 kg) (01/02 2340) Last BM Date: 08/13/13  Intake/Output from previous day: 01/02 0701 - 01/03 0700 In: 360 [P.O.:360] Out: -  Intake/Output this shift:    PE: Gen: NAD, sitting up in chair Heart: regular Lungs: CTAB, good air movement  Lab Results:   Recent Labs  08/12/13 1810 08/13/13 0155  WBC 9.1 8.9  HGB 16.4 14.5  HCT 47.2 42.9  PLT 209 186   BMET  Recent Labs  08/12/13 1810 08/13/13 0155  NA 139  --   K 4.1  --   CL 99  --   CO2 27  --   GLUCOSE 109*  --   BUN 13  --   CREATININE 0.65 0.65  CALCIUM 9.9  --    PT/INR No results found for this basename: LABPROT, INR,  in the last 72 hours CMP     Component Value Date/Time   NA 139 08/12/2013 1810   K 4.1 08/12/2013 1810   CL 99 08/12/2013 1810   CO2 27 08/12/2013 1810   GLUCOSE 109* 08/12/2013 1810   BUN 13 08/12/2013 1810   CREATININE 0.65 08/13/2013 0155   CALCIUM 9.9 08/12/2013 1810   GFRNONAA >90 08/13/2013 0155   GFRAA >90 08/13/2013 0155   Lipase  No results found for this basename: lipase       Studies/Results: Dg Chest 2 View  08/12/2013   CLINICAL DATA:  Loss of consciousness.  Chest pain.  EXAM: CHEST  2 VIEW  COMPARISON:  02/22/2008  FINDINGS: Heart size is normal. The aorta is unremarkable. The lungs are clear. The vascularity is normal. No effusions. Chronic degenerative changes affect the spine.  IMPRESSION: No active cardiopulmonary  disease. No cause of chest pain identified.   Electronically Signed   By: Nelson Chimes M.D.   On: 08/12/2013 17:09   Ct Head Wo Contrast  08/12/2013   CLINICAL DATA:  Loss of consciousness.  EXAM: CT HEAD WITHOUT CONTRAST  TECHNIQUE: Contiguous axial images were obtained from the base of the skull through the vertex without intravenous contrast.  COMPARISON:  02/25/2013.  FINDINGS: Stable noncommunicating hydrocephalus with marked dilatation of the lateral and 3rd ventricles. 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 No extra-axial fluid collections are identified. No acute intracranial findings or mass lesions. The brainstem and cerebellum are grossly normal and stable.  The bony structures are intact. The paranasal sinuses and mastoid air cells are clear.  IMPRESSION: Chronic noncommunicating hydrocephalus.  No acute intracranial findings.   Electronically Signed   By: Kalman Jewels M.D.   On: 08/12/2013 20:10   Ct Angio Chest Pe W/cm &/or Wo Cm  08/12/2013   CLINICAL DATA:  Chest trauma.  Fall.  EXAM: CT ANGIOGRAPHY CHEST WITH CONTRAST  TECHNIQUE: Multidetector CT imaging of the chest was  performed using the standard protocol during bolus administration of intravenous contrast. Multiplanar CT image reconstructions including MIPs were obtained to evaluate the vascular anatomy.  CONTRAST:  132mL OMNIPAQUE IOHEXOL 350 MG/ML SOLN  COMPARISON:  None.  FINDINGS: Clinical data specifically requested concern for pulmonary embolus rather than dissection, and accordingly today's exam was performed with pulmonary arterial contrast out demise a shin rather than systemic arterial assessment. Today's exam does not rule out aortic dissection.  No filling defect is identified in the pulmonary arterial tree to suggest pulmonary embolus. No pericardial effusion or significant pleural effusion. Large irregular cystic lesions at the tops of the kidneys noted, possibly from polycystic  kidney disease although malignancy is not excluded, and the 9.8 cm left kidney upper pole cyst has somewhat irregular margins and possible internal septations.  Hypodense lesion in the lateral segment left hepatic lobe, image 73 of series 4, 1.5 x 1.2 cm.  Coronary artery atherosclerotic calcification is suspected.  Thoracic spondylosis noted.  No definite sternal fracture.  Nondisplaced fractures of the right 3rd, 4th, 5th, and 6th ribs. There is dependent subsegmental atelectasis in both lower lobes.  Review of the MIP images confirms the above findings.  IMPRESSION: 1. No embolus identified. Today's exam is not sensitive for aortic dissection due to Pulmonary rather than systemic arterial enhancement. 2. Nondisplaced fractures of the right 3rd, 4th, 5th, and 6th ribs are seen. 3. Coronary artery atherosclerosis. 4. Prominent cystic lesions in both kidney upper poles. Left kidney upper pole cystic lesion is large and somewhat irregular. This could be from polycystic kidney disease although cystic renal masses cannot be excluded and only the very top sub the kidneys were included. Correlate with patient history in determining need for further nonemergent imaging workup. 5. Thoracic spondylosis.   Electronically Signed   By: Sherryl Barters M.D.   On: 08/12/2013 20:21    Anti-infectives: Anti-infectives   None       Assessment/Plan  1. Fall 2. Syncopal episodes 3.  Right rib fx 3-6  Plan: 1. I have asked to staff to get him an IS so he can work on this. 2. He has not required much pain meds at all.  He has oral meds written for prn 3. CXR pending for today.  Doubt any PTX or worsening appearance as he appears stable and has a good exam. 4. Will follow.  LOS: 1 day    Unity Luepke E 08/13/2013, 11:06 AM Pager: [phone]

## 2013-08-14 DIAGNOSIS — R413 Other amnesia: Secondary | ICD-10-CM

## 2013-08-14 LAB — COMPREHENSIVE METABOLIC PANEL
ALT: <5 U/L (ref 0–53)
AST: 16 U/L (ref 0–37)
Albumin: 3.2 g/dL — ABNORMAL LOW (ref 3.5–5.2)
Alkaline Phosphatase: 81 U/L (ref 39–117)
BUN: 12 mg/dL (ref 6–23)
CO2: 24 mEq/L (ref 19–32)
Calcium: 8.9 mg/dL (ref 8.4–10.5)
Chloride: 95 mEq/L — ABNORMAL LOW (ref 96–112)
Creatinine, Ser: 0.63 mg/dL (ref 0.50–1.35)
GFR calc Af Amer: >90 mL/min (ref >90)
GFR calc non Af Amer: >90 mL/min (ref >90)
Glucose, Bld: 104 mg/dL — ABNORMAL HIGH (ref 70–99)
Potassium: 3.9 mEq/L (ref 3.7–5.3)
Sodium: 131 mEq/L — ABNORMAL LOW (ref 137–147)
Total Bilirubin: 0.5 mg/dL (ref 0.3–1.2)
Total Protein: 6.6 g/dL (ref 6.0–8.3)

## 2013-08-14 LAB — LIPID PANEL
Cholesterol: 205 mg/dL — ABNORMAL HIGH (ref 0–200)
HDL: 61 mg/dL (ref >39)
LDL Cholesterol: 125 mg/dL — ABNORMAL HIGH (ref 0–99)
Total CHOL/HDL Ratio: 3.4 RATIO
Triglycerides: 93 mg/dL (ref <150)
VLDL: 19 mg/dL (ref 0–40)

## 2013-08-14 LAB — CBC
HCT: 40.5 % (ref 39.0–52.0)
Hemoglobin: 14.3 g/dL (ref 13.0–17.0)
MCH: 31.8 pg (ref 26.0–34.0)
MCHC: 35.3 g/dL (ref 30.0–36.0)
MCV: 90 fL (ref 78.0–100.0)
Platelets: 201 10*3/uL (ref 150–400)
RBC: 4.5 MIL/uL (ref 4.22–5.81)
RDW: 12.6 % (ref 11.5–15.5)
WBC: 7 10*3/uL (ref 4.0–10.5)

## 2013-08-14 LAB — TSH: TSH: 0.618 u[IU]/mL (ref 0.350–4.500)

## 2013-08-14 NOTE — Progress Notes (Signed)
Trauma surgery attending note:  I have interviewed and assess this patient this morning. He is still symptomatic from his rib fractures but his vital capacity is over 2 L and he has a nonproductive cough And normal oxygen saturations on room air.. There is no evidence of superimposed pneumonia. He should be fine to go home once neurologic workup is completed.  Will sign off. Please reconsult as necessary.   Edsel Petrin. Dalbert Batman, M.D., [facility] General and Minimally invasive Surgery Breast and Colorectal Surgery Trauma Office:   [phone]

## 2013-08-14 NOTE — Progress Notes (Signed)
Patient ID: [name], male   DOB: 08-09-1941, 73 y.o.   MRN: [id number]    Subjective: Pt doing well.  Pulling his full IS with minimal soreness.  Some soreness at night and took a pain pill then.  Objective: Vital signs in last 24 hours: Temp:  [98.4 F (36.9 C)-98.8 F (37.1 C)] 98.6 F (37 C) (01/04 0433) Pulse Rate:  [65-72] 71 (01/04 0433) Resp:  [18-20] 18 (01/04 0433) BP: (126-138)/(76-82) 126/82 mmHg (01/04 0433) SpO2:  [94 %-100 %] 94 % (01/04 0433) Last BM Date: 08/13/13  Intake/Output from previous day: 01/03 0701 - 01/04 0700 In: 600 [P.O.:600] Out: 700 [Urine:700] Intake/Output this shift:    PE: Chest: some soreness, but CTAB with good movement and normal effort.    Lab Results:   Recent Labs  08/13/13 0155 08/14/13 0425  WBC 8.9 7.0  HGB 14.5 14.3  HCT 42.9 40.5  PLT 186 201   BMET  Recent Labs  08/12/13 1810 08/13/13 0155 08/14/13 0425  NA 139  --  131*  K 4.1  --  3.9  CL 99  --  95*  CO2 27  --  24  GLUCOSE 109*  --  104*  BUN 13  --  12  CREATININE 0.65 0.65 0.63  CALCIUM 9.9  --  8.9   PT/INR No results found for this basename: LABPROT, INR,  in the last 72 hours CMP     Component Value Date/Time   NA 131* 08/14/2013 0425   K 3.9 08/14/2013 0425   CL 95* 08/14/2013 0425   CO2 24 08/14/2013 0425   GLUCOSE 104* 08/14/2013 0425   BUN 12 08/14/2013 0425   CREATININE 0.63 08/14/2013 0425   CALCIUM 8.9 08/14/2013 0425   PROT 6.6 08/14/2013 0425   ALBUMIN 3.2* 08/14/2013 0425   AST 16 08/14/2013 0425   ALT <5 08/14/2013 0425   ALKPHOS 81 08/14/2013 0425   BILITOT 0.5 08/14/2013 0425   GFRNONAA >90 08/14/2013 0425   GFRAA >90 08/14/2013 0425   Lipase  No results found for this basename: lipase       Studies/Results: Dg Chest 2 View  08/13/2013   CLINICAL DATA:  Fall, multiple rib fractures  EXAM: CHEST  2 VIEW  COMPARISON:  CT chest 08/12/2013  FINDINGS: The lungs are clear. No pneumothorax or pleural effusion. Nondisplaced rib fractures better  seen on recent CT scan. Cardiac and mediastinal contours are within normal limits. Nonspecific visualized bowel gas pattern. Multilevel degenerative change throughout the spine.  IMPRESSION: No active cardiopulmonary disease. Specifically, no pneumothorax or pleural effusion.  Nondisplaced right-sided rib fractures better seen on yesterday's CT scan.   Electronically Signed   By: Jacqulynn Cadet M.D.   On: 08/13/2013 12:21   Dg Chest 2 View  08/12/2013   CLINICAL DATA:  Loss of consciousness.  Chest pain.  EXAM: CHEST  2 VIEW  COMPARISON:  02/22/2008  FINDINGS: Heart size is normal. The aorta is unremarkable. The lungs are clear. The vascularity is normal. No effusions. Chronic degenerative changes affect the spine.  IMPRESSION: No active cardiopulmonary disease. No cause of chest pain identified.   Electronically Signed   By: Nelson Chimes M.D.   On: 08/12/2013 17:09   Ct Head Wo Contrast  08/12/2013   CLINICAL DATA:  Loss of consciousness.  EXAM: CT HEAD WITHOUT CONTRAST  TECHNIQUE: Contiguous axial images were obtained from the base of the skull through the vertex without intravenous contrast.  COMPARISON:  02/25/2013.  FINDINGS: Stable noncommunicating hydrocephalus with marked dilatation of the lateral and 3rd ventricles. 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 No extra-axial fluid collections are identified. No acute intracranial findings or mass lesions. The brainstem and cerebellum are grossly normal and stable.  The bony structures are intact. The paranasal sinuses and mastoid air cells are clear.  IMPRESSION: Chronic noncommunicating hydrocephalus.  No acute intracranial findings.   Electronically Signed   By: Kalman Jewels M.D.   On: 08/12/2013 20:10   Ct Angio Chest Pe W/cm &/or Wo Cm  08/12/2013   CLINICAL DATA:  Chest trauma.  Fall.  EXAM: CT ANGIOGRAPHY CHEST WITH CONTRAST  TECHNIQUE: Multidetector CT imaging of the chest was performed using the  standard protocol during bolus administration of intravenous contrast. Multiplanar CT image reconstructions including MIPs were obtained to evaluate the vascular anatomy.  CONTRAST:  13mL OMNIPAQUE IOHEXOL 350 MG/ML SOLN  COMPARISON:  None.  FINDINGS: Clinical data specifically requested concern for pulmonary embolus rather than dissection, and accordingly today's exam was performed with pulmonary arterial contrast out demise a shin rather than systemic arterial assessment. Today's exam does not rule out aortic dissection.  No filling defect is identified in the pulmonary arterial tree to suggest pulmonary embolus. No pericardial effusion or significant pleural effusion. Large irregular cystic lesions at the tops of the kidneys noted, possibly from polycystic kidney disease although malignancy is not excluded, and the 9.8 cm left kidney upper pole cyst has somewhat irregular margins and possible internal septations.  Hypodense lesion in the lateral segment left hepatic lobe, image 73 of series 4, 1.5 x 1.2 cm.  Coronary artery atherosclerotic calcification is suspected.  Thoracic spondylosis noted.  No definite sternal fracture.  Nondisplaced fractures of the right 3rd, 4th, 5th, and 6th ribs. There is dependent subsegmental atelectasis in both lower lobes.  Review of the MIP images confirms the above findings.  IMPRESSION: 1. No embolus identified. Today's exam is not sensitive for aortic dissection due to Pulmonary rather than systemic arterial enhancement. 2. Nondisplaced fractures of the right 3rd, 4th, 5th, and 6th ribs are seen. 3. Coronary artery atherosclerosis. 4. Prominent cystic lesions in both kidney upper poles. Left kidney upper pole cystic lesion is large and somewhat irregular. This could be from polycystic kidney disease although cystic renal masses cannot be excluded and only the very top sub the kidneys were included. Correlate with patient history in determining need for further nonemergent  imaging workup. 5. Thoracic spondylosis.   Electronically Signed   By: Sherryl Barters M.D.   On: 08/12/2013 20:21    Anti-infectives: Anti-infectives   None       Assessment/Plan  1. Syncopal episodes 2. Right rib fx 3-6  Plan: 1. Patient doing well with good pain control from his rib fx.  He is stable from a trauma standpoint.  He can be dc home with follow up with his PCP from our standpoint.  We will sign off. 2. Cont aggressive pulm toilet and pain meds for pain control prn   LOS: 2 days    Aldina Porta E 08/14/2013, 9:19 AM Pager: [phone]

## 2013-08-14 NOTE — Progress Notes (Signed)
[facility] PROGRESS NOTE  [name] YKD:[id number] DOB: 07-26-1941 DOA: 08/12/2013 PCP: Sherrie Mustache, MD  Assessment/Plan: 1. Recurrent Syncopal Episode - continuous telemetry monitoring. Cardiac enzymes negative for acute injury. Continue fall precautions, Neurology consult appreciated. dispo pending neuro visit today  2. Multiple Rib Fractures (right 3-6) - appreciate Trauma service following, ordered incentive spirometry, pain controlled with meds. 3. Chronic Obstructive Hydrocephalus - stable per recent imaging 4. Hypertension - blood pressures controlled, stable 5. Atelectasis - Incentive spirometry, repeated CXR OK 6. History of ulcerative colitis 7. Hyperlipidemia - recommended pt address with PCP  Code Status: Full  Family Communication: long discussion with wife on phone, she wants pt to see neuro again today and wants pt to have more tests done, explained that neuro will eval and order testing as needed. She verbalized understanding.   Disposition Plan: pending neuro visit  HPI/Subjective: Pt is using incentive spirometry well, still having pain but controlled.   Objective: Filed Vitals:   08/14/13 1006  BP: 113/69  Pulse:   Temp:   Resp:     Intake/Output Summary (Last 24 hours) at 08/14/13 1206 Last data filed at 08/14/13 0436  Gross per 24 hour  Intake    600 ml  Output    700 ml  Net   -100 ml   Filed Weights   08/12/13 1618 08/12/13 2340  Weight: 158 lb (71.668 kg) 155 lb 3.3 oz (70.4 kg)    Exam:   General:  Awake, alert, no distress  Cardiovascular: normal s1, s2 sounds, no m/r/g  Respiratory: BBS shallow  Abdomen: soft, nondistended, nontender  Musculoskeletal: no Cyanosis   Data Reviewed: Basic Metabolic Panel:  Recent Labs Lab 08/12/13 1810 08/13/13 0155 08/14/13 0425  NA 139  --  131*  K 4.1  --  3.9  CL 99  --  95*  CO2 27  --  24  GLUCOSE 109*  --  104*  BUN 13  --  12  CREATININE 0.65 0.65 0.63   CALCIUM 9.9  --  8.9   Liver Function Tests:  Recent Labs Lab 08/14/13 0425  AST 16  ALT <5  ALKPHOS 81  BILITOT 0.5  PROT 6.6  ALBUMIN 3.2*   No results found for this basename: LIPASE, AMYLASE,  in the last 168 hours No results found for this basename: AMMONIA,  in the last 168 hours CBC:  Recent Labs Lab 08/12/13 1810 08/13/13 0155 08/14/13 0425  WBC 9.1 8.9 7.0  HGB 16.4 14.5 14.3  HCT 47.2 42.9 40.5  MCV 93.3 92.5 90.0  PLT 209 186 201   Cardiac Enzymes:  Recent Labs Lab 08/13/13 0155  CKTOTAL 198   BNP (last 3 results)  Recent Labs  08/12/13 1810  PROBNP 83.7   CBG: No results found for this basename: GLUCAP,  in the last 168 hours  No results found for this or any previous visit (from the past 240 hour(s)).   Studies: Dg Chest 2 View  08/13/2013   CLINICAL DATA:  Fall, multiple rib fractures  EXAM: CHEST  2 VIEW  COMPARISON:  CT chest 08/12/2013  FINDINGS: The lungs are clear. No pneumothorax or pleural effusion. Nondisplaced rib fractures better seen on recent CT scan. Cardiac and mediastinal contours are within normal limits. Nonspecific visualized bowel gas pattern. Multilevel degenerative change throughout the spine.  IMPRESSION: No active cardiopulmonary disease. Specifically, no pneumothorax or pleural effusion.  Nondisplaced right-sided rib fractures better seen on yesterday's CT scan.   Electronically  Signed   By: Jacqulynn Cadet M.D.   On: 08/13/2013 12:21   Dg Chest 2 View  08/12/2013   CLINICAL DATA:  Loss of consciousness.  Chest pain.  EXAM: CHEST  2 VIEW  COMPARISON:  02/22/2008  FINDINGS: Heart size is normal. The aorta is unremarkable. The lungs are clear. The vascularity is normal. No effusions. Chronic degenerative changes affect the spine.  IMPRESSION: No active cardiopulmonary disease. No cause of chest pain identified.   Electronically Signed   By: Nelson Chimes M.D.   On: 08/12/2013 17:09   Ct Head Wo Contrast  08/12/2013   CLINICAL  DATA:  Loss of consciousness.  EXAM: CT HEAD WITHOUT CONTRAST  TECHNIQUE: Contiguous axial images were obtained from the base of the skull through the vertex without intravenous contrast.  COMPARISON:  02/25/2013.  FINDINGS: Stable noncommunicating hydrocephalus with marked dilatation of the lateral and 3rd ventricles. 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 No extra-axial fluid collections are identified. No acute intracranial findings or mass lesions. The brainstem and cerebellum are grossly normal and stable.  The bony structures are intact. The paranasal sinuses and mastoid air cells are clear.  IMPRESSION: Chronic noncommunicating hydrocephalus.  No acute intracranial findings.   Electronically Signed   By: Kalman Jewels M.D.   On: 08/12/2013 20:10   Ct Angio Chest Pe W/cm &/or Wo Cm  08/12/2013   CLINICAL DATA:  Chest trauma.  Fall.  EXAM: CT ANGIOGRAPHY CHEST WITH CONTRAST  TECHNIQUE: Multidetector CT imaging of the chest was performed using the standard protocol during bolus administration of intravenous contrast. Multiplanar CT image reconstructions including MIPs were obtained to evaluate the vascular anatomy.  CONTRAST:  170mL OMNIPAQUE IOHEXOL 350 MG/ML SOLN  COMPARISON:  None.  FINDINGS: Clinical data specifically requested concern for pulmonary embolus rather than dissection, and accordingly today's exam was performed with pulmonary arterial contrast out demise a shin rather than systemic arterial assessment. Today's exam does not rule out aortic dissection.  No filling defect is identified in the pulmonary arterial tree to suggest pulmonary embolus. No pericardial effusion or significant pleural effusion. Large irregular cystic lesions at the tops of the kidneys noted, possibly from polycystic kidney disease although malignancy is not excluded, and the 9.8 cm left kidney upper pole cyst has somewhat irregular margins and possible internal septations.   Hypodense lesion in the lateral segment left hepatic lobe, image 73 of series 4, 1.5 x 1.2 cm.  Coronary artery atherosclerotic calcification is suspected.  Thoracic spondylosis noted.  No definite sternal fracture.  Nondisplaced fractures of the right 3rd, 4th, 5th, and 6th ribs. There is dependent subsegmental atelectasis in both lower lobes.  Review of the MIP images confirms the above findings.  IMPRESSION: 1. No embolus identified. Today's exam is not sensitive for aortic dissection due to Pulmonary rather than systemic arterial enhancement. 2. Nondisplaced fractures of the right 3rd, 4th, 5th, and 6th ribs are seen. 3. Coronary artery atherosclerosis. 4. Prominent cystic lesions in both kidney upper poles. Left kidney upper pole cystic lesion is large and somewhat irregular. This could be from polycystic kidney disease although cystic renal masses cannot be excluded and only the very top sub the kidneys were included. Correlate with patient history in determining need for further nonemergent imaging workup. 5. Thoracic spondylosis.   Electronically Signed   By: Sherryl Barters M.D.   On: 08/12/2013 20:21    Scheduled Meds: . aspirin EC  325  mg Oral Daily  . carbidopa-levodopa  1 tablet Oral TID  . cyclobenzaprine  5 mg Oral QHS  . docusate sodium  100 mg Oral BID  . donepezil  5 mg Oral Daily  . enoxaparin (LOVENOX) injection  40 mg Subcutaneous Q24H  . irbesartan  75 mg Oral Daily  . Mesalamine  400 mg Oral Daily  . sodium chloride  3 mL Intravenous Q12H   Continuous Infusions:   Principal Problem:   Syncope and collapse Active Problems:   Rib fracture   Syncope   [name] [facility]  [facility] Pager [phone]. If 7PM-7AM, please contact night-coverage at [email or web address], password [facility] 08/14/2013, 12:06 PM  LOS: 2 days

## 2013-08-15 DIAGNOSIS — G20 Parkinson's disease: Secondary | ICD-10-CM | POA: Diagnosis present

## 2013-08-15 MED ORDER — TRAMADOL HCL 50 MG PO TABS: 50.0000 mg | ORAL_TABLET | Freq: Four times a day (QID) | ORAL | Status: DC | PRN

## 2013-08-15 MED ORDER — ASPIRIN 325 MG PO TBEC: 325.0000 mg | DELAYED_RELEASE_TABLET | Freq: Every day | ORAL | Status: DC

## 2013-08-15 NOTE — Progress Notes (Signed)
D/c instructions reviewed with pt and his sister as he requested. Copy of instructions and scripts given to pt. Pt d/c'd via wheelchair with belongings with family, escorted by unit NT.

## 2013-08-15 NOTE — Evaluation (Signed)
Occupational Therapy Evaluation Patient Details Name: [name] MRN: [id number] DOB: 11-Oct-1940 Today's Date: 08/15/2013 Time: 7782-4235 OT Time Calculation (min): 11 min  OT Assessment / Plan / Recommendation History of present illness [name]  is a 73 y.o. male, with past medical history significant for Parkinson disease presenting to today after a fall from a sitting position and syncope. The patient stayed out for around one hour and his wife found him on the floor and was transferred here. No preceding chest pains headache or dizziness. Patient was admitted on July of last year for another syncopal episode with negative echo and carotid Dopplers. Patient complains of chest tenderness after the fall and was found to have right-sided rib fractures and a right facial abrasions and was seen by trauma surgery who will follow him during his stay.    Clinical Impression   Pt doing well and is at sup/min guard A level with ADLs and ADL mobility for safety. No further acute OT indicated and all education completed. OT will sign off    OT Assessment  Patient does not need any further OT services    Follow Up Recommendations  No OT follow up    Barriers to Discharge  none    Equipment Recommendations  None recommended by OT    Recommendations for Other Services    Frequency       Precautions / Restrictions Precautions Precautions: Fall Precaution Comments: pt reports he has had multiple syncopal episodes  Restrictions Weight Bearing Restrictions: No   Pertinent Vitals/Pain 5/10 rib area    ADL  Grooming: Performed;Wash/dry hands;Wash/dry face;Supervision/safety;Modified independent Where Assessed - Grooming: Unsupported standing Upper Body Bathing: Supervision/safety;Modified independent;Simulated Where Assessed - Upper Body Bathing: Unsupported sitting;Supported sit to stand Lower Body Bathing: Simulated;Supervision/safety;Modified independent Where Assessed - Lower Body  Bathing: Unsupported sitting;Supported standing Upper Body Dressing: Performed;Supervision/safety;Min guard Lower Body Dressing: Performed;Supervision/safety;Min guard Toilet Transfer: Performed;Supervision/safety;Min guard Toilet Transfer Method: Sit to Loss adjuster, chartered: Regular height toilet;Grab bars Toileting - Clothing Manipulation and Hygiene: Performed;Min guard;Supervision/safety Where Assessed - Best boy and Hygiene: Standing Tub/Shower Transfer: Supervision/safety;Min guard Tub/Shower Transfer Method: Therapist, art: Grab bars;Walk in shower Equipment Used: Cane Transfers/Ambulation Related to ADLs: supervision for safety only, pt used safe technique. ADL Comments: sup/min guard A for safety    OT Diagnosis:    OT Problem List:   OT Treatment Interventions:     OT Goals(Current goals can be found in the care plan section) Acute Rehab OT Goals Patient Stated Goal: to go home with wife  Visit Information  Last OT Received On: 08/15/13 Assistance Needed: +1 History of Present Illness: [name]  is a 73 y.o. male, with past medical history significant for Parkinson disease presenting to today after a fall from a sitting position and syncope. The patient stayed out for around one hour and his wife found him on the floor and was transferred here. No preceding chest pains headache or dizziness. Patient was admitted on July of last year for another syncopal episode with negative echo and carotid Dopplers. Patient complains of chest tenderness after the fall and was found to have right-sided rib fractures and a right facial abrasions and was seen by trauma surgery who will follow him during his stay.        Prior [place] expects to be discharged to:: Private residence Living Arrangements: Spouse/significant other Available Help at Discharge: Family Type of Home: [facility]  Access: Stairs to enter [employer] of Steps: 2 Entrance Stairs-Rails: Right;Left Home Layout: One level Home Equipment: Walker - 2 wheels;Cane - quad;Shower seat Prior Function Level of Independence: Independent with assistive device(s) Comments: pt ambulates with SPC primarily; reports he is independent with ADLs; wife is responsible for housework  Communication Communication: No difficulties Dominant Hand: Right         Vision/Perception Vision - History Baseline Vision: Wears glasses all the time Patient Visual Report: No change from baseline Perception Perception: Within Functional Limits   Cognition  Cognition Arousal/Alertness: Awake/alert Behavior During Therapy: WFL for tasks assessed/performed Overall Cognitive Status: Within Functional Limits for tasks assessed    Extremity/Trunk Assessment Upper Extremity Assessment Upper Extremity Assessment: Overall WFL for tasks assessed Lower Extremity Assessment Lower Extremity Assessment: Defer to PT evaluation Cervical / Trunk Assessment Cervical / Trunk Assessment: Normal     Mobility Bed Mobility Bed Mobility: Supine to Sit;Sitting - Scoot to Edge of Bed Supine to Sit: 6: Modified independent (Device/Increase time) Sitting - Scoot to Edge of Bed: 6: Modified independent (Device/Increase time) Details for Bed Mobility Assistance: cues for hand placement; pt requires incr time  Transfers Transfers: Sit to Stand;Stand to Sit Sit to Stand: 5: Supervision;With upper extremity assist;From bed;From toilet Stand to Sit: 4: Min guard;With upper extremity assist;To bed;To chair/3-in-1;To toilet Details for Transfer Assistance: supervision for safety only, pt used safe technique.     Exercise     Balance Balance Balance Assessed: Yes Static Sitting Balance Static Sitting - Balance Support: Feet supported;No upper extremity supported Static Sitting - Level of Assistance: 7: Independent Dynamic Sitting  Balance Dynamic Sitting - Balance Support: Feet supported;During functional activity;No upper extremity supported Dynamic Sitting - Level of Assistance: 7: Independent Static Standing Balance Static Standing - Balance Support: Right upper extremity supported;During functional activity Static Standing - Level of Assistance:  (min guard A) Dynamic Standing Balance Dynamic Standing - Balance Support: Left upper extremity supported;Right upper extremity supported;During functional activity Dynamic Standing - Level of Assistance: Other (comment) (min guard A)   End of Session OT - End of Session Equipment Utilized During Treatment: Other (comment) (cane) Activity Tolerance: Patient tolerated treatment well Patient left: in chair;with call bell/phone within reach  GO     Britt Bottom 08/15/2013, 12:36 PM

## 2013-08-15 NOTE — Discharge Summary (Signed)
Physician Discharge Summary  [name] YNW:[id number] DOB: October 15, 1940 DOA: 08/12/2013  PCP: Sherrie Mustache, MD  Admit date: 08/12/2013 Discharge date: 08/15/2013  Recommendations for Outpatient Follow-up:  See neurologist to discuss Parkinsonism and medications  Fall Precautions at home Repeat Chest xray to verify that rib fractures have healed  Discharge Diagnoses:  Principal Problem:   Syncope and collapse Active Problems:   Rib fracture   Syncope  Discharge Condition: stable    Diet recommendation: heart healthy  Filed Weights   08/12/13 1618 08/12/13 2340  Weight: 158 lb (71.668 kg) 155 lb 3.3 oz (70.4 kg)    History of present illness:  [name] is a 73 y.o. male, with past medical history significant for Parkinson disease presenting to today after a fall from a sitting position and syncope. The patient stayed out for around one hour and his wife found him on the floor and was transferred here. No preceding chest pains headache or dizziness. Patient was admitted on July of last year for another syncopal episode with negative echo and carotid Dopplers. Patient complains of chest tenderness after the fall and was found to have right-sided rib fractures and a right facial abrasions and was seen by trauma surgery who will follow him during his stay.   Hospital Course:  1. Recurrent Syncopal Episodes - continuous telemetry monitoring reviewed, no sig findings. Cardiac enzymes negative for acute injury. Continue fall precautions, Neurology consult appreciated. Possibly related to medications, (aricept), Pt will need further outpatient neurology workup.  Pt wants to go to [facility].  He is refusing to return to see [name] at [facility].    2. Multiple Rib Fractures (right 3-6) - appreciate Trauma service following, ordered incentive spirometry, pain controlled with meds. They have said that patient was stable to go home.  3. Chronic Obstructive Hydrocephalus  - stable per recent imaging, making patient a patient with [facility].   4. Hypertension - blood pressures controlled, stable 5. Atelectasis - Incentive spirometry, repeated CXR OK 6. History of ulcerative colitis - stable  7. Hyperlipidemia - recommended pt address with PCP outpatient   Code Status: Full  Family Communication: long discussion with wife on phone, she wants pt to see neuro again today and wants pt to have more tests done, explained that neuro will eval and order testing as needed. She verbalized understanding.   Consultations:  Neurology consult   Discharge Exam:  Pt reports that he feels much better,  No further syncopal episodes.  Filed Vitals:   08/15/13 0521  BP: 140/80  Pulse: 67  Temp: 98.2 F (36.8 C)  Resp: 18   General: Awake, alert, no distress  Cardiovascular: normal s1, s2 sounds, no m/r/g  Respiratory: BBS shallow  Abdomen: soft, nondistended, nontender  Musculoskeletal: no Cyanosis   Discharge Instructions  Discharge Orders   Future Appointments Provider Department Dept Phone   08/29/2013 12:00 PM Star Age, MD [facility] [phone]   12/01/2013 1:15 PM Center, [place] [phone]   Future Orders Complete By Expires   Diet - low sodium heart healthy  As directed    Discharge instructions  As directed    Comments:     Return if symptoms recur, worsen or new problems develop.  Discuss aricept with neurologist Fall precautions  No driving or operating machinery until evaluated by neurologist   Discontinue IV  As directed    Increase activity slowly  As directed  Medication List         acetaminophen 500 MG tablet  Commonly known as:  TYLENOL  Take 500 mg by mouth 2 (two) times daily.     aspirin 325 MG EC tablet  Take 1 tablet (325 mg total) by mouth daily.     carbidopa-levodopa 25-100 MG per tablet  Commonly known as:  SINEMET IR  Take  1 tablet by mouth 3 (three) times daily.     cyclobenzaprine 10 MG tablet  Commonly known as:  FLEXERIL  Take 5 mg by mouth at bedtime.     donepezil 5 MG tablet  Commonly known as:  ARICEPT  Take 1 tablet (5 mg total) by mouth daily.     mesalamine 0.375 G 24 hr capsule  Commonly known as:  APRISO  Take 375 mg by mouth every morning. Each morning     traMADol 50 MG tablet  Commonly known as:  ULTRAM  Take 1 tablet (50 mg total) by mouth every 6 (six) hours as needed for moderate pain or severe pain.     valsartan 160 MG tablet  Commonly known as:  DIOVAN  Take 80 mg by mouth daily.       Allergies  Allergen Reactions  . Lexapro [Escitalopram Oxalate] Other (See Comments)    Unknown: reaction occurred some time ago and pt cannot remember what it was  . Tetracyclines & Related Other (See Comments)    Unknown: Reaction occurred some time ago and pt cannot remember what it was        Follow-up Information   Follow up with [facility] . Schedule an appointment as soon as possible for a visit in 2 weeks. (Establish Care syncope, parkinsons)       Follow up with Sherrie Mustache, MD. Schedule an appointment as soon as possible for a visit in 1 week. [facility] Followup )    Specialty:  Family Medicine   Contact information:   [place] [id number] [phone]      The results of significant diagnostics from this hospitalization (including imaging, microbiology, ancillary and laboratory) are listed below for reference.    Significant Diagnostic Studies: Dg Chest 2 View  08/13/2013   CLINICAL DATA:  Fall, multiple rib fractures  EXAM: CHEST  2 VIEW  COMPARISON:  CT chest 08/12/2013  FINDINGS: The lungs are clear. No pneumothorax or pleural effusion. Nondisplaced rib fractures better seen on recent CT scan. Cardiac and mediastinal contours are within normal limits. Nonspecific visualized bowel gas pattern. Multilevel degenerative change throughout the  spine.  IMPRESSION: No active cardiopulmonary disease. Specifically, no pneumothorax or pleural effusion.  Nondisplaced right-sided rib fractures better seen on yesterday's CT scan.   Electronically Signed   By: Jacqulynn Cadet M.D.   On: 08/13/2013 12:21   Dg Chest 2 View  08/12/2013   CLINICAL DATA:  Loss of consciousness.  Chest pain.  EXAM: CHEST  2 VIEW  COMPARISON:  02/22/2008  FINDINGS: Heart size is normal. The aorta is unremarkable. The lungs are clear. The vascularity is normal. No effusions. Chronic degenerative changes affect the spine.  IMPRESSION: No active cardiopulmonary disease. No cause of chest pain identified.   Electronically Signed   By: Nelson Chimes M.D.   On: 08/12/2013 17:09   Ct Head Wo Contrast  08/12/2013   CLINICAL DATA:  Loss of consciousness.  EXAM: CT HEAD WITHOUT CONTRAST  TECHNIQUE: Contiguous axial images were obtained from the base of the skull through the  vertex without intravenous contrast.  COMPARISON:  02/25/2013.  FINDINGS: Stable noncommunicating hydrocephalus with marked dilatation of the lateral and 3rd ventricles. 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 No extra-axial fluid collections are identified. No acute intracranial findings or mass lesions. The brainstem and cerebellum are grossly normal and stable.  The bony structures are intact. The paranasal sinuses and mastoid air cells are clear.  IMPRESSION: Chronic noncommunicating hydrocephalus.  No acute intracranial findings.   Electronically Signed   By: Kalman Jewels M.D.   On: 08/12/2013 20:10   Ct Angio Chest Pe W/cm &/or Wo Cm  08/12/2013   CLINICAL DATA:  Chest trauma.  Fall.  EXAM: CT ANGIOGRAPHY CHEST WITH CONTRAST  TECHNIQUE: Multidetector CT imaging of the chest was performed using the standard protocol during bolus administration of intravenous contrast. Multiplanar CT image reconstructions including MIPs were obtained to evaluate the vascular anatomy.   CONTRAST:  178mL OMNIPAQUE IOHEXOL 350 MG/ML SOLN  COMPARISON:  None.  FINDINGS: Clinical data specifically requested concern for pulmonary embolus rather than dissection, and accordingly today's exam was performed with pulmonary arterial contrast out demise a shin rather than systemic arterial assessment. Today's exam does not rule out aortic dissection.  No filling defect is identified in the pulmonary arterial tree to suggest pulmonary embolus. No pericardial effusion or significant pleural effusion. Large irregular cystic lesions at the tops of the kidneys noted, possibly from polycystic kidney disease although malignancy is not excluded, and the 9.8 cm left kidney upper pole cyst has somewhat irregular margins and possible internal septations.  Hypodense lesion in the lateral segment left hepatic lobe, image 73 of series 4, 1.5 x 1.2 cm.  Coronary artery atherosclerotic calcification is suspected.  Thoracic spondylosis noted.  No definite sternal fracture.  Nondisplaced fractures of the right 3rd, 4th, 5th, and 6th ribs. There is dependent subsegmental atelectasis in both lower lobes.  Review of the MIP images confirms the above findings.  IMPRESSION: 1. No embolus identified. Today's exam is not sensitive for aortic dissection due to Pulmonary rather than systemic arterial enhancement. 2. Nondisplaced fractures of the right 3rd, 4th, 5th, and 6th ribs are seen. 3. Coronary artery atherosclerosis. 4. Prominent cystic lesions in both kidney upper poles. Left kidney upper pole cystic lesion is large and somewhat irregular. This could be from polycystic kidney disease although cystic renal masses cannot be excluded and only the very top sub the kidneys were included. Correlate with patient history in determining need for further nonemergent imaging workup. 5. Thoracic spondylosis.   Electronically Signed   By: Sherryl Barters M.D.   On: 08/12/2013 20:21   Microbiology: No results found for this or any previous  visit (from the past 240 hour(s)).   Labs: Basic Metabolic Panel:  Recent Labs Lab 08/12/13 1810 08/13/13 0155 08/14/13 0425  NA 139  --  131*  K 4.1  --  3.9  CL 99  --  95*  CO2 27  --  24  GLUCOSE 109*  --  104*  BUN 13  --  12  CREATININE 0.65 0.65 0.63  CALCIUM 9.9  --  8.9   Liver Function Tests:  Recent Labs Lab 08/14/13 0425  AST 16  ALT <5  ALKPHOS 81  BILITOT 0.5  PROT 6.6  ALBUMIN 3.2*   No results found for this basename: LIPASE, AMYLASE,  in the last 168 hours No results found for this basename: AMMONIA,  in the last  168 hours CBC:  Recent Labs Lab 08/12/13 1810 08/13/13 0155 08/14/13 0425  WBC 9.1 8.9 7.0  HGB 16.4 14.5 14.3  HCT 47.2 42.9 40.5  MCV 93.3 92.5 90.0  PLT 209 186 201   Cardiac Enzymes:  Recent Labs Lab 08/13/13 0155  CKTOTAL 198   BNP: BNP (last 3 results)  Recent Labs  08/12/13 1810  PROBNP 83.7   CBG: No results found for this basename: GLUCAP,  in the last 168 hours  Signed:  Adelita Hone [facility] 08/15/2013, 10:54 AM

## 2013-08-15 NOTE — Progress Notes (Signed)
Physical Therapy Treatment Patient Details Name: [name] MRN: [id number] DOB: [birth year] Today's Date: 08/15/2013 Time: 9629-5284 PT Time Calculation (min): 21 min  PT Assessment / Plan / Recommendation  History of Present Illness [name]  is a 73 y.o. male, with past medical history significant for Parkinson disease presenting to today after a fall from a sitting position and syncope. The patient stayed out for around one hour and his wife found him on the floor and was transferred here. No preceding chest pains headache or dizziness. Patient was admitted on July of last year for another syncopal episode with negative echo and carotid Dopplers. Patient complains of chest tenderness after the fall and was found to have right-sided rib fractures and a right facial abrasions and was seen by trauma surgery who will follow him during his stay.    PT Comments   Pt could benefit from OPPT to work on dynamic balance and gait stability/speed.   Follow Up Recommendations  Outpatient PT;Supervision/Assistance - 24 hour;Supervision for mobility/OOB     Does the patient have the potential to tolerate intense rehabilitation     Barriers to Discharge        Equipment Recommendations  None recommended by PT    Recommendations for Other Services    Frequency Min 3X/week   Progress towards PT Goals Progress towards PT goals: Progressing toward goals  Plan Current plan remains appropriate    Precautions / Restrictions Precautions Precautions: Fall Precaution Comments: pt reports he has had multiple syncopal episodes  Restrictions Weight Bearing Restrictions: No   Pertinent Vitals/Pain     Mobility  Bed Mobility Bed Mobility: Supine to Sit;Sitting - Scoot to Edge of Bed Supine to Sit: 6: Modified independent (Device/Increase time) Sitting - Scoot to Edge of Bed: 6: Modified independent (Device/Increase time) Transfers Transfers: Sit to Stand;Stand to Sit Sit to Stand: 5:  Supervision;With upper extremity assist;From bed Stand to Sit: 4: Min guard;With upper extremity assist;To bed;To chair/3-in-1 Details for Transfer Assistance: supervision for safety only, pt used safe technique. Ambulation/Gait Ambulation/Gait Assistance: 5: Supervision Ambulation Distance (Feet): 140 Feet Assistive device: Small based quad cane Ambulation/Gait Assistance Details: gait characterized by shuffled steps, occaisionally festinating, with little  clearance throughout the swing phase.  Changes in direction consistently produces some instability, which pt manages well with cane Gait Pattern: Decreased step length - right;Decreased stride length;Shuffle;Wide base of support Gait velocity: slower  Stairs: No Wheelchair Mobility Wheelchair Mobility: No    Exercises     PT Diagnosis:    PT Problem List:   PT Treatment Interventions:     PT Goals (current goals can now be found in the care plan section) Acute Rehab PT Goals PT Goal Formulation: With patient Time For Goal Achievement: 08/27/13 Potential to Achieve Goals: Good  Visit Information  Last PT Received On: 08/15/13 Assistance Needed: +1 History of Present Illness: [name]  is a 73 y.o. male, with past medical history significant for Parkinson disease presenting to today after a fall from a sitting position and syncope. The patient stayed out for around one hour and his wife found him on the floor and was transferred here. No preceding chest pains headache or dizziness. Patient was admitted on July of last year for another syncopal episode with negative echo and carotid Dopplers. Patient complains of chest tenderness after the fall and was found to have right-sided rib fractures and a right facial abrasions and was seen by trauma surgery who will follow him during  his stay.     Subjective Data  Subjective: Yeah, my gait can get alittle better, but I usually shuffle esp. with the cane.   Cognition   Cognition Arousal/Alertness: Awake/alert Behavior During Therapy: WFL for tasks assessed/performed Overall Cognitive Status: Within Functional Limits for tasks assessed    Balance     End of Session PT - End of Session Activity Tolerance: Patient tolerated treatment well Patient left: in chair;with call bell/phone within reach Nurse Communication: Mobility status   GP     Toneshia Coello, Tessie Fass 08/15/2013, 12:29 PM 08/15/2013  Donnella Sham, PT [phone]  (pager)

## 2013-08-15 NOTE — Discharge Instructions (Signed)
Fall Prevention and Home Safety Falls cause injuries and can affect all age groups. It is possible to use preventive measures to significantly decrease the likelihood of falls. There are many simple measures which can make your home safer and prevent falls. OUTDOORS  Repair cracks and edges of walkways and driveways.  Remove high doorway thresholds.  Trim shrubbery on the main path into your home.  Have good outside lighting.  Clear walkways of tools, rocks, debris, and clutter.  Check that handrails are not broken and are securely fastened. Both sides of steps should have handrails.  Have leaves, snow, and ice cleared regularly.  Use sand or salt on walkways during winter months.  In the garage, clean up grease or oil spills. BATHROOM  Install night lights.  Install grab bars by the toilet and in the tub and shower.  Use non-skid mats or decals in the tub or shower.  Place a plastic non-slip stool in the shower to sit on, if needed.  Keep floors dry and clean up all water on the floor immediately.  Remove soap buildup in the tub or shower on a regular basis.  Secure bath mats with non-slip, double-sided rug tape.  Remove throw rugs and tripping hazards from the floors. BEDROOMS  Install night lights.  Make sure a bedside light is easy to reach.  Do not use oversized bedding.  Keep a telephone by your bedside.  Have a firm chair with side arms to use for getting dressed.  Remove throw rugs and tripping hazards from the floor. KITCHEN  Keep handles on pots and pans turned toward the center of the stove. Use back burners when possible.  Clean up spills quickly and allow time for drying.  Avoid walking on wet floors.  Avoid hot utensils and knives.  Position shelves so they are not too high or low.  Place commonly used objects within easy reach.  If necessary, use a sturdy step stool with a grab bar when reaching.  Keep electrical cables out of the  way.  Do not use floor polish or wax that makes floors slippery. If you must use wax, use non-skid floor wax.  Remove throw rugs and tripping hazards from the floor. STAIRWAYS  Never leave objects on stairs.  Place handrails on both sides of stairways and use them. Fix any loose handrails. Make sure handrails on both sides of the stairways are as long as the stairs.  Check carpeting to make sure it is firmly attached along stairs. Make repairs to worn or loose carpet promptly.  Avoid placing throw rugs at the top or bottom of stairways, or properly secure the rug with carpet tape to prevent slippage. Get rid of throw rugs, if possible.  Have an electrician put in a light switch at the top and bottom of the stairs. OTHER FALL PREVENTION TIPS  Wear low-heel or rubber-soled shoes that are supportive and fit well. Wear closed toe shoes.  When using a stepladder, make sure it is fully opened and both spreaders are firmly locked. Do not climb a closed stepladder.  Add color or contrast paint or tape to grab bars and handrails in your home. Place contrasting color strips on first and last steps.  Learn and use mobility aids as needed. Install an electrical emergency response system.  Turn on lights to avoid dark areas. Replace light bulbs that burn out immediately. Get light switches that glow.  Arrange furniture to create clear pathways. Keep furniture in the same place.  Firmly attach carpet with non-skid or double-sided tape.  Eliminate uneven floor surfaces.  Select a carpet pattern that does not visually hide the edge of steps.  Be aware of all pets. OTHER HOME SAFETY TIPS  Set the water temperature for 120 F (48.8 C).  Keep emergency numbers on or near the telephone.  Keep smoke detectors on every level of the home and near sleeping areas. Document Released: 07/18/2002 Document Revised: 01/27/2012 Document Reviewed: 10/17/2011 [facility] Patient Information 2014  [place].  Rib Fracture A rib fracture is a break or crack in one of the bones of the ribs. The ribs are a group of long, curved bones that wrap around your chest and attach to your spine. They protect your lungs and other organs in the chest cavity. A broken or cracked rib is often painful, but most do not cause other problems. Most rib fractures heal on their own over time. However, rib fractures can be more serious if multiple ribs are broken or if broken ribs move out of place and push against other structures. CAUSES   A direct blow to the chest. For example, this could happen during contact sports, a car accident, or a fall against a hard object.  Repetitive movements with high force, such as pitching a baseball or having severe coughing spells. SYMPTOMS   Pain when you breathe in or cough.  Pain when someone presses on the injured area. DIAGNOSIS  Your caregiver will perform a physical exam. Various imaging tests may be ordered to confirm the diagnosis and to look for related injuries. These tests may include a chest X-ray, computed tomography (CT), magnetic resonance imaging (MRI), or a bone scan. TREATMENT  Rib fractures usually heal on their own in 1 3 months. The longer healing period is often associated with a continued cough or other aggravating activities. During the healing period, pain control is very important. Medication is usually given to control pain. Hospitalization or surgery may be needed for more severe injuries, such as those in which multiple ribs are broken or the ribs have moved out of place.  HOME CARE INSTRUCTIONS   Avoid strenuous activity and any activities or movements that cause pain. Be careful during activities and avoid bumping the injured rib.  Gradually increase activity as directed by your caregiver.  Only take over-the-counter or prescription medications as directed by your caregiver. Do not take other medications without asking your caregiver  first.  Apply ice to the injured area for the first 1 2 days after you have been treated or as directed by your caregiver. Applying ice helps to reduce inflammation and pain.  Put ice in a plastic bag.  Place a towel between your skin and the bag.   Leave the ice on for 15 20 minutes at a time, every 2 hours while you are awake.  Perform deep breathing as directed by your caregiver. This will help prevent pneumonia, which is a common complication of a broken rib. Your caregiver may instruct you to:  Take deep breaths several times a day.  Try to cough several times a day, holding a pillow against the injured area.  Use a device called an incentive spirometer to practice deep breathing several times a day.  Drink enough fluids to keep your urine clear or pale yellow. This will help you avoid constipation.   Do not wear a rib belt or binder. These restrict breathing, which can lead to pneumonia.  SEEK IMMEDIATE MEDICAL CARE IF:  You have a fever.   You have difficulty breathing or shortness of breath.   You develop a continual cough, or you cough up thick or bloody sputum.  You feel sick to your stomach (nausea), throw up (vomit), or have abdominal pain.   You have worsening pain not controlled with medications.  MAKE SURE YOU:  Understand these instructions.  Will watch your condition.  Will get help right away if you are not doing well or get worse. Document Released: 07/28/2005 Document Revised: 03/30/2013 Document Reviewed: 09/29/2012 [facility] Patient Information 2014 [place], [place].  Syncope Syncope means a person passes out (faints). The person usually wakes up in less than 5 minutes. It is important to seek medical care for syncope. HOME CARE  Have someone stay with you until you feel normal.  Do not drive, use machines, or play sports until your doctor says it is okay.  Keep all doctor visits as told.  Lie down when you feel like you might pass out.  Take deep breaths. Wait until you feel normal before standing up.  Drink enough fluids to keep your pee (urine) clear or pale yellow.  If you take blood pressure or heart medicine, get up slowly. Take several minutes to sit and then stand. GET HELP RIGHT AWAY IF:   You have a severe headache.  You have pain in the chest, belly (abdomen), or back.  You are bleeding from the mouth or butt (rectum).  You have black or tarry poop (stool).  You have an irregular or very fast heartbeat.  You have pain with breathing.  You keep passing out, or you have shaking (seizures) when you pass out.  You pass out when sitting or lying down.  You feel confused.  You have trouble walking.  You have severe weakness.  You have vision problems. If you fainted, call your local emergency services (911 in [place] Do not drive yourself to the hospital. MAKE SURE YOU:   Understand these instructions.  Will watch your condition.  Will get help right away if you are not doing well or get worse. Document Released: 01/14/2008 Document Revised: 01/27/2012 Document Reviewed: 09/26/2011 [facility] Patient Information 2014 [place], [place].  Parkinson Disease Parkinson disease is a disorder of the brain and spinal cord (central nervous system). The person will slowly lose the ability to control his or her body movements. This happens due to: Damaged nerve cells. Low levels of a certain brain chemical. HOME CARE Exercise often. Make time to rest during the day. Take all medicine as told by your doctor. Replace buttons and zippers with elastic and Velcro if getting dressed is difficult. Put grab bars or rails in your home. This helps you to not fall. Go to speech therapy or therapy to help you with daily activities (occupational therapy). Do this as told by your doctor. Keep all doctor visits as told. GET HELP IF: Your medicine does not help your symptoms. You fall. You have trouble swallowing or  choke on your food. MAKE SURE YOU: Understand these instructions. Will watch your condition. Will get help right away if you are not doing well or get worse. Document Released: 10/20/2011 Document Revised: 11/22/2012 Document Reviewed: 10/20/2011 [facility] Patient Information 2014 [place].

## 2013-08-19 ENCOUNTER — Telehealth: Payer: Self-pay

## 2013-08-19 NOTE — Telephone Encounter (Signed)
I called and spoke with patient's wife. Patient went to [name] as referral for potential parkinsonism. [name] was to refer for MRI. They are thinking either parkinsonism or side effect of aricept. Patient being weaned off aricept but, wife concerned that [name] did not make referrals. Patient has passed out two times in past 6 months. If MD is thinking parkinsonism then the MRI referral should have been made.   Family wants to stay with [place]. Wife is asking that MD be sensitive to their needs.

## 2013-08-29 ENCOUNTER — Encounter: Payer: Self-pay | Admitting: Neurology

## 2013-08-29 ENCOUNTER — Ambulatory Visit (INDEPENDENT_AMBULATORY_CARE_PROVIDER_SITE_OTHER): Payer: Medicare Other | Admitting: Neurology

## 2013-08-29 VITALS — BP 127/81 | HR 65 | Temp 97.0°F | Ht 66.5 in | Wt 160.0 lb

## 2013-08-29 DIAGNOSIS — R269 Unspecified abnormalities of gait and mobility: Secondary | ICD-10-CM

## 2013-08-29 DIAGNOSIS — G911 Obstructive hydrocephalus: Secondary | ICD-10-CM

## 2013-08-29 DIAGNOSIS — R55 Syncope and collapse: Secondary | ICD-10-CM

## 2013-08-29 DIAGNOSIS — G20 Parkinson's disease: Secondary | ICD-10-CM

## 2013-08-29 MED ORDER — CARBIDOPA-LEVODOPA 25-100 MG PO TABS: 1.0000 | ORAL_TABLET | Freq: Three times a day (TID) | ORAL | Status: DC

## 2013-08-29 NOTE — Patient Instructions (Signed)
We can consider a DaT scan, approved for investigating parkinsonism. We can schedule this at your next convenience.  Continue Sinemet at the current dose.  Taper off Aricept as planned.  I can refer you to a neurosurgeon for opinion of a shunt, if you wish.

## 2013-08-29 NOTE — Progress Notes (Signed)
Subjective:    Patient ID: [name] is a 73 y.o. male.  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 He is accompanied by his wife again today. I last saw him on 03/03/2013 after a recent hospitalization for syncope. He has since then been seen by cardiology and also saw [name] at [facility] neurology. I reviewed notes from [name] as well as [name] in cardiology. [name] saw the patient on 04/04/13 and felt, he had some parkinsonism but not idiopathic Parkinson's disease. She noted signs of peripheral neuropathy and added further labs. She referred him for speech therapy. His blood work showed a normal IFE, normal RPR, normal folate level, and a low normal B12 level and she recommended starting B12 vitamin. He saw [name] on 03/14/2013 who stopped his beta blocker and did not find any Cardiologic etiology of his syncope. He was hospitalized from 08/12/2013 through 08/15/2013 after a syncopal event resulting in a fall during which he sustained rib fractures. He was kept on Sinemet. He has not had any falls or syncopal since then. He was also advised that his syncope may come from Aricept and he was on 1/2 pill qHS for one week, then 1/2 pill qod for one week. He had a CT chest on 08/12/13 and a CXR on 08/13/13, which I reviewed the reports of. He had a [place] on 08/12/13, which I reviewed through the PACS system. I also reviewed the report: Chronic noncommunicating hydrocephalus. No acute intracranial findings.   Today, he reports that he is going to be off of Aricept soon. He has some residual rib soreness. He has an appointment with his PCP later this month. He has had no dizzy spells or presyncopal symptoms. He feels quite stable. His wife is wondering whether he needs to be evaluated for shunt. However, she also adds that they are terrified of trying to pursue a shunt. He has been off of levodopa in the past  and felt that his gait got worse. He feels that levodopa has been helping his walking. He was evaluated for hearing loss and is in the process of getting hearing aids. He also has an appointment for cognitive testing which was delayed because of his recent admission. He also wanted to delay it until after he has his hearing aids. He finished speech therapy.   I first met him on 02/04/2013 with a history of parkinsonism and gait disorder. He previously followed with [name] and was last seen by him on 10/05/2012, and which time [name] felt that his gait was improved on levodopa therapy. 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 He started having difficulty with his walking after prostate surgery in 2009. 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 He had an EEG in December 2011 showing transient slowing in the right central temporal region. He has had memory loss. 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 Since he had been doing well on levodopa therapy, I kept him on the same dose. The patient was admitted to [facility] on 02/25/2013 and discharged on 02/27/2013 due to syncope and collapse. The patient has had 2 similar episodes in the past within the last 4 years. He was admitted with telemetry monitoring and it showed  no dysrhythmias. EEG was normal in the awake and drowsy state. His carotid Doppler study showed mild to moderate soft plaque in the common carotid artery and origin of the internal carotid artery on the right and minimal plaque on the left. Vertebral artery flow was antegrade. Cardiac echo showed mild AS and trivial aortic and tricuspid regurgitation as well as mitral regurgitation. Ejection fraction was preserved at 55%. No wall motion abnormalities were seen. He had serial cardiac enzymes which were negative. He was started on Lopressor.  Head CT from 02/25/2013 showed no acute intracranial abnormalities, brain atrophy, similar appearance of chronic hydrocephalus.   Her Past Medical History Is Significant For: Past Medical History  Diagnosis Date  . Memory loss   . Benign paroxysmal positional vertigo   . Other and unspecified hyperlipidemia   . Unspecified essential hypertension   . Obstructive hydrocephalus   . Abnormality of gait   . Basal cell carcinoma     "primarily on my face; I've had a bunch" (02/25/2013)  . 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 Asthma     "I've outgrown that now; I was on allergy shots for awhile" (02/25/2013)  . Arthritis     "left knee" (02/25/2013)  . 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 Ulcerative colitis     Her Past Surgical History Is Significant For: Past Surgical History  Procedure Laterality Date  . Suprapubic prostatectomy  2009  . Basal cell carcinoma excision Left 2000's    points to nasal fold  (02/25/2013)    Her Family History Is Significant For: Family History  Problem Relation Age of Onset  . Heart failure Father   . Dementia Mother     Her Social History Is Significant For: History   Social History  . Marital Status: Married    Spouse Name: N/A    Number of Children: N/A  . Years of Education: N/A   Social History Main Topics  . Smoking status: Former Smoker -- 8 years    Types: Pipe    Quit date: 04/04/1998  . Smokeless tobacco: Never Used     Comment: 02/25/2013 "quit smoking > 20 yr ago"  . Alcohol Use: No  . Drug Use: No  . Sexual Activity: Yes   Other Topics Concern  . None   Social History Narrative  . None    Her Allergies Are:  Allergies  Allergen Reactions  . Lexapro [Escitalopram Oxalate] Other (See Comments)    Unknown: reaction occurred some time ago and pt cannot remember what it was  . Tetracyclines & Related Other (See Comments)    Unknown: Reaction  occurred some time ago and pt cannot remember what it was   :   Her Current Medications Are:  Outpatient Encounter Prescriptions as of 08/29/2013  Medication Sig  . acetaminophen (TYLENOL) 500 MG tablet Take 500 mg by mouth every 8 (eight) hours as needed.   . carbidopa-levodopa (SINEMET IR) 25-100 MG per tablet Take 1 tablet by mouth 3 (three) times daily.  . Cyanocobalamin (B-12 PO) Take 1 capsule by mouth daily.  . cyclobenzaprine (FLEXERIL) 10 MG tablet Take 5 mg by mouth at bedtime.   . donepezil (ARICEPT) 5 MG tablet Take 1 tablet (5 mg total) by mouth daily.  . mesalamine (APRISO) 0.375 G 24 hr capsule Take 375 mg by mouth every morning. Each morning  . Pantoprazole Sodium (  PROTONIX PO) Take 1 application by mouth 2 (two) times daily.  . valsartan (DIOVAN) 160 MG tablet Take 80 mg by mouth daily.   . traMADol (ULTRAM) 50 MG tablet Take 1 tablet (50 mg total) by mouth every 6 (six) hours as needed for moderate pain or severe pain.  . [DISCONTINUED] aspirin EC 325 MG EC tablet Take 1 tablet (325 mg total) by mouth daily.   Review of Systems:  Out of a complete 14 point review of systems, all are reviewed and negative with the exception of these symptoms as listed below:  Review of Systems  Constitutional: Negative.   HENT: Positive for hearing loss.   Eyes: Negative.   Respiratory: Negative.   Cardiovascular: Negative.   Gastrointestinal: Negative.   Endocrine: Negative.   Genitourinary: Negative.   Musculoskeletal: Positive for arthralgias and gait problem.  Skin: Negative.   Allergic/Immunologic: Negative.   Neurological: Positive for syncope.  Hematological: Bruises/bleeds easily.  Psychiatric/Behavioral: Negative.     Objective:  Neurologic Exam  Physical Exam Physical Examination:   Filed Vitals:   08/29/13 1158  BP: 127/81  Pulse: 65  Temp: 97 F (36.1 C)   General Examination: The patient is a very pleasant 73 y.o. male in no acute distress. He denies  lightheadedness upon standing.  HEENT: Macrocephalic, atraumatic, pupils are equal, round and reactive to light and accommodation. Extraocular tracking shows mild saccadic breakdown without nystagmus noted. There is no limitation to gaze. There is no decrease in eye blink rate. Hearing is impaired mildly. Tympanic membranes are clear bilaterally. Face is symmetric with no facial masking and normal facial sensation. There is no lip, neck or jaw tremor. Neck is not rigid with intact passive ROM. There are no carotid bruits on auscultation. Oropharynx exam reveals mild mouth dryness. No significant airway crowding is noted. Mallampati is class II. Tongue protrudes centrally and palate elevates symmetrically. There is no drooling.   Chest: is clear to auscultation without wheezing, rhonchi or crackles noted.  Heart: sounds are regular and normal without murmurs, rubs or gallops noted.   Abdomen: is soft, non-tender and non-distended with normal bowel sounds appreciated on auscultation.  Extremities: There is no pitting edema in the distal lower extremities bilaterally. Pedal pulses are intact.   Skin: is warm and dry with no trophic changes noted. Age-related changes are noted on the skin.   Musculoskeletal: exam reveals: Left knee swelling and genu varus on the left.  Neurologically:  Mental status: The patient is awake and alert, paying good  attention. He is able to completely provide the history. His wife provides additional details. He is oriented to: person, place, time/date, situation, day of week, month of year and year. His memory, attention, language and knowledge are fairly well intact. There is no aphasia, agnosia, apraxia or anomia. There is a no degree of bradyphrenia. Speech is mildly hypophonic with no dysarthria noted. Mood is congruent and affect is normal.   Cranial nerves are as described above under HEENT exam. In addition, shoulder shrug is normal with equal shoulder height  noted.  Motor exam: Normal bulk, and strength for age is noted. There are no dyskinesias noted. Tone is not rigid with absence of cogwheeling in the extremities. There is overall no bradykinesia. There is no drift or rebound.  There is no tremor.   Romberg is negative, but he has to stand wide-based.  Reflexes are 1+ in the upper extremities and 1+ in the lower extremities. Fine motor skills exam: Fine  motor skills are fairly well preserved in the upper extremities and mildly impaired in the lower extremities. Cerebellar testing shows no dysmetria or intention tremor on finger to nose testing. Heel to shin is unremarkable bilaterally. There is no truncal or gait ataxia.   Sensory exam is intact to light touch, pinprick, vibration, temperature sense and proprioception in the upper and lower extremities.   Gait, station and balance: He stands up with mild difficulty and has to push himself up. He stands wide-based. He is bowlegged especially in the left. He has no significant start hesitation or freezing. He walks with his 2 wheeled walker and maneuvers it well. His balance is mildly impaired. He does not have a typical parkinsonian shuffle. His posture is fairly age-appropriate. He turns in for steps.  Assessment and Plan:   In summary, Sherrick Araki is a very pleasant 73 year old male with a history of hydrocephalus which is likely congenital and gait disorder which is probably multifactorial. He has had recent syncopal spells including a recent fall with rib fractures. He has been advised to taper off of Aricept and he is in the process of coming off of it altogether. He feels stable at this time. His exam is stable. He has some parkinsonism but no frank signs and symptoms of Parkinson's disease. He was asking about additional testing for Parkinson's disease. I explained to him that there is no definitive test for Parkinson's disease. His wife was asking if he needs a brain MRI but I do not feel  he needs a brain MRI at this time. His recent head CT was stable and negative for any acute findings. I offered him a referral to neurosurgery for shunt evaluation but he declined. I suggested we pursue a DaT scan to investigate parkinsonism and he would like to think about it. I provided him with information material on this special scan for which he would have to go to [facility] or [place]. He will call us back if he wants to pursue this. His wife was asking whether he needs to switch levodopa to another Parkinson's medication and I suggested that we keep everything stable and have him come off of Aricept and follow him clinically. While he most likely does not have idiopathic Parkinson's disease he has benefited from levodopa therapy and I do not think we should rock the boat at this time. They were in agreement. I will see him back in about 3 months. In the interim, he will pursue his hearing aids, pursue cognitive testing as previously arranged and I will also do memory testing off of Aricept next time he comes in. I answered all their questions today and the patient and his wife were in agreement with the above outlined plan. I would like to see the patient back in 3 months, sooner if the need arises and encouraged them to call with any interim questions, concerns, problems or updates.

## 2013-09-16 ENCOUNTER — Other Ambulatory Visit: Payer: Self-pay | Admitting: Dermatology

## 2013-10-03 ENCOUNTER — Encounter (INDEPENDENT_AMBULATORY_CARE_PROVIDER_SITE_OTHER): Payer: Self-pay | Admitting: Surgery

## 2013-10-03 ENCOUNTER — Ambulatory Visit (INDEPENDENT_AMBULATORY_CARE_PROVIDER_SITE_OTHER): Payer: Medicare Other | Admitting: Surgery

## 2013-10-03 VITALS — BP 150/92 | HR 84 | Temp 98.0°F | Resp 14 | Ht 66.0 in | Wt 163.8 lb

## 2013-10-03 DIAGNOSIS — C439 Malignant melanoma of skin, unspecified: Secondary | ICD-10-CM

## 2013-10-03 NOTE — Progress Notes (Signed)
Patient ID: [name]., male   DOB: 04/07/1941, 73 y.o.   MRN: [id number]  Chief Complaint  Patient presents with  . 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 is a 73 y.o. male.  Patient sent request of Dr. Elvera Lennox for left arm melanoma. Patient had a lesion removed from his left forearm just lateral to the elbow which was found to be a superficial spreading melanoma tumor thickness of 0.83 mm anatomic level IV with no ulceration and no evidence of vascular invasion. The margins were positive. The area is not ulcerated or bleeding. 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 He walks with a walker. He had a prostatectomy in 2009 problems after this his neurological issues. Patient's wife feels may be secondary anesthesia issues. HPI  Past Medical History  Diagnosis Date  . Memory loss   . Benign paroxysmal positional vertigo   . Other and unspecified hyperlipidemia   . Unspecified essential hypertension   . Obstructive hydrocephalus   . Abnormality of gait   . Basal cell carcinoma     "primarily on my face; I've had a bunch" (02/25/2013)  . 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 Asthma     "I've outgrown that now; I was on allergy shots for awhile" (02/25/2013)  . Arthritis     "left knee" (02/25/2013)  . 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 Ulcerative colitis     Past Surgical History  Procedure Laterality Date  . Suprapubic prostatectomy  2009  . Basal cell carcinoma excision Left 2000's    points to nasal fold  (02/25/2013)    Family History  Problem Relation Age of Onset  . Heart failure Father   . Heart disease Father   . Dementia Mother     Social History History  Substance Use Topics  . Smoking status: Former Smoker -- 8 years    Types: Pipe    Quit date: 04/04/1998  . Smokeless tobacco: Never  Used     Comment: 02/25/2013 "quit smoking > 20 yr ago"  . Alcohol Use: No    Allergies  Allergen Reactions  . Lexapro [Escitalopram Oxalate] Other (See Comments)    Unknown: reaction occurred some time ago and pt cannot remember what it was  . Tetracyclines & Related Other (See Comments)    Unknown: Reaction occurred some time ago and pt cannot remember what it was     Current Outpatient Prescriptions  Medication Sig Dispense Refill  . carbidopa-levodopa (SINEMET IR) 25-100 MG per tablet Take 1 tablet by mouth 3 (three) times daily.  270 tablet  3  . Cyanocobalamin (B-12 PO) Take 1 capsule by mouth daily.      . cyclobenzaprine (FLEXERIL) 10 MG tablet Take 5 mg by mouth at bedtime.       . mesalamine (APRISO) 0.375 G 24 hr capsule Take 375 mg by mouth every morning. Each morning      . Pantoprazole Sodium (PROTONIX PO) Take 1 application by mouth 2 (two) times daily.      . valsartan (DIOVAN) 160 MG tablet Take 80 mg by mouth daily.       Marland Kitchen acetaminophen (TYLENOL) 500 MG tablet Take 500 mg by mouth every 8 (eight) hours as needed.        No  current facility-administered medications for this visit.    Review of Systems Review of Systems  HENT: Negative.   Eyes: Negative.   Respiratory: Negative.   Cardiovascular: Negative.   Gastrointestinal: Negative.   Endocrine: Negative.   Musculoskeletal: Positive for gait problem.  Skin: Positive for wound.  Neurological: Positive for tremors and weakness.    Blood pressure 150/92, pulse 84, temperature 98 F (36.7 C), temperature source Oral, resp. rate 14, height 5' 6" (1.676 m), weight 163 lb 12.8 oz (74.299 kg).  Physical Exam Physical Exam  Constitutional: He is oriented to person, place, and time. He appears well-developed and well-nourished.  HENT:  Head: Normocephalic and atraumatic.  Eyes: Pupils are equal, round, and reactive to light. No scleral icterus.  Neck: Normal range of motion. Neck supple.  Cardiovascular:  Normal rate and regular rhythm.   Pulmonary/Chest: Effort normal and breath sounds normal.  Musculoskeletal: Normal range of motion.  Lymphadenopathy:    He has no cervical adenopathy.    He has no axillary adenopathy.       Right: No supraclavicular adenopathy present.       Left: No supraclavicular adenopathy present.  Neurological: He is alert and oriented to person, place, and time.  Skin: Skin is warm.     Psychiatric: He has a normal mood and affect. His behavior is normal. Judgment and thought content normal.    Data Reviewed Skin Biopsy-(P), (A) left antecubital area, shave MALIGNANT MELANOMA MELANOMA TABLEPROCEDURE: SHAVE BIOPSY SPECIMEN INCLUDING LATERALITY (IF SPECIFIED)/ANATOMIC SITE: LEFT ANTECUBITAL AREA HISTOLOGIC TYPE: SUPERFICIAL SPREADING, ARISING IN A NEVUS MAXIMUM TUMOR THICKNESS: 0.83 MM*TO BASE/DEEP MARGIN ANATOMIC LEVEL: IV ULCERATION: ABSENT MARGINS PERIPHERAL MARGINS: INVOLVED DEEP MARGIN: INVOLVED MITOTIC INDEX: >1/MM2 LYMPH-VASCULAR INVASION: ABSENT TUMOR-INFILTRATING LYMPHOCYTES: NON-BRISK TUMOR REGRESSION: ABSENT LYMPH NODES (IF APPLICABLE): N/A PATHOLOGIC STAGE: PT1B NX MX COMMENT: THIS MELANOMA APPEARS TO BE ARISING IN A PRE-EXISTING NEVUS; BENIGN NEVOCELLULAR CELLS ARE SUBJACENT TO THE DIAGNOSTIC INVASIVE MELANOMA. A COMPLETE RE-EXCISION IS RECOMMENDED.   Assessment    Melanoma left forearm     Plan    Recommend wide excision left forearm melanoma with left axillary sentinel lymph node mapping. Discussed procedures at length with the patient and his wife today. Risks, benefits and possible future therapy discussed. They would like to proceed. He is a problem of general anesthesia in the past and I think prudent that he and his wife speak with anesthesia in the preoperative period. We'll obtain preop lymphoscintigraphy.The procedure has been discussed with the patient.  Alternative therapies have been discussed with the patient.  Operative  risks include bleeding,  Infection,  Organ injury,  Nerve injury,  Blood vessel injury,  DVT,  Pulmonary embolism,  Death,  And possible reoperation.  Medical management risks include worsening of present situation.  The success of the procedure is 50 -90 % at treating patients symptoms.  The patient understands and agrees to proceed.       Jaylianna Tatlock A. 10/03/2013, 4:24 PM    

## 2013-10-03 NOTE — Patient Instructions (Signed)
Melanoma Melanoma is the least common, but most dangerous, form of skin cancer. This is because it can spread (metastasize) to other organs and can be life-threatening. Melanoma is a cancerous (malignant) tumor that begins in a certain type of cells, called melanocytes. Melanocytes are the cells that produce the color (pigment) called melanin. Melanin colors our skin, hair, eyes, and moles. CAUSES  The exact cause of melanoma is unknown. You may have a higher risk if you:  Spend or have spent a lot of time in the sun. This includes sunlamp and tanning booth exposure.  Have had sunburns. This put you at a particularly increased risk for melanoma. The more blistering sunburns a person has, the higher the risk.  Spend time in parts of the world with more intense sunlight.  Have fair skin that does not tan easily. You may have a lower risk if you have a darker skin color. However, people with darker skin can get melanoma, especially on the hands and feet (acral areas).  Have a close relative (parent, sibling) who has melanoma.  Have a large number of skin moles (more than 100). SYMPTOMS  A skin mole is suspicious if it has any of these 5 traits. This is called the ABCDE's of melanoma:  Asymmetry: Irregular shape, not simply round or oval.  Border: Edge of the mole is irregular, not smooth.  Color: Mole may have multiple colors in it, including brown, black, blue, red, or tan.  Diameter: More than 0.2 inches (6 mm) across.  Evolving: Any unusual change or symptoms in the mole, such as pain, itching, stinging, sensitivity, or bleeding. A mole that is noticeably changing in appearance, or any new mole, should be checked for melanoma. In general, people develop new moles until age 76. New moles after this age should be brought to the attention of your caregiver. DIAGNOSIS  Your caregiver can look at your skin and find lesions or moles that may be suspicious. A patient may also notice a mole  with symptoms or a mole that does not look like most of the other moles on his or her body. This is called the "ugly duckling" sign. A tissue sample (biopsy) examined under a microscope is needed to determine if it is melanoma. The size and extent of the biopsy will depend on the location, size, and appearance of the skin lesion or mole. The biopsy can also reveal whether melanoma has spread to deeper layers of the skin. TREATMENT  Surgery to completely remove the melanoma is required. Lymph nodes may also be removed. If the melanoma has spread to other organs, such as the liver, lungs, bone, or brain, cancer-fighting drugs (chemotherapy) must be used. Your caregiver will discuss your treatment options with you. You can ask about being included in a clinical trial to evaluate new forms of treatment. Melanoma can occasionally recur years after the initial diagnosis. If you have melanoma, you will need follow-up visits with your caregiver for many years. PREVENTION  Risk for melanoma can be reduced by minimizing sun exposure. Practice the 3 S's:  Slip on a shirt.  Slop on sunscreen.  Slap on a hat. Do not spend time in the sun during peak midafternoon hours. Sunscreen/sunblock with SPF 30 or higher and UVA/UVB block should be applied regularly. You should do this even during brief exposure to sunlight. You should also do this on cloudy days and in winter, even though the perceived sunlight is less. Always avoid sunburn! Wear sunglasses that block UV  light. Be sure to see your caregiver if you have any new or changing moles. HOME CARE INSTRUCTIONS   Follow wound care instructions after surgical removal of your melanoma.  Practice good sun avoidance and protective measures as described above.  Let your close family members (parents, children, siblings) know about your diagnosis. This puts them at a higher risk of getting melanoma than the general population. SEEK MEDICAL CARE IF:   You notice any  new moles, or you have any moles that are changing.  You have had a melanoma removed and you notice a new growth near the same location.  You have had a melanoma removed and you experience any new or unexplained health problems. Document Released: 07/28/2005 Document Revised: 10/20/2011 Document Reviewed: 11/16/2009 [facility] Patient Information 2014 [place], [place].

## 2013-10-07 ENCOUNTER — Telehealth (INDEPENDENT_AMBULATORY_CARE_PROVIDER_SITE_OTHER): Payer: Self-pay | Admitting: *Deleted

## 2013-10-07 NOTE — Telephone Encounter (Signed)
I spoke with pt and informed him of the appt for his lymphoscintigraphy at [facility] on 10/17/13 with an arrival time of 8:45am.  He is agreeable with this appt.

## 2013-10-11 ENCOUNTER — Encounter (HOSPITAL_COMMUNITY): Payer: Self-pay | Admitting: Pharmacy Technician

## 2013-10-14 ENCOUNTER — Other Ambulatory Visit (HOSPITAL_COMMUNITY): Payer: Medicare Other

## 2013-10-17 ENCOUNTER — Ambulatory Visit (HOSPITAL_COMMUNITY)
Admission: RE | Admit: 2013-10-17 | Discharge: 2013-10-17 | Disposition: A | Discharge status: Home / Self Care | Payer: Medicare Other | Source: Ambulatory Visit | Attending: Surgery | Admitting: Surgery

## 2013-10-17 DIAGNOSIS — C4360 Malignant melanoma of unspecified upper limb, including shoulder: Secondary | ICD-10-CM | POA: Insufficient documentation

## 2013-10-17 DIAGNOSIS — C439 Malignant melanoma of skin, unspecified: Secondary | ICD-10-CM

## 2013-10-17 MED ORDER — TECHNETIUM TC 99M SULFUR COLLOID FILTERED
0.5000 | Freq: Once | INTRAVENOUS | Status: AC | PRN
Start: 2013-10-17 — End: 2013-10-17

## 2013-10-17 MED ORDER — TECHNETIUM TC 99M SULFUR COLLOID FILTERED
0.5000 | Freq: Once | INTRAVENOUS | Status: AC | PRN
Start: 2013-10-17 — End: 2013-10-17
  Administered 2013-10-17: 0.5 via INTRADERMAL

## 2013-10-21 ENCOUNTER — Encounter (HOSPITAL_COMMUNITY)
Admission: RE | Admit: 2013-10-21 | Discharge: 2013-10-21 | Disposition: A | Discharge status: Home / Self Care | Payer: Medicare Other | Source: Ambulatory Visit | Attending: Surgery | Admitting: Surgery

## 2013-10-21 ENCOUNTER — Encounter (HOSPITAL_COMMUNITY): Payer: Self-pay

## 2013-10-21 VITALS — BP 148/88 | HR 74 | Temp 97.5°F | Resp 18 | Ht 66.0 in | Wt 163.3 lb

## 2013-10-21 DIAGNOSIS — Z01812 Encounter for preprocedural laboratory examination: Secondary | ICD-10-CM | POA: Insufficient documentation

## 2013-10-21 DIAGNOSIS — C439 Malignant melanoma of skin, unspecified: Secondary | ICD-10-CM

## 2013-10-21 HISTORY — DX: Personal history of colonic polyps: Z86.010

## 2013-10-21 HISTORY — DX: Unspecified cataract: H26.9

## 2013-10-21 HISTORY — DX: Personal history of other medical treatment: Z92.89

## 2013-10-21 HISTORY — DX: Reserved for inherently not codable concepts without codable children: IMO0001

## 2013-10-21 HISTORY — DX: Effusion, unspecified joint: M25.40

## 2013-10-21 HISTORY — DX: Personal history of colon polyps, unspecified: Z86.0100

## 2013-10-21 HISTORY — DX: Gastro-esophageal reflux disease without esophagitis: K21.9

## 2013-10-21 LAB — CBC WITH DIFFERENTIAL/PLATELET
Basophils Absolute: 0.1 10*3/uL (ref 0.0–0.1)
Basophils Relative: 1 % (ref 0–1)
Eosinophils Absolute: 0.1 10*3/uL (ref 0.0–0.7)
Eosinophils Relative: 2 % (ref 0–5)
HCT: 42 % (ref 39.0–52.0)
Hemoglobin: 14.7 g/dL (ref 13.0–17.0)
Lymphocytes Relative: 25 % (ref 12–46)
Lymphs Abs: 1.4 10*3/uL (ref 0.7–4.0)
MCH: 31.4 pg (ref 26.0–34.0)
MCHC: 35 g/dL (ref 30.0–36.0)
MCV: 89.7 fL (ref 78.0–100.0)
Monocytes Absolute: 0.6 10*3/uL (ref 0.1–1.0)
Monocytes Relative: 10 % (ref 3–12)
Neutro Abs: 3.5 10*3/uL (ref 1.7–7.7)
Neutrophils Relative %: 62 % (ref 43–77)
Platelets: 215 10*3/uL (ref 150–400)
RBC: 4.68 MIL/uL (ref 4.22–5.81)
RDW: 13.1 % (ref 11.5–15.5)
WBC: 5.7 10*3/uL (ref 4.0–10.5)

## 2013-10-21 LAB — COMPREHENSIVE METABOLIC PANEL
ALT: 8 U/L (ref 0–53)
AST: 22 U/L (ref 0–37)
Albumin: 3.5 g/dL (ref 3.5–5.2)
Alkaline Phosphatase: 110 U/L (ref 39–117)
BUN: 11 mg/dL (ref 6–23)
CO2: 26 mEq/L (ref 19–32)
Calcium: 9.5 mg/dL (ref 8.4–10.5)
Chloride: 97 mEq/L (ref 96–112)
Creatinine, Ser: 0.61 mg/dL (ref 0.50–1.35)
GFR calc Af Amer: >90 mL/min (ref >90)
GFR calc non Af Amer: >90 mL/min (ref >90)
Glucose, Bld: 86 mg/dL (ref 70–99)
Potassium: 4.7 mEq/L (ref 3.7–5.3)
Sodium: 136 mEq/L — ABNORMAL LOW (ref 137–147)
Total Bilirubin: 0.3 mg/dL (ref 0.3–1.2)
Total Protein: 6.9 g/dL (ref 6.0–8.3)

## 2013-10-21 MED ORDER — CHLORHEXIDINE GLUCONATE 4 % EX LIQD: 1.0000 "application " | Freq: Once | CUTANEOUS | Status: DC

## 2013-10-21 NOTE — Progress Notes (Addendum)
Saw a cardiologist 1 time last July and was told that he didn't even needed a cardiologist-saw Dr.Nishan with report in epic  Stress test done at least 34yrs ago  Denies ever having a heart cath     Echo reports in epic from 2006/2014  EKG in epic from 08-12-13  CXR in epic from 08-13-13  [place] is Dr.Richard Nyland

## 2013-10-21 NOTE — Progress Notes (Signed)
Anesthesia PAT Evaluation:  Patient is a 73 year old male scheduled for wide excision of a left arm melanoma with left axillary SN biopsy on 10/25/13 by Dr. Brantley Stage.  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 Wife feels his gait disorder starting following his prostate surgery in 2009. He has refused refused referral to neurosurgery for shunt evaluation. He denied prior CVA and only reports occasional wine use. 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 PCP is Dr. Dione Housekeeper. He saw cardiologist Dr. [name] 03/2013 for evaluation of syncope.  Beta blocker therapy was discontinued, but cardiac etiology was not suspected. Neurologist is Dr. Star Age with [facility].  Anesthesia concerns:  Patient has known obstruction hydrocephalus that was found many years ago when his ophthalmologist discussed peripheral vision field cuts.  Otherwise he had been asymptomatic.  In 2009 he had prostatectomy.  He and his wife were told that case would be done under spinal anesthesia but he felt that without much input from him or further explanation, a decision was made for general anesthesia.  His wife said he was very "sick" afterwards--not so much N/V, but he was lethargic, weak (required transfusion).  Reportedly he had barely been out of bed prior to discharge, so they did not realize that he had a new gait disturbance  until he arrived home.  He has since required a cane or rolling walker to ambulate.  Gait is shuffled.  Reportedly there were no new changes on brain imaging to suggest that as a cause of his new gait abnormality. Although they know the cause of his gait disturbance is not really known, they can't help but think that his general anesthesia (or something in surgery) caused or contributed.  His wife is especially fearful that he may not walk again if he has another surgery under general anesthesia.     EKG on 08/12/13 showed SR, PACs, abnormal r wave progression, early transition.  Echo on 02/26/13 showed:  - Left ventricle: Systolic function was normal. The estimated ejection fraction was in the range of 55% to 60%. Left ventricular diastolic function parameters were normal. - Aortic valve: Moderately thickened with mild calcification, with slightly reduced excursion. Velocity and gradients were not calculated, but stenosis appears to be mild. Mild regurgitation. - Mitral valve: Mildly thickened leaflets . Mild regurgitation. - Tricuspid valve: Mild regurgitation. - Pulmonary arteries: PA peak pressure: 64mm Hg (S).  Carotid duplex on 02/26/13 showed: Right: mild to moderate soft plaque CCA and origin ICA. Left: minimal plaque noted. Bilateral: 0-39% ICA stenosis. Vertebral artery flow is antegrade. ICA/CCA ratio: R-1.0 L-0.88.  CXR on 08/13/13 showed: No active cardiopulmonary disease. Specifically, no pneumothorax or pleural effusion. Nondisplaced right-sided rib fractures better seen on yesterday's CT scan.  Head CT on 08/12/13 showed: FINDINGS: Stable noncommunicating hydrocephalus with marked dilatation of the lateral and 3rd ventricles. 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 No extra-axial fluid collections are identified. No acute intracranial findings or mass lesions. The brainstem  and cerebellum are grossly normal and stable. The bony structures are intact.  The paranasal sinuses and mastoid air cells are clear.  IMPRESSION: Chronic noncommunicating hydrocephalus. No acute intracranial findings.   Preoperative labs noted.    Anesthesiologist Dr. Berdine Addison and I talked with patient and his wife during his PAT visit.  As above, they know that the anesthesia may not have played any role in his gait abnormality, but because of the unknown they remain concerned.  I requested his 2009 anesthesia record from [facility].  Wife reports that that Dr. Brantley Stage felt there was a possibility that his procedure could be done under local with MAC.  Dr. Linna Caprice also spoke with them about a nerve block with ultrasound guidance. Currently that is his preference (over general anesthesia).  Dr. Linna Caprice plans to be the assigned anesthesiologist.  Regardless of anesthesia type, they know his anesthesia team will take his past history into account when developing his anesthesia plan.    George Hugh [facility] Short Stay Center/Anesthesiology Phone [phone] 10/21/2013 12:54 PM

## 2013-10-21 NOTE — Anesthesia Preprocedure Evaluation (Addendum)
Anesthesia Evaluation  Patient identified by MRN, date of birth, ID band Patient awake    Reviewed: Allergy & Precautions, H&P , NPO status , Patient's Chart, lab work & pertinent test results  Airway Mallampati: II TM Distance: >3 FB Neck ROM: Limited    Dental  (+) Dental Advisory Given, Teeth Intact   Pulmonary asthma ,          Cardiovascular hypertension,     Neuro/Psych  Neuromuscular disease    GI/Hepatic PUD,   Endo/Other    Renal/GU      Musculoskeletal   Abdominal   Peds  Hematology   Anesthesia Other Findings Parkinsonism  Reproductive/Obstetrics                         Anesthesia Physical Anesthesia Plan  ASA: II  Anesthesia Plan: General   Post-op Pain Management:    Induction: Intravenous  Airway Management Planned: LMA and Oral ETT  Additional Equipment:   Intra-op Plan:   Post-operative Plan: Extubation in OR  Informed Consent: I have reviewed the patients History and Physical, chart, labs and discussed the procedure including the risks, benefits and alternatives for the proposed anesthesia with the patient or authorized representative who has indicated his/her understanding and acceptance.   Dental advisory given  Plan Discussed with: CRNA, Anesthesiologist and Surgeon  Anesthesia Plan Comments: (See my anesthesia note.  Dr. Linna Caprice spoke with patient at PAT regarding [place] concerns.  Wants nerve block in hoped to avoid GA.  Myra Gianotti, PA-C)      Anesthesia Quick Evaluation

## 2013-10-21 NOTE — Pre-Procedure Instructions (Signed)
[name].  10/21/2013   Your procedure is scheduled on:  Tues, Mar 17 @ 9:00 AM  Report to [facility]  at 7:00 AM.  Call this number if you have problems the morning of surgery: [phone]   Remember:   Do not eat food or drink liquids after midnight.   Take these medicines the morning of surgery with A SIP OF WATER: Carbidopa-Levodopa(Sinemet) and Protonix(Pantoprazole)               No Goody's,BC's,Aleve,Ibuprofen,Aspirin,Fish Oil,or any Herbal Medications   Do not wear jewelry  Do not wear lotions, powders, or colognes. You may wear deodorant.  Men may shave face and neck.  Do not bring valuables to the hospital.  [facility] is not responsible                  for any belongings or valuables.               Contacts, dentures or bridgework may not be worn into surgery.  Leave suitcase in the car. After surgery it may be brought to your room.  For patients admitted to the hospital, discharge time is determined by your                treatment team.               Patients discharged the day of surgery will not be allowed to drive  home.    Special Instructions:  [place] - Preparing for Surgery  Before surgery, you can play an important role.  Because skin is not sterile, your skin needs to be as free of germs as possible.  You can reduce the number of germs on you skin by washing with CHG (chlorahexidine gluconate) soap before surgery.  CHG is an antiseptic cleaner which kills germs and bonds with the skin to continue killing germs even after washing.  Please DO NOT use if you have an allergy to CHG or antibacterial soaps.  If your skin becomes reddened/irritated stop using the CHG and inform your nurse when you arrive at Short Stay.  Do not shave (including legs and underarms) for at least 48 hours prior to the first CHG shower.  You may shave your face.  Please follow these instructions carefully:   1.  Shower with CHG Soap the night before surgery and  the                                morning of Surgery.  2.  If you choose to wash your hair, wash your hair first as usual with your       normal shampoo.  3.  After you shampoo, rinse your hair and body thoroughly to remove the                      Shampoo.  4.  Use CHG as you would any other liquid soap.  You can apply chg directly       to the skin and wash gently with scrungie or a clean washcloth.  5.  Apply the CHG Soap to your body ONLY FROM THE NECK DOWN.        Do not use on open wounds or open sores.  Avoid contact with your eyes,       ears, mouth and genitals (private parts).  Wash genitals (private parts)  with your normal soap.  6.  Wash thoroughly, paying special attention to the area where your surgery        will be performed.  7.  Thoroughly rinse your body with warm water from the neck down.  8.  DO NOT shower/wash with your normal soap after using and rinsing off       the CHG Soap.  9.  Pat yourself dry with a clean towel.            10.  Wear clean pajamas.            11.  Place clean sheets on your bed the night of your first shower and do not        sleep with pets.  Day of Surgery  Do not apply any lotions/deoderants the morning of surgery.  Please wear clean clothes to the hospital/surgery center.     Please read over the following fact sheets that you were given: Pain Booklet, Coughing and Deep Breathing and Surgical Site Infection Prevention

## 2013-10-24 MED ORDER — CEFAZOLIN SODIUM-DEXTROSE 2-3 GM-% IV SOLR
2.0000 g | INTRAVENOUS | Status: AC
  Administered 2013-10-25: 2 g via INTRAVENOUS
  Filled 2013-10-24: qty 50

## 2013-10-25 ENCOUNTER — Ambulatory Visit (HOSPITAL_COMMUNITY)
Admission: RE | Admit: 2013-10-25 | Discharge: 2013-10-25 | Disposition: A | Discharge status: Home / Self Care | Payer: Medicare Other | Source: Ambulatory Visit | Attending: Surgery | Admitting: Surgery

## 2013-10-25 ENCOUNTER — Encounter (HOSPITAL_COMMUNITY)
Admission: RE | Disposition: A | Discharge status: Home / Self Care | Payer: Self-pay | Source: Ambulatory Visit | Attending: Surgery

## 2013-10-25 ENCOUNTER — Encounter (HOSPITAL_COMMUNITY): Payer: Medicare Other | Admitting: Vascular Surgery

## 2013-10-25 ENCOUNTER — Encounter (HOSPITAL_COMMUNITY)
Admission: RE | Admit: 2013-10-25 | Discharge: 2013-10-25 | Disposition: A | Discharge status: Home / Self Care | Payer: Medicare Other | Source: Ambulatory Visit | Attending: Surgery | Admitting: Surgery

## 2013-10-25 ENCOUNTER — Ambulatory Visit (HOSPITAL_COMMUNITY): Payer: Medicare Other | Admitting: Anesthesiology

## 2013-10-25 ENCOUNTER — Encounter (HOSPITAL_COMMUNITY): Payer: Self-pay | Admitting: *Deleted

## 2013-10-25 DIAGNOSIS — G20A1 Parkinson's disease without dyskinesia, without mention of fluctuations: Secondary | ICD-10-CM | POA: Insufficient documentation

## 2013-10-25 DIAGNOSIS — G911 Obstructive hydrocephalus: Secondary | ICD-10-CM | POA: Insufficient documentation

## 2013-10-25 DIAGNOSIS — J45909 Unspecified asthma, uncomplicated: Secondary | ICD-10-CM | POA: Insufficient documentation

## 2013-10-25 DIAGNOSIS — C4360 Malignant melanoma of unspecified upper limb, including shoulder: Secondary | ICD-10-CM | POA: Insufficient documentation

## 2013-10-25 DIAGNOSIS — M1710 Unilateral primary osteoarthritis, unspecified knee: Secondary | ICD-10-CM | POA: Insufficient documentation

## 2013-10-25 DIAGNOSIS — I10 Essential (primary) hypertension: Secondary | ICD-10-CM | POA: Insufficient documentation

## 2013-10-25 DIAGNOSIS — G20 Parkinson's disease: Secondary | ICD-10-CM | POA: Insufficient documentation

## 2013-10-25 DIAGNOSIS — Z87891 Personal history of nicotine dependence: Secondary | ICD-10-CM | POA: Insufficient documentation

## 2013-10-25 DIAGNOSIS — R413 Other amnesia: Secondary | ICD-10-CM | POA: Insufficient documentation

## 2013-10-25 DIAGNOSIS — C439 Malignant melanoma of skin, unspecified: Secondary | ICD-10-CM

## 2013-10-25 DIAGNOSIS — R269 Unspecified abnormalities of gait and mobility: Secondary | ICD-10-CM | POA: Insufficient documentation

## 2013-10-25 DIAGNOSIS — H8110 Benign paroxysmal vertigo, unspecified ear: Secondary | ICD-10-CM | POA: Insufficient documentation

## 2013-10-25 DIAGNOSIS — Z85828 Personal history of other malignant neoplasm of skin: Secondary | ICD-10-CM | POA: Insufficient documentation

## 2013-10-25 DIAGNOSIS — K5190 Ulcerative colitis, unspecified, without complications: Secondary | ICD-10-CM | POA: Insufficient documentation

## 2013-10-25 DIAGNOSIS — IMO0002 Reserved for concepts with insufficient information to code with codable children: Secondary | ICD-10-CM

## 2013-10-25 DIAGNOSIS — E785 Hyperlipidemia, unspecified: Secondary | ICD-10-CM | POA: Insufficient documentation

## 2013-10-25 HISTORY — PX: MELANOMA EXCISION WITH SENTINEL LYMPH NODE BIOPSY: SHX5267

## 2013-10-25 SURGERY — MELANOMA EXCISION WITH SENTINEL LYMPH NODE BIOPSY
Anesthesia: General | Site: Axilla | Laterality: Left

## 2013-10-25 MED ORDER — LIDOCAINE HCL (CARDIAC) 20 MG/ML IV SOLN
INTRAVENOUS | Status: DC | PRN
  Administered 2013-10-25: 100 mg via INTRAVENOUS

## 2013-10-25 MED ORDER — PROPOFOL INFUSION 10 MG/ML OPTIME
INTRAVENOUS | Status: DC | PRN
  Administered 2013-10-25: 160 ug/kg/min via INTRAVENOUS

## 2013-10-25 MED ORDER — ONDANSETRON HCL 4 MG/2ML IJ SOLN: 4.0000 mg | Freq: Once | INTRAMUSCULAR | Status: DC | PRN

## 2013-10-25 MED ORDER — HYDROCODONE-ACETAMINOPHEN 5-325 MG PO TABS: 1.0000 | ORAL_TABLET | Freq: Four times a day (QID) | ORAL | Status: DC | PRN

## 2013-10-25 MED ORDER — HYDROMORPHONE HCL PF 1 MG/ML IJ SOLN: 0.2500 mg | INTRAMUSCULAR | Status: DC | PRN

## 2013-10-25 MED ORDER — LACTATED RINGERS IV SOLN
INTRAVENOUS | Status: DC
  Administered 2013-10-25: 08:00:00 via INTRAVENOUS

## 2013-10-25 MED ORDER — LIDOCAINE HCL (CARDIAC) 20 MG/ML IV SOLN
INTRAVENOUS | Status: AC
  Filled 2013-10-25: qty 5

## 2013-10-25 MED ORDER — FENTANYL CITRATE 0.05 MG/ML IJ SOLN
INTRAMUSCULAR | Status: DC | PRN
  Administered 2013-10-25: 100 ug via INTRAVENOUS

## 2013-10-25 MED ORDER — TECHNETIUM TC 99M SULFUR COLLOID FILTERED
0.5000 | Freq: Once | INTRAVENOUS | Status: AC | PRN
  Administered 2013-10-25: 0.5 via INTRADERMAL

## 2013-10-25 MED ORDER — BUPIVACAINE-EPINEPHRINE PF 0.25-1:200000 % IJ SOLN
INTRAMUSCULAR | Status: DC | PRN
  Administered 2013-10-25: 21 mL via PERINEURAL

## 2013-10-25 MED ORDER — SODIUM CHLORIDE 0.9 % IJ SOLN
INTRAMUSCULAR | Status: DC | PRN
  Administered 2013-10-25: 3 mL via INTRAVENOUS

## 2013-10-25 MED ORDER — PROPOFOL 10 MG/ML IV BOLUS
INTRAVENOUS | Status: DC | PRN
  Administered 2013-10-25: 100 mg via INTRAVENOUS

## 2013-10-25 MED ORDER — FENTANYL CITRATE 0.05 MG/ML IJ SOLN
INTRAMUSCULAR | Status: AC
  Filled 2013-10-25: qty 5

## 2013-10-25 MED ORDER — BUPIVACAINE-EPINEPHRINE (PF) 0.25% -1:200000 IJ SOLN
INTRAMUSCULAR | Status: AC
  Filled 2013-10-25: qty 30

## 2013-10-25 MED ORDER — METHYLENE BLUE 1 % INJ SOLN
INTRAMUSCULAR | Status: AC
  Filled 2013-10-25: qty 10

## 2013-10-25 MED ORDER — PROPOFOL 10 MG/ML IV EMUL
INTRAVENOUS | Status: AC
  Filled 2013-10-25: qty 50

## 2013-10-25 MED ORDER — ROCURONIUM BROMIDE 50 MG/5ML IV SOLN
INTRAVENOUS | Status: AC
  Filled 2013-10-25: qty 1

## 2013-10-25 MED ORDER — METHYLENE BLUE 1 % INJ SOLN
INTRAMUSCULAR | Status: DC | PRN
  Administered 2013-10-25: 5 mL via SUBMUCOSAL

## 2013-10-25 MED ORDER — 0.9 % SODIUM CHLORIDE (POUR BTL) OPTIME
TOPICAL | Status: DC | PRN
  Administered 2013-10-25: 1000 mL

## 2013-10-25 SURGICAL SUPPLY — 59 items
ADH SKN CLS LQ APL DERMABOND (GAUZE/BANDAGES/DRESSINGS) ×2
APPLIER CLIP 11 MED OPEN (CLIP)
APR CLP MED 11 20 MLT OPN (CLIP)
BLADE SURG 10 STRL SS (BLADE) ×2 IMPLANT
BLADE SURG 15 STRL LF DISP TIS (BLADE) ×1 IMPLANT
BLADE SURG 15 STRL SS (BLADE) ×2
BNDG COHESIVE 4X5 TAN STRL (GAUZE/BANDAGES/DRESSINGS) ×1 IMPLANT
CANISTER SUCTION 2500CC (MISCELLANEOUS) IMPLANT
CHLORAPREP W/TINT 26ML (MISCELLANEOUS) ×2 IMPLANT
CLIP APPLIE 11 MED OPEN (CLIP) IMPLANT
CONT SPEC 4OZ CLIKSEAL STRL BL (MISCELLANEOUS) ×2 IMPLANT
COVER PROBE W GEL 5X96 (DRAPES) ×2 IMPLANT
COVER SURGICAL LIGHT HANDLE (MISCELLANEOUS) ×2 IMPLANT
DERMABOND ADHESIVE PROPEN (GAUZE/BANDAGES/DRESSINGS) ×2
DERMABOND ADVANCED .7 DNX6 (GAUZE/BANDAGES/DRESSINGS) IMPLANT
DRAIN CHANNEL 19F RND (DRAIN) IMPLANT
DRAPE LAPAROSCOPIC ABDOMINAL (DRAPES) ×2 IMPLANT
DRAPE PROXIMA HALF (DRAPES) ×1 IMPLANT
DRAPE UTILITY 15X26 W/TAPE STR (DRAPE) ×4 IMPLANT
ELECT CAUTERY BLADE 6.4 (BLADE) ×2 IMPLANT
ELECT REM PT RETURN 9FT ADLT (ELECTROSURGICAL) ×2
ELECTRODE REM PT RTRN 9FT ADLT (ELECTROSURGICAL) ×1 IMPLANT
EVACUATOR SILICONE 100CC (DRAIN) IMPLANT
GLOVE BIO SURGEON STRL SZ8 (GLOVE) ×2 IMPLANT
GLOVE BIOGEL PI IND STRL 8 (GLOVE) ×1 IMPLANT
GLOVE BIOGEL PI INDICATOR 8 (GLOVE) ×1
GOWN STRL REUS W/ TWL LRG LVL3 (GOWN DISPOSABLE) ×2 IMPLANT
GOWN STRL REUS W/ TWL XL LVL3 (GOWN DISPOSABLE) ×1 IMPLANT
GOWN STRL REUS W/TWL LRG LVL3 (GOWN DISPOSABLE) ×4
GOWN STRL REUS W/TWL XL LVL3 (GOWN DISPOSABLE) ×2
KIT BASIN OR (CUSTOM PROCEDURE TRAY) ×2 IMPLANT
KIT ROOM TURNOVER OR (KITS) ×2 IMPLANT
NDL 18GX1X1/2 (RX/OR ONLY) (NEEDLE) ×1 IMPLANT
NDL HYPO 25GX1X1/2 BEV (NEEDLE) ×1 IMPLANT
NEEDLE 18GX1X1/2 (RX/OR ONLY) (NEEDLE) ×2 IMPLANT
NEEDLE HYPO 25GX1X1/2 BEV (NEEDLE) ×2 IMPLANT
NS IRRIG 1000ML POUR BTL (IV SOLUTION) ×2 IMPLANT
PACK SURGICAL SETUP 50X90 (CUSTOM PROCEDURE TRAY) ×2 IMPLANT
PAD ARMBOARD 7.5X6 YLW CONV (MISCELLANEOUS) ×4 IMPLANT
PENCIL BUTTON HOLSTER BLD 10FT (ELECTRODE) ×2 IMPLANT
SPONGE GAUZE 4X4 12PLY (GAUZE/BANDAGES/DRESSINGS) ×2 IMPLANT
SPONGE LAP 4X18 X RAY DECT (DISPOSABLE) ×2 IMPLANT
STAPLER VISISTAT 35W (STAPLE) IMPLANT
STOCKINETTE IMPERVIOUS 9X36 MD (GAUZE/BANDAGES/DRESSINGS) ×1 IMPLANT
SUT ETHILON 2 0 FS 18 (SUTURE) ×1 IMPLANT
SUT ETHILON 3 0 FSL (SUTURE) IMPLANT
SUT MNCRL AB 3-0 PS2 18 (SUTURE) ×1 IMPLANT
SUT MON AB 4-0 PC3 18 (SUTURE) ×2 IMPLANT
SUT SILK 2 0 SH (SUTURE) IMPLANT
SUT VIC AB 0 CT1 27 (SUTURE) ×2
SUT VIC AB 0 CT1 27XBRD ANBCTR (SUTURE) IMPLANT
SUT VIC AB 3-0 SH 27 (SUTURE) ×2
SUT VIC AB 3-0 SH 27XBRD (SUTURE) ×1 IMPLANT
SYR CONTROL 10ML LL (SYRINGE) ×2 IMPLANT
TOWEL OR 17X24 6PK STRL BLUE (TOWEL DISPOSABLE) ×2 IMPLANT
TOWEL OR 17X26 10 PK STRL BLUE (TOWEL DISPOSABLE) ×2 IMPLANT
TUBE CONNECTING 12X1/4 (SUCTIONS) IMPLANT
WATER STERILE IRR 1000ML POUR (IV SOLUTION) IMPLANT
YANKAUER SUCT BULB TIP NO VENT (SUCTIONS) IMPLANT

## 2013-10-25 NOTE — Anesthesia Procedure Notes (Signed)
Procedure Name: LMA Insertion Date/Time: 10/25/2013 8:56 AM Performed by: Melina Copa, Fredia Chittenden R Pre-anesthesia Checklist: Patient identified, Emergency Drugs available, Suction available, Patient being monitored and Timeout performed Patient Re-evaluated:Patient Re-evaluated prior to inductionOxygen Delivery Method: Circle system utilized Preoxygenation: Pre-oxygenation with 100% oxygen Intubation Type: IV induction Ventilation: Mask ventilation without difficulty LMA Size: 5.0 Number of attempts: 1 Placement Confirmation: ETT inserted through vocal cords under direct vision,  positive ETCO2 and breath sounds checked- equal and bilateral Tube secured with: Tape Dental Injury: Teeth and Oropharynx as per pre-operative assessment

## 2013-10-25 NOTE — Transfer of Care (Signed)
Immediate Anesthesia Transfer of Care Note  Patient: [name].  Procedure(s) Performed: Procedure(s): WIDE EXCISION MELANOMA LEFT ARM WITH LEFT AXILLARY  SENTINEL LYMPH NODE BIOPSY (Left)  Patient Location: PACU  Anesthesia Type:General  Level of Consciousness: awake and alert   Airway & Oxygen Therapy: Patient Spontanous Breathing and Patient connected to nasal cannula oxygen  Post-op Assessment: Report given to PACU RN and Post -op Vital signs reviewed and stable  Post vital signs: Reviewed and stable  Complications: No apparent anesthesia complications

## 2013-10-25 NOTE — H&P (View-Only) (Signed)
Patient ID: Gregory Blackerby., male   DOB: 04/07/1941, 73 y.o.   MRN: [id number]  Chief Complaint  Patient presents with  . 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 is a 73 y.o. male.  Patient sent request of Dr. Elvera Lennox for left arm melanoma. Patient had a lesion removed from his left forearm just lateral to the elbow which was found to be a superficial spreading melanoma tumor thickness of 0.83 mm anatomic level IV with no ulceration and no evidence of vascular invasion. The margins were positive. The area is not ulcerated or bleeding. 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 He walks with a walker. He had a prostatectomy in 2009 problems after this his neurological issues. Patient's wife feels may be secondary anesthesia issues. HPI  Past Medical History  Diagnosis Date  . Memory loss   . Benign paroxysmal positional vertigo   . Other and unspecified hyperlipidemia   . Unspecified essential hypertension   . Obstructive hydrocephalus   . Abnormality of gait   . Basal cell carcinoma     "primarily on my face; I've had a bunch" (02/25/2013)  . 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 Asthma     "I've outgrown that now; I was on allergy shots for awhile" (02/25/2013)  . Arthritis     "left knee" (02/25/2013)  . 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 Ulcerative colitis     Past Surgical History  Procedure Laterality Date  . Suprapubic prostatectomy  2009  . Basal cell carcinoma excision Left 2000's    points to nasal fold  (02/25/2013)    Family History  Problem Relation Age of Onset  . Heart failure Father   . Heart disease Father   . Dementia Mother     Social History History  Substance Use Topics  . Smoking status: Former Smoker -- 8 years    Types: Pipe    Quit date: 04/04/1998  . Smokeless tobacco: Never  Used     Comment: 02/25/2013 "quit smoking > 20 yr ago"  . Alcohol Use: No    Allergies  Allergen Reactions  . Lexapro [Escitalopram Oxalate] Other (See Comments)    Unknown: reaction occurred some time ago and pt cannot remember what it was  . Tetracyclines & Related Other (See Comments)    Unknown: Reaction occurred some time ago and pt cannot remember what it was     Current Outpatient Prescriptions  Medication Sig Dispense Refill  . carbidopa-levodopa (SINEMET IR) 25-100 MG per tablet Take 1 tablet by mouth 3 (three) times daily.  270 tablet  3  . Cyanocobalamin (B-12 PO) Take 1 capsule by mouth daily.      . cyclobenzaprine (FLEXERIL) 10 MG tablet Take 5 mg by mouth at bedtime.       . mesalamine (APRISO) 0.375 G 24 hr capsule Take 375 mg by mouth every morning. Each morning      . Pantoprazole Sodium (PROTONIX PO) Take 1 application by mouth 2 (two) times daily.      . valsartan (DIOVAN) 160 MG tablet Take 80 mg by mouth daily.       Marland Kitchen acetaminophen (TYLENOL) 500 MG tablet Take 500 mg by mouth every 8 (eight) hours as needed.        No  current facility-administered medications for this visit.    Review of Systems Review of Systems  HENT: Negative.   Eyes: Negative.   Respiratory: Negative.   Cardiovascular: Negative.   Gastrointestinal: Negative.   Endocrine: Negative.   Musculoskeletal: Positive for gait problem.  Skin: Positive for wound.  Neurological: Positive for tremors and weakness.    Blood pressure 150/92, pulse 84, temperature 98 F (36.7 C), temperature source Oral, resp. rate 14, height 5\' 6"  (1.676 m), weight 163 lb 12.8 oz (74.299 kg).  Physical Exam Physical Exam  Constitutional: He is oriented to person, place, and time. He appears well-developed and well-nourished.  HENT:  Head: Normocephalic and atraumatic.  Eyes: Pupils are equal, round, and reactive to light. No scleral icterus.  Neck: Normal range of motion. Neck supple.  Cardiovascular:  Normal rate and regular rhythm.   Pulmonary/Chest: Effort normal and breath sounds normal.  Musculoskeletal: Normal range of motion.  Lymphadenopathy:    He has no cervical adenopathy.    He has no axillary adenopathy.       Right: No supraclavicular adenopathy present.       Left: No supraclavicular adenopathy present.  Neurological: He is alert and oriented to person, place, and time.  Skin: Skin is warm.     Psychiatric: He has a normal mood and affect. His behavior is normal. Judgment and thought content normal.    Data Reviewed Skin Biopsy-(P), (A) left antecubital area, shave MALIGNANT MELANOMA MELANOMA TABLEPROCEDURE: SHAVE BIOPSY SPECIMEN INCLUDING LATERALITY (IF SPECIFIED)/ANATOMIC SITE: LEFT ANTECUBITAL AREA HISTOLOGIC TYPE: SUPERFICIAL SPREADING, ARISING IN A NEVUS MAXIMUM TUMOR THICKNESS: 0.83 MM*TO BASE/DEEP MARGIN ANATOMIC LEVEL: IV ULCERATION: ABSENT MARGINS PERIPHERAL MARGINS: INVOLVED DEEP MARGIN: INVOLVED MITOTIC INDEX: >1/MM2 LYMPH-VASCULAR INVASION: ABSENT TUMOR-INFILTRATING LYMPHOCYTES: NON-BRISK TUMOR REGRESSION: ABSENT LYMPH NODES (IF APPLICABLE): N/A PATHOLOGIC STAGE: PT1B NX MX COMMENT: THIS MELANOMA APPEARS TO BE ARISING IN A PRE-EXISTING NEVUS; BENIGN NEVOCELLULAR CELLS ARE SUBJACENT TO THE DIAGNOSTIC INVASIVE MELANOMA. A COMPLETE RE-EXCISION IS RECOMMENDED.   Assessment    Melanoma left forearm     Plan    Recommend wide excision left forearm melanoma with left axillary sentinel lymph node mapping. Discussed procedures at length with the patient and his wife today. Risks, benefits and possible future therapy discussed. They would like to proceed. He is a problem of general anesthesia in the past and I think prudent that he and his wife speak with anesthesia in the preoperative period. We'll obtain preop lymphoscintigraphy.The procedure has been discussed with the patient.  Alternative therapies have been discussed with the patient.  Operative  risks include bleeding,  Infection,  Organ injury,  Nerve injury,  Blood vessel injury,  DVT,  Pulmonary embolism,  Death,  And possible reoperation.  Medical management risks include worsening of present situation.  The success of the procedure is 50 -90 % at treating patients symptoms.  The patient understands and agrees to proceed.       Gregory Rathke A. 10/03/2013, 4:24 PM

## 2013-10-25 NOTE — Interval H&P Note (Signed)
History and Physical Interval Note:  10/25/2013 8:06 AM  [name].  has presented today for surgery, with the diagnosis of melanoma   The various methods of treatment have been discussed with the patient and family. After consideration of risks, benefits and other options for treatment, the patient has consented to  Procedure(s): WIDE EXCISION MELANOMA LEFT ARM WITH LEFT AXILLARY  SENTINEL LYMPH NODE BIOPSY (Left) as a surgical intervention .  The patient's history has been reviewed, patient examined, no change in status, stable for surgery.  I have reviewed the patient's chart and labs.  Questions were answered to the patient's satisfaction.     Nonie Lochner A.

## 2013-10-25 NOTE — Op Note (Signed)
NAMELORANZO, DESHA                   ACCOUNT NO.:  [id number]  MEDICAL RECORD NO.:  [id number]  LOCATION:  [facility]                         FACILITY:  [facility]  PHYSICIAN:  Marcello Moores A. Mckyle Solanki, M.D.DATE OF BIRTH:  Mar 25, 1941  DATE OF PROCEDURE:  10/25/2013 DATE OF DISCHARGE:                              OPERATIVE REPORT   PREOPERATIVE DIAGNOSIS:  Stage II left forearm melanoma.  POSTOPERATIVE DIAGNOSIS:  Stage II left forearm melanoma.  PROCEDURES: 1. Wide excision of left forearm melanoma with intermediate closure.     Length of incision measures 4 cm. 2. Left axillary sentinel lymph node mapping with methylene blue dye.  SURGEON:  Marcello Moores A. Tearia Gibbs, MD.  ANESTHESIA:  LMA, regional, and 0.25% local using 1% bupivacaine with epinephrine.  ESTIMATED BLOOD LOSS:  Minimal.  SPECIMEN: 1. Left arm skin excision site from previous removal of melanoma. 2. Left axillary sentinel node.  DRAINS:  None.  IV FLUIDS:  400 mL crystalloid.  INDICATIONS FOR PROCEDURE:  The patient is a pleasant 73 year old male, who was found to have a left forearm melanoma.  This was stage II, felt by sickness and was superficial spreading in nature.  Recommend wide excision to 1 cm margins.  The patient presents with risks of bleeding, infection, exacerbation of underlying medical problems, as well as left arm lymphedema, pain in shoulder, stiffness, and wound issues.  He understood the above and agreed to proceed.  DESCRIPTION OF PROCEDURE:  The patient was met in the holding area and questions answered.  Left arm was marked.  He received nuclear medicine injection at the previous excision of his melanoma site.  I met with the patient and talked about the procedure with the patient as well as complications, risks, and alternatives.  His wife was present in the room.  Risk of bleeding, infection, wound complications, numbness around the incision, exacerbation of Parkinson's, left arm lymphedema and pain were  all discussed as well as need for further operative procedures or treatments.  He voices understanding of the above treatment plan and agreed to proceed.  After meeting the patient in the holding area and getting his injection, he was taken back to the operating room.  Time-out was done after placing the patient on the table as well and after induction of anesthesia, which was LMA and regional blocks per anesthesia.  Left arm was prepped and draped in sterile fashion and time-out was again done. 2 mL of methylene blue dye admixed with saline were injected under the skin lesion in the left lateral forearm, where his previous excision was.  The sentinel node was done first.  Neoprobe was used, hotspot was identified in the left axilla.  Of note, he received 2 g of Ancef. Incision was made.  Dissection was carried down into the left axilla.  A hot node was identified and removed.  Background counts in the axilla were then 0.  There was trace blue dye in it.  There was no other blue nodes that I could see in the left axilla.  This was then irrigated and closed in 2 layers, a deep layer of 3-0 Vicryl and 4-0 Monocryl subcuticular stitch.  Incision length  was about 2.5 cm.  Left forearm melanoma was then widely excised down to the fascia of the underlying musculature.  This was done with a 1-cm margin circumferentially.  All tissue was removed down to include the fascia. We then irrigated the wound.  There was some tension since this was in the left forearm just lateral to the antecubital fossa, and just distal to that.  We closed it with a deep layer of 0 Vicryl after mobilizing the skin for 1 cm circumferentially.  The length of the incision was about 4 cm.  I then used a 3-0 Monocryl to approximate the skin and then placed 2 interrupted 2-0 nylon sutures in the middle since there was some tension with the arm extended.  Hemostasis was achieved.  All final counts of sponge, needle, and  instruments found to be correct at this portion of the case.  The patient was then awoken, extubated, and taken to recovery in satisfactory condition.     Kemia Wendel A. Trinnity Breunig, M.D.     TAC/MEDQ  D:  10/25/2013  T:  10/25/2013  Job:  [id number]

## 2013-10-25 NOTE — Preoperative (Addendum)
Beta Blockers   Reason not to administer Beta Blockers:Not Applicable 

## 2013-10-25 NOTE — Brief Op Note (Signed)
10/25/2013  9:49 AM  PATIENT:  [name].  73 y.o. male  PRE-OPERATIVE DIAGNOSIS:  melanoma   POST-OPERATIVE DIAGNOSIS:  melanoma   PROCEDURE:  Procedure(s): WIDE EXCISION MELANOMA LEFT ARM WITH LEFT AXILLARY  SENTINEL LYMPH NODE BIOPSY (Left)  SURGEON:  Surgeon(s) and Role:    * Flecia Shutter A. Zola Runion, MD - Primary      ANESTHESIA:   local, regional and LMA  EBL:         LOCAL MEDICATIONS USED:  BUPIVICAINE   SPECIMEN:  Source of Specimen:  LEFT LATERAL FOREARM  AND LEFT AXILLARY LN  DISPOSITION OF SPECIMEN:  PATHOLOGY  COUNTS:  YES  TOURNIQUET:  * No tourniquets in log *  DICTATION: .Other Dictation: Dictation Number [id number]  PLAN OF CARE: Discharge to home after PACU  PATIENT DISPOSITION:  PACU - hemodynamically stable.   Delay start of Pharmacological VTE agent (>24hrs) due to surgical blood loss or risk of bleeding: not applicable

## 2013-10-25 NOTE — Discharge Instructions (Signed)
What to eat:  For your first meals, you should eat lightly; only small meals initially.  If you do not have nausea, you may eat larger meals.  Avoid spicy, greasy and heavy food.    General Anesthesia, Adult, Care After  Refer to this sheet in the next few weeks. These instructions provide you with information on caring for yourself after your procedure. Your health care provider may also give you more specific instructions. Your treatment has been planned according to current medical practices, but problems sometimes occur. Call your health care provider if you have any problems or questions after your procedure.  WHAT TO EXPECT AFTER THE PROCEDURE  After the procedure, it is typical to experience:  Sleepiness.  Nausea and vomiting. HOME CARE INSTRUCTIONS  For the first 24 hours after general anesthesia:  Have a responsible person with you.  Do not drive a car. If you are alone, do not take public transportation.  Do not drink alcohol.  Do not take medicine that has not been prescribed by your health care provider.  Do not sign important papers or make important decisions.  You may resume a normal diet and activities as directed by your health care provider.  Change bandages (dressings) as directed.  If you have questions or problems that seem related to general anesthesia, call the hospital and ask for the anesthetist or anesthesiologist on call. SEEK MEDICAL CARE IF:  You have nausea and vomiting that continue the day after anesthesia.  You develop a rash. SEEK IMMEDIATE MEDICAL CARE IF:  You have difficulty breathing.  You have chest pain.  You have any allergic problems. Document Released: 11/03/2000 Document Revised: 03/30/2013 Document Reviewed: 02/10/2013  [facility] Patient Information 2014 [place], [place].  GENERAL SURGERY: POST OP INSTRUCTIONS  1. DIET: Follow a light bland diet the first 24 hours after arrival home, such as soup, liquids, crackers, etc.  Be sure to include  lots of fluids daily.  Avoid fast food or heavy meals as your are more likely to get nauseated.   2. Take your usually prescribed home medications unless otherwise directed. 3. PAIN CONTROL: a. Pain is best controlled by a usual combination of three different methods TOGETHER: i. Ice/Heat ii. Over the counter pain medication iii. Prescription pain medication b. Most patients will experience some swelling and bruising around the incisions.  Ice packs or heating pads (30-60 minutes up to 6 times a day) will help. Use ice for the first few days to help decrease swelling and bruising, then switch to heat to help relax tight/sore spots and speed recovery.  Some people prefer to use ice alone, heat alone, alternating between ice & heat.  Experiment to what works for you.  Swelling and bruising can take several weeks to resolve.   c. It is helpful to take an over-the-counter pain medication regularly for the first few weeks.  Choose one of the following that works best for you: i. Naproxen (Aleve, etc)  Two 220mg  tabs twice a day ii. Ibuprofen (Advil, etc) Three 200mg  tabs four times a day (every meal & bedtime) iii. Acetaminophen (Tylenol, etc) 500-650mg  four times a day (every meal & bedtime) d. A  prescription for pain medication (such as oxycodone, hydrocodone, etc) should be given to you upon discharge.  Take your pain medication as prescribed.  i. If you are having problems/concerns with the prescription medicine (does not control pain, nausea, vomiting, rash, itching, etc), please call us [phone] to see if we need to  switch you to a different pain medicine that will work better for you and/or control your side effect better. ii. If you need a refill on your pain medication, please contact your pharmacy.  They will contact our office to request authorization. Prescriptions will not be filled after 5 pm or on week-ends. 4. Avoid getting constipated.  Between the surgery and the pain medications,  it is common to experience some constipation.  Increasing fluid intake and taking a fiber supplement (such as Metamucil, Citrucel, FiberCon, MiraLax, etc) 1-2 times a day regularly will usually help prevent this problem from occurring.  A mild laxative (prune juice, Milk of Magnesia, MiraLax, etc) should be taken according to package directions if there are no bowel movements after 48 hours.   5. Wash / shower every day.  You may shower over the dressings as they are waterproof.  Continue to shower over incision(s) after the dressing is off. 6. Remove your waterproof bandages 5 days after surgery.  You may leave the incision open to air.  You may have skin tapes (Steri Strips) covering the incision(s).  Leave them on until one week, then remove.  You may replace a dressing/Band-Aid to cover the incision for comfort if you wish.      7. ACTIVITIES as tolerated:   a. You may resume regular (light) daily activities beginning the next day--such as daily self-care, walking, climbing stairs--gradually increasing activities as tolerated.  If you can walk 30 minutes without difficulty, it is safe to try more intense activity such as jogging, treadmill, bicycling, low-impact aerobics, swimming, etc. b. Save the most intensive and strenuous activity for last such as sit-ups, heavy lifting, contact sports, etc  Refrain from any heavy lifting or straining until you are off narcotics for pain control.   c. DO NOT PUSH THROUGH PAIN.  Let pain be your guide: If it hurts to do something, don't do it.  Pain is your body warning you to avoid that activity for another week until the pain goes down. d. You may drive when you are no longer taking prescription pain medication, you can comfortably wear a seatbelt, and you can safely maneuver your car and apply brakes. e. Dennis Bast may have sexual intercourse when it is comfortable.  8. FOLLOW UP in our office a. Please call [facility] at [phone] to set up an appointment to see  your surgeon in the office for a follow-up appointment approximately 2-3 weeks after your surgery. b. Make sure that you call for this appointment the day you arrive home to insure a convenient appointment time. 9. IF YOU HAVE DISABILITY OR FAMILY LEAVE FORMS, BRING THEM TO THE OFFICE FOR PROCESSING.  DO NOT GIVE THEM TO YOUR DOCTOR.   WHEN TO CALL us [phone]: 1. Poor pain control 2. Reactions / problems with new medications (rash/itching, nausea, etc)  3. Fever over 101.5 F (38.5 C) 4. Worsening swelling or bruising 5. Continued bleeding from incision. 6. Increased pain, redness, or drainage from the incision 7. Difficulty breathing / swallowing   The clinic staff is available to answer your questions during regular business hours (8:30am-5pm).  Please dont hesitate to call and ask to speak to one of our nurses for clinical concerns.   If you have a medical emergency, go to the nearest emergency room or call 911.  A surgeon from [facility] is always on call at the [facility], [place], [place], [place], [place]  [address] ? MAIN: [phone] ? TOLL FREE: [phone] ?  FAX [phone] [email or web address]

## 2013-10-26 NOTE — Anesthesia Postprocedure Evaluation (Signed)
  Anesthesia Post-op Note  Patient: [name].  Procedure(s) Performed: Procedure(s): WIDE EXCISION MELANOMA LEFT ARM WITH LEFT AXILLARY  SENTINEL LYMPH NODE BIOPSY (Left)  Patient Location: PACU  Anesthesia Type:General  Level of Consciousness: awake  Airway and Oxygen Therapy: Patient Spontanous Breathing  Post-op Pain: mild  Post-op Assessment: Post-op Vital signs reviewed, Patient's Cardiovascular Status Stable, Respiratory Function Stable, Patent Airway, No signs of Nausea or vomiting and Pain level controlled  Post-op Vital Signs: stable  Complications: No apparent anesthesia complications

## 2013-10-27 ENCOUNTER — Ambulatory Visit (INDEPENDENT_AMBULATORY_CARE_PROVIDER_SITE_OTHER): Payer: Medicare Other

## 2013-10-27 ENCOUNTER — Telehealth (INDEPENDENT_AMBULATORY_CARE_PROVIDER_SITE_OTHER): Payer: Self-pay

## 2013-10-27 ENCOUNTER — Encounter (INDEPENDENT_AMBULATORY_CARE_PROVIDER_SITE_OTHER): Payer: Self-pay

## 2013-10-27 VITALS — BP 122/84 | HR 80 | Temp 97.9°F | Resp 16 | Wt 164.2 lb

## 2013-10-27 DIAGNOSIS — Z4801 Encounter for change or removal of surgical wound dressing: Secondary | ICD-10-CM

## 2013-10-27 NOTE — Progress Notes (Signed)
Patient comes into office today for nurse visit for assistance with dressing.  Patient s/p Left arm wide excision skin excision w/SLN BX on 10/25/13. Patient wound appears to be healing well.  Patient having difficulty at home with bandage and called the office to have a clinician assist with redressing the wound.  Patient had an area of concern under left axilla.  Reviewed with Dr. Ninfa Linden, no wound concerns seen at this time.  Placed large Telfa gauze on arm excision, wrapped arm lightly with Kerlix wrap.  Patient requested an ace wrap be placed on arm for support.  Placed a tube gauze over kerlix for more support without too much compression to forearm.  Patient tolerated well.  Pathology results reviewed with patient.  Patient has post op appointment scheduled for 11/07/13 @ 2:10 pm w/Dr. Brantley Stage.

## 2013-10-27 NOTE — Telephone Encounter (Signed)
Pt is s/p wide excision of melanoma and axillary lymph node bx by Dr. Brantley Stage on 10/25/13.  His wife is calling for assistance with keeping the bandage on the wound.  "It keeps sliding off."  Recommended she cover the incision with gauze and wrap the elbow with an ace bandage.  Wife will bring the patient this afternoon to see one of the nurses for assistance.

## 2013-10-28 ENCOUNTER — Encounter (HOSPITAL_COMMUNITY): Payer: Self-pay | Admitting: Surgery

## 2013-11-07 ENCOUNTER — Encounter (INDEPENDENT_AMBULATORY_CARE_PROVIDER_SITE_OTHER): Payer: Self-pay | Admitting: Surgery

## 2013-11-07 ENCOUNTER — Ambulatory Visit (INDEPENDENT_AMBULATORY_CARE_PROVIDER_SITE_OTHER): Payer: Medicare Other | Admitting: Surgery

## 2013-11-07 ENCOUNTER — Encounter (INDEPENDENT_AMBULATORY_CARE_PROVIDER_SITE_OTHER): Payer: Medicare Other | Admitting: Surgery

## 2013-11-07 VITALS — BP 134/74 | HR 72 | Temp 97.5°F | Resp 14 | Ht 66.0 in | Wt 162.8 lb

## 2013-11-07 DIAGNOSIS — Z9889 Other specified postprocedural states: Secondary | ICD-10-CM

## 2013-11-07 DIAGNOSIS — Z85820 Personal history of malignant melanoma of skin: Secondary | ICD-10-CM

## 2013-11-07 MED ORDER — CLINDAMYCIN HCL 150 MG PO CAPS: 300.0000 mg | ORAL_CAPSULE | Freq: Three times a day (TID) | ORAL | Status: AC

## 2013-11-07 NOTE — Patient Instructions (Signed)
Neosporin to incision daily AND cover with clean dry gauze.  Take antibiotics until gone.  Return 2 weeks.

## 2013-11-07 NOTE — Progress Notes (Signed)
Patient returns 2 weeks after wide excision left forearm melanoma. He also underwent sentinel lymph node mapping left axilla. Final pathology showed no residual melanoma and a clear sentinel node. He is having some drainage from the incision. He feels well. No fever or chills. He is back playing the organ in church.  Exam: Left forearm wound had 2 sutures removed today. There is mild erythema and mild edema. Left axilla shows an intact incision with mild swelling.  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 I will place him on clindamycin 300 mg by mouth 3 times a day since he has some mild redness around the incision. I instructed his Wife to place , Neosporin on this and loosely wrapped in gauze. Referral to oncology. Return in 2 weeks. Call sooner she has any problems.

## 2013-11-08 ENCOUNTER — Telehealth (INDEPENDENT_AMBULATORY_CARE_PROVIDER_SITE_OTHER): Payer: Self-pay

## 2013-11-08 MED ORDER — CEPHALEXIN 500 MG PO CAPS: 500.0000 mg | ORAL_CAPSULE | Freq: Three times a day (TID) | ORAL | Status: DC

## 2013-11-08 NOTE — Telephone Encounter (Signed)
They all can cause GI issues.  Can call in Bactrim DS #20 1 PO BID IF NO SULFA ALLERGY.  If allergy,  Can call in keflex 50 mg po tid #21. thx TC

## 2013-11-08 NOTE — Telephone Encounter (Signed)
Patient wife calling into office regarding antibiotic that was prescribed yesterday Clindamycin.  Patient wife states after reading the insert regarding medication side effects, she would like to know if his antibiotic can be changed due to his Colitis and Clindamycin increases gastrointestinal issues.  Please advise.

## 2013-11-08 NOTE — Telephone Encounter (Addendum)
Called and spoke to wife regarding change of antibiotic as requested.  Wife will have [name] try Keflex 500 mg, take tid, #21.  Will send rx to [facility].

## 2013-11-08 NOTE — Addendum Note (Signed)
Addended by: Ivor Costa on: 11/08/2013 12:07 PM   Modules accepted: Orders

## 2013-11-09 ENCOUNTER — Telehealth: Payer: Self-pay | Admitting: Internal Medicine

## 2013-11-09 NOTE — Telephone Encounter (Signed)
LEFT MESSAGE FOR PATIENT TO RETURN CALL TO SCHEDULE NEW PATIENT APPT.  °

## 2013-11-09 NOTE — Telephone Encounter (Signed)
S/W PATIENT WIFE AND GAVE NEW PATIENT APPT FOR 04/28 @ 1:30 W/DR. CHISM.  [place] PACKET MAILED.

## 2013-11-09 NOTE — Telephone Encounter (Signed)
C/D 11/09/13 for appt. 12/06/13

## 2013-11-21 ENCOUNTER — Encounter (INDEPENDENT_AMBULATORY_CARE_PROVIDER_SITE_OTHER): Payer: Self-pay | Admitting: Surgery

## 2013-11-21 ENCOUNTER — Ambulatory Visit (INDEPENDENT_AMBULATORY_CARE_PROVIDER_SITE_OTHER): Payer: Medicare Other | Admitting: Surgery

## 2013-11-21 VITALS — BP 156/82 | HR 80 | Temp 97.6°F | Resp 16 | Wt 158.0 lb

## 2013-11-21 DIAGNOSIS — Z9889 Other specified postprocedural states: Secondary | ICD-10-CM

## 2013-11-21 DIAGNOSIS — Z85820 Personal history of malignant melanoma of skin: Secondary | ICD-10-CM

## 2013-11-21 NOTE — Patient Instructions (Signed)
Ok to apply Mederma ad directed in two weeks. Return 6 months.

## 2013-11-21 NOTE — Progress Notes (Signed)
Patient returns 4 weeks after wide excision left forearm melanoma. He also underwent sentinel lymph node mapping left axilla. Final pathology showed no residual melanoma and a clear sentinel node. He is having no  drainage from the incision. He feels well. No fever or chills. He is back playing the organ in church.  Exam: Left forearm wound much improved. There is no  erythema and mild edema. Left axilla shows an intact incision with mild swelling.  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 To se oncology in 2 weeks.  RTC 6 months.

## 2013-12-01 ENCOUNTER — Ambulatory Visit: Payer: Medicare Other | Attending: Physical Therapy | Admitting: Physical Therapy

## 2013-12-06 ENCOUNTER — Encounter: Payer: Self-pay | Admitting: Internal Medicine

## 2013-12-06 ENCOUNTER — Telehealth: Payer: Self-pay | Admitting: Internal Medicine

## 2013-12-06 ENCOUNTER — Ambulatory Visit: Payer: Medicare Other

## 2013-12-06 ENCOUNTER — Other Ambulatory Visit: Payer: Medicare Other

## 2013-12-06 ENCOUNTER — Ambulatory Visit (HOSPITAL_BASED_OUTPATIENT_CLINIC_OR_DEPARTMENT_OTHER): Payer: Medicare Other | Admitting: Internal Medicine

## 2013-12-06 VITALS — BP 156/79 | HR 64 | Temp 97.6°F | Resp 17 | Ht 66.0 in | Wt 161.6 lb

## 2013-12-06 DIAGNOSIS — C4360 Malignant melanoma of unspecified upper limb, including shoulder: Secondary | ICD-10-CM

## 2013-12-06 DIAGNOSIS — C439 Malignant melanoma of skin, unspecified: Secondary | ICD-10-CM

## 2013-12-06 NOTE — Telephone Encounter (Signed)
GA[facility] pt appt for lab and MD on July 2015

## 2013-12-06 NOTE — Progress Notes (Signed)
[facility] [phone]   [phone]  NEW PATIENT EVALUATION   Name: [name]. Date: 12/06/2013 MRN: [id number] DOB: Dec 27, 1940  PCP: Sherrie Mustache, MD   REFERRING PHYSICIAN: Dione Housekeeper, MD  REASON FOR REFERRAL: Melanoma of L arm.   HISTORY OF PRESENT ILLNESS:[name]. is a 73 y.o. male who is recently diagnosed with melanoma of the left arm s/p wide excision and sentinel biopsy is here for evaluation and management at the request of Dr. Erroll Luna. Patient reports going to dermatology (Dr. Elvera Lennox) about 3-4 months ago because he has a recurrent rash of his chest.  He went to Dr. Juanna Cao office for cream to apply for the rash and while there he showed a spot on left arm.  It was size of pea.  Dr. Elvera Lennox did the biopsy in February, 06, 2015 and it was demonstrated to be a melanoma. Specifically, It demonstrated shave biopsy left antecubital area, superificial spreading, arising in an nevus, 0.83 MM to base/ deep margin, IV anatomic level absent of ulceration, peripheral margins were involved and deep margin involved, with a mitotic index of greater than 1 per MM2, absent of LVI, non-brisk tumor-infiltrating lymphocytes, absent tumor regression, pathologic stage [id number].   He has had carcinomas beneath the eye with removal.  He has had a history of 2 Moh's surgery (due to basal cell carcinoma around 5 years ago) but was determined not to be a candidate for surgery based on above.  He was admitted to [facility] on 03/17 for surgery.  He had left arm skin biopsy with lymph node, sentinel biopsy of left axillary.  There were no intranodal metastatic melanoma tumor deposits identified on routine histology or with Melan-A and HMB-45 immuno stains.    He has recovered without difficulty.  He did require an antibiotic for 5 -7 days due cellulitis.  He saw Dr. Brantley Stage and he wanted him to be evaluated by oncology because a few of  his patients with Stage I has had recurrence.  He denies any hospitalizations or emergency room visits.  He lives in [place] about 30 miles from [place].  He denies any family history of cancers. He has had a colonoscopy five years ago by Dr. Percell Miller.  He will return some time this year.   He had multiple polys removed.  He denies weight lost.  He reports that the surgery area on his left arm has nearly completely healed and will start applying McDerma skin cream.  He had a major prostate surgery about 9 years ago and since his ambulation has changed resulting in cautious gait requiring a walker.  He denies recent falls.  His last fall was in July 2014.  Wife reports he blacked out.   He was admitted to [facility] without etiology defined, per her report.  He was instructed not to drive for six months.  This happened again in January 2015.  He fell and cracked 3 ribs and had emesis.  He does walking therapy with [facility] neurology.  He was on aricept and they recommended against this based on his recurrent fainting spells.  He has been off aricept since February, 2015.  He has had new hearing aides.      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     PAST SURGICAL HISTORY: Past Surgical History  Procedure Laterality Date  . Suprapubic prostatectomy  02/21/2008  . Basal cell carcinoma excision Left 2000's    points to nasal fold  (02/25/2013)  . Colonoscopy    . Melanoma excision with sentinel lymph node biopsy Left 10/25/2013    Procedure: WIDE EXCISION MELANOMA LEFT ARM WITH LEFT AXILLARY  SENTINEL LYMPH NODE BIOPSY;   Surgeon: Joyice Faster. Cornett, MD;  Location: [facility];  Service: General;  Laterality: Left;     CURRENT MEDICATIONS: has a current medication list which includes the following prescription(s): acetaminophen, carbidopa-levodopa, cyclobenzaprine, mesalamine, pantoprazole, pravastatin, triamcinolone cream, valsartan-hydrochlorothiazide, and vitamin b-12.   ALLERGIES: Lexapro and Tetracyclines & related   SOCIAL HISTORY:  reports that he has never smoked. He has never used smokeless tobacco. He reports that he drinks alcohol. He reports that he does not use illicit drugs.   FAMILY HISTORY: family history includes Dementia in his mother; Heart disease in his father; Heart failure in his father.   LABORATORY DATA:  CBC    Component Value Date/Time   WBC 5.7 10/21/2013 1046   RBC 4.68 10/21/2013 1046   HGB 14.7 10/21/2013 1046   HCT 42.0 10/21/2013 1046   PLT 215 10/21/2013 1046   MCV 89.7 10/21/2013 1046   MCH 31.4 10/21/2013 1046   MCHC 35.0 10/21/2013 1046   RDW 13.1 10/21/2013 1046   LYMPHSABS 1.4 10/21/2013 1046   MONOABS 0.6 10/21/2013 1046   EOSABS 0.1 10/21/2013 1046   BASOSABS 0.1 10/21/2013 1046    CMP     Component Value Date/Time   NA 136* 10/21/2013 1046   K 4.7 10/21/2013 1046   CL 97 10/21/2013 1046   CO2 26 10/21/2013 1046   GLUCOSE 86 10/21/2013 1046   BUN 11 10/21/2013 1046   CREATININE 0.61 10/21/2013 1046   CALCIUM 9.5 10/21/2013 1046   PROT 6.9 10/21/2013 1046   ALBUMIN 3.5 10/21/2013 1046   AST 22 10/21/2013 1046   ALT 8 10/21/2013 1046   ALKPHOS 110 10/21/2013 1046   BILITOT 0.3 10/21/2013 1046   GFRNONAA >90 10/21/2013 1046   GFRAA >90 10/21/2013 1046     RADIOGRAPHY: 10/17/2013 NUCLEAR MEDICINE LYMPHANGIOGRAPHY  TECHNIQUE:  Sequential images were obtained following intradermal injection of  radiopharmaceutical at the tumor site in the left forearm.  COMPARISON: None  RADIOPHARMACEUTICALS: 0.55 MILLI CURIE NYCOMED-[employer]D[place]ott TECHNETIUM TC 23M  SULFUR COLLOID FILTERED. Additional  0.3 mCi was injected due to 9 movement of the initial injection. FINDINGS: Imaging 2 hr after the initial injection showed no motion of the tracer from the injection site at the left forearm. Following reinjection, an additional 2 hr of delay was made. Repeat imaging after this second 2 hr delay demonstrated a single focus of tracer accumulation in the left axilla.  No additional sites of tracer accumulation are identified. IMPRESSION: Single focus of tracer accumulation in the left axilla likely representing a left axillary lymph node.   REVIEW OF SYSTEMS:  Constitutional: Denies fevers, chills or abnormal weight loss Eyes: Denies blurriness of vision Ears, nose, mouth, throat, and face: Denies mucositis or sore throat Respiratory: Denies cough, dyspnea or wheezes Cardiovascular: Denies palpitation, chest discomfort or lower extremity swelling Gastrointestinal:  Denies nausea, heartburn or change in bowel habits Skin: Denies abnormal skin rashes Lymphatics: Denies new lymphadenopathy or easy bruising Neurological:Denies numbness, tingling or new weaknesses Behavioral/Psych: Mood is stable,  no new changes  All other systems were reviewed with the patient and are negative.  PHYSICAL EXAM:  height is 5\' 6"  (1.676 m) and weight is 161 lb 9.6 oz (73.301 kg). His oral temperature is 97.6 F (36.4 C). His blood pressure is 156/79 and his pulse is 64. His respiration is 17 and oxygen saturation is 100%.    GENERAL:alert, no distress and comfortable; elderly male with hearing aides bilaterally; ambulates with a walker.  SKIN: skin color, texture, turgor are normal, no rashes; Scar on left antecubital area well healed; scar underneath L axillary area.  EYES: normal, Conjunctiva are pink and non-injected, sclera clear OROPHARYNX:no exudate, no erythema and lips, buccal mucosa, and tongue normal  NECK: supple, thyroid normal size, non-tender, without nodularity LYMPH:  no palpable lymphadenopathy in the  cervical, axillary or inguinal LUNGS: clear to auscultation and percussion with normal breathing effort HEART: regular rate & rhythm and no murmurs and no lower extremity edema ABDOMEN:abdomen soft, non-tender and normal bowel sounds Musculoskeletal:no cyanosis of digits and no clubbing  NEURO: alert & oriented x 3 with fluent speech, no focal motor/sensory deficits; cautious, shuffling gait   PATHOLOGY: Diagnosis 1. Lymph node, sentinel, biopsy, Left axillary - ONE LYMPH NODE, NEGATIVE FOR TUMOR (0/1) SEE COMMENT. 2. Skin , Left arm - NEGATIVE FOR MALIGNANT MELANOMA, SEE COMMENT. - PREVIOUS BIOPSY SITE IDENTIFIED. - SURGICAL MARGINS, NEGATIVE FOR TUMOR. Microscopic Comment 1. There are no intranodal metastatic melanoma tumor deposits identified on routine histology or with Melan-A and HMB-45 immunostains. 2. The previous biopsy demonstrating pT1b malignant melanoma is noted ([id number]). The current excision demonstrates previous biopsy site without the presence of residual malignant melanoma. Additional non-neoplastic findings include extensive solar elastosis and actinic keratosis. Please see the previous biopsy for the melanoma pathologic features. The surgical resection margin(s) of the specimen were inked and microscopically evaluated. (CR:ecj 10/26/2013) Mali RUND DO Pathologist, Electronic Signature (Case signed 10/27/2013)  IMPRESSION: Lynnwood Beckford. is a 73 y.o. male with a history of    PLAN:  1.  Melanoma of L arm, Stage IB (0.76-1.0 mm thick with mitotic rate greater than 1 per mm^2). --We reviewed the pathology and imaging and labs with the patient and his consistent with the above diagnosis.  He is s/p sentinel node biopsy and wide excision with sentinel node negative.  Based on current NCCN Guidelines Version 3.2015, we recommend based on if stage IB, observation versus clinical trial.  Our institution do not presently have clinical trials in this context.  Furthermore, the patient  declines clinical trial enrollment and referral for consideration.  I would agree given his relative co-morbidities making him less likely to be a candidate presently.  We discussed the risks and benefits in depth, and understanding these including small chance of recurrence disease, he chooses observation.   Since the patient does not have Stage IIB or IIC, he is not a candidate for interferon alfa (category B).   --We then discussed continued follow up every 6 months to one year with history and physical focused on physical exam.  Initially, he will follow up in 3 months for a symptom check visit.  He has a follow up with Dr. Josetta Huddle office in the next few weeks.   All questions were answered. The patient knows to call the clinic with any problems, questions or concerns. We can certainly see the patient much sooner if necessary.  I spent 25 minutes counseling the patient face to face. The total time spent in  the appointment was 45 minutes.    Concha Norway, MD 12/06/2013 5:16 PM

## 2013-12-06 NOTE — Patient Instructions (Signed)
Melanoma Melanoma is the least common, but most dangerous, form of skin cancer. This is because it can spread (metastasize) to other organs and can be life-threatening. Melanoma is a cancerous (malignant) tumor that begins in a certain type of cells, called melanocytes. Melanocytes are the cells that produce the color (pigment) called melanin. Melanin colors our skin, hair, eyes, and moles. CAUSES  The exact cause of melanoma is unknown. You may have a higher risk if you:  Spend or have spent a lot of time in the sun. This includes sunlamp and tanning booth exposure.  Have had sunburns. This put you at a particularly increased risk for melanoma. The more blistering sunburns a person has, the higher the risk.  Spend time in parts of the world with more intense sunlight.  Have fair skin that does not tan easily. You may have a lower risk if you have a darker skin color. However, people with darker skin can get melanoma, especially on the hands and feet (acral areas).  Have a close relative (parent, sibling) who has melanoma.  Have a large number of skin moles (more than 100). SYMPTOMS  A skin mole is suspicious if it has any of these 5 traits. This is called the ABCDE's of melanoma:  Asymmetry: Irregular shape, not simply round or oval.  Border: Edge of the mole is irregular, not smooth.  Color: Mole may have multiple colors in it, including brown, black, blue, red, or tan.  Diameter: More than 0.2 inches (6 mm) across.  Evolving: Any unusual change or symptoms in the mole, such as pain, itching, stinging, sensitivity, or bleeding. A mole that is noticeably changing in appearance, or any new mole, should be checked for melanoma. In general, people develop new moles until age 76. New moles after this age should be brought to the attention of your caregiver. DIAGNOSIS  Your caregiver can look at your skin and find lesions or moles that may be suspicious. A patient may also notice a mole  with symptoms or a mole that does not look like most of the other moles on his or her body. This is called the "ugly duckling" sign. A tissue sample (biopsy) examined under a microscope is needed to determine if it is melanoma. The size and extent of the biopsy will depend on the location, size, and appearance of the skin lesion or mole. The biopsy can also reveal whether melanoma has spread to deeper layers of the skin. TREATMENT  Surgery to completely remove the melanoma is required. Lymph nodes may also be removed. If the melanoma has spread to other organs, such as the liver, lungs, bone, or brain, cancer-fighting drugs (chemotherapy) must be used. Your caregiver will discuss your treatment options with you. You can ask about being included in a clinical trial to evaluate new forms of treatment. Melanoma can occasionally recur years after the initial diagnosis. If you have melanoma, you will need follow-up visits with your caregiver for many years. PREVENTION  Risk for melanoma can be reduced by minimizing sun exposure. Practice the 3 S's:  Slip on a shirt.  Slop on sunscreen.  Slap on a hat. Do not spend time in the sun during peak midafternoon hours. Sunscreen/sunblock with SPF 30 or higher and UVA/UVB block should be applied regularly. You should do this even during brief exposure to sunlight. You should also do this on cloudy days and in winter, even though the perceived sunlight is less. Always avoid sunburn! Wear sunglasses that block UV  light. Be sure to see your caregiver if you have any new or changing moles. HOME CARE INSTRUCTIONS   Follow wound care instructions after surgical removal of your melanoma.  Practice good sun avoidance and protective measures as described above.  Let your close family members (parents, children, siblings) know about your diagnosis. This puts them at a higher risk of getting melanoma than the general population. SEEK MEDICAL CARE IF:   You notice any  new moles, or you have any moles that are changing.  You have had a melanoma removed and you notice a new growth near the same location.  You have had a melanoma removed and you experience any new or unexplained health problems. Document Released: 07/28/2005 Document Revised: 10/20/2011 Document Reviewed: 11/16/2009 [facility] Patient Information 2014 [place], [place].

## 2013-12-06 NOTE — Progress Notes (Signed)
Checked in new patient with no financial issues. He has appt card and has not been out of the country,

## 2013-12-23 ENCOUNTER — Telehealth: Payer: Self-pay | Admitting: Neurology

## 2013-12-23 NOTE — Telephone Encounter (Signed)
Called patient about rescheduling 01/10/14 appointment with Dr. Rexene Alberts per her schedule, but patient did not want to see NP. Patient's wife states that patient was screened for physical therapy next door and the PT states that he needs to be seen for PT again and sent [place] a request but they haven't heard anything from [place] yet. Please call and advise.

## 2013-12-27 NOTE — Telephone Encounter (Signed)
Pt's wife was called to resch appt from 01/10/14 and because pt did not want to see NP, pt was schedule for 05/23/14. Pt's last OV was 08/19/13, pt is a prior Dr. Erling Cruz pt and Dr. Erling Cruz referred pt to Physical Therapy on 02/06/12 and pt states that he needs to be seen again for PT. Please advise

## 2013-12-27 NOTE — Telephone Encounter (Signed)
He can be seen in a FU slot with me, next available, or with NP.

## 2013-12-28 NOTE — Telephone Encounter (Signed)
Called pt and spoke with pt's wife Edmonia Lynch to make an appt for the pt on 01/04/14 with Dr. Rexene Alberts and that Dr. Rexene Alberts stated that at that time that they would discuss Physical Therapy. I advised the wife that if the pt has any other problems, questions or concerns to call the office. Pt verbalized understanding.

## 2014-01-04 ENCOUNTER — Ambulatory Visit (INDEPENDENT_AMBULATORY_CARE_PROVIDER_SITE_OTHER): Payer: Medicare Other | Admitting: Neurology

## 2014-01-04 ENCOUNTER — Encounter: Payer: Self-pay | Admitting: Neurology

## 2014-01-04 VITALS — BP 147/81 | HR 63 | Temp 96.3°F | Ht 66.0 in | Wt 162.0 lb

## 2014-01-04 DIAGNOSIS — C439 Malignant melanoma of skin, unspecified: Secondary | ICD-10-CM

## 2014-01-04 DIAGNOSIS — G609 Hereditary and idiopathic neuropathy, unspecified: Secondary | ICD-10-CM

## 2014-01-04 DIAGNOSIS — R55 Syncope and collapse: Secondary | ICD-10-CM

## 2014-01-04 DIAGNOSIS — R9402 Abnormal brain scan: Secondary | ICD-10-CM

## 2014-01-04 DIAGNOSIS — G20 Parkinson's disease: Secondary | ICD-10-CM

## 2014-01-04 DIAGNOSIS — R269 Unspecified abnormalities of gait and mobility: Secondary | ICD-10-CM

## 2014-01-04 DIAGNOSIS — R9409 Abnormal results of other function studies of central nervous system: Secondary | ICD-10-CM

## 2014-01-04 DIAGNOSIS — G911 Obstructive hydrocephalus: Secondary | ICD-10-CM

## 2014-01-04 DIAGNOSIS — G20A1 Parkinson's disease without dyskinesia, without mention of fluctuations: Secondary | ICD-10-CM

## 2014-01-04 NOTE — Progress Notes (Signed)
Subjective:    [name] ID: Gregory [name]. is a 73 y.o. male.  HPI     Interim history:  Gregory [name] is a 73 year old gentleman with an underlying medical history of paroxysmal vertigo, hypertension, hyperlipidemia, obstructive hydrocephalus, history of syncope, memory loss, cervical spine degenerative disease, and melanoma of Gregory skin, who presents for followup consultation of Gregory [name] parkinsonism and gait disorder. Gregory [name] is accompanied by Gregory [name] wife again today. I last saw Gregory [name] on 08/29/2013, at which time I felt Gregory [name] gait disorder was multifactorial in nature. I felt Gregory [name] had some parkinsonism but no idiopathic Parkinson's disease. Gregory [name] had a recent head CT which showed stable findings. Gregory [name] wife was wondering if Gregory [name] needed a shunt place. I offered a referral to neurosurgery but they decline. We talked about potentially doing a DaT scan but Gregory [name] indicated that Gregory [name] would like to think about it. I provided Gregory [name] with information material Gregory scan. Gregory [name] was in Gregory process of tapering off Aricept. Gregory [name] had a syncopal spell and fell with rib fractures sustained. Gregory [name] felt that Gregory [name] had benefited from levodopa therapy and therefore I kept Gregory [name] on generic Sinemet. In Gregory interim, we had to reschedule Gregory [name] June appointment. We offered Gregory [name] a sooner appointment with our nurse practitioner which Gregory [name] declined, as Gregory [name] did not wish to see a nurse practitioner.   Today, Gregory [name] reports no recent syncopal spell since January 2015. Gregory [name] is not driving. Gregory [name] had Gregory [name] last PT in 04/13/13 to 06/09/13 with good results, per D/C summary from [name], PT. Gregory [name] felt improved. Gregory [name] has new hearing aids, but Gregory [name] one is not working and Gregory [name] will see Gregory [name] audiologist this week. Gregory [name] has eye doctor for this cataracts. Gregory [name] walks with a walker, but was able to get to a point where Gregory [name] was only using a cane after Gregory [name] last physical therapy session. Gregory [name] has always felt that Gregory Sinemet was helpful for Gregory [name] walking. Gregory [name] understands that Gregory [name] does not have idiopathic Parkinson's disease. Gregory [name] sees Gregory [name]  dermatologist every 3 months. Gregory [name] will also continue to see Gregory [name] surgeon regularly. Gregory [name] had one lymph node removed in Gregory context of Gregory [name] melanoma surgery and this was from Gregory left axillary area and it was negative per [name]'s report.  I saw Gregory [name] on 03/03/2013 after a recent hospitalization for syncope. Gregory [name] had seen by cardiology and also saw Gregory [name] at [facility] neurology. I reviewed notes from Gregory [name] as well as Gregory [name] in cardiology. Gregory [name] saw Gregory [name] on 04/04/13 and felt, Gregory [name] had some parkinsonism but not idiopathic Parkinson's disease. She noted signs of peripheral neuropathy and added further labs. She referred Gregory [name] for speech therapy. Gregory [name] blood work showed a normal IFE, normal RPR, normal folate level, and a low normal B12 level and she recommended starting B12 vitamin. Gregory [name] saw Gregory [name] on 03/14/2013 who stopped Gregory [name] beta blocker and did not find any Cardiologic etiology of Gregory [name] syncope. Gregory [name] was hospitalized from 08/12/2013 through 08/15/2013 after a syncopal event resulting in a fall during which Gregory [name] sustained rib fractures. Gregory [name] was kept on Sinemet. Gregory [name] has not had any falls or syncopal since then. Gregory [name] was also advised that Gregory [name] syncope may come from Aricept and Gregory [name] was on 1/2 pill qHS for one week, then 1/2 pill qod for one week. Gregory [name] had a CT chest on 08/12/13 and a CXR on 08/13/13, which I reviewed Gregory reports of. Gregory [name] had a Grant on 08/12/13, which I reviewed through Gregory PACS system. I also  reviewed Gregory report: Chronic noncommunicating hydrocephalus. No acute intracranial findings.  Gregory [name] was evaluated for hearing loss and was supposed to get hearing aids. Gregory [name] had an appointment for cognitive testing, but Gregory [name] wanted to delay it till after Gregory [name] hearing aids. Gregory [name] finished speech therapy.   I first met Gregory [name] on 02/04/2013 with a history of parkinsonism and gait disorder. Gregory [name] previously followed with Gregory [name] and was last seen by Gregory [name] on 10/05/2012, at which time Gregory [name] felt that Gregory [name] gait was improved on levodopa therapy.  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 Gregory [name] started having difficulty with Gregory [name] walking after prostate surgery in 2009. 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 Gregory [name] had an EEG in December 2011 showing transient slowing in Gregory right central temporal region. Gregory [name] has had memory loss. 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 Since Gregory [name] had done fairly well on levodopa therapy, and I kept Gregory [name] on Gregory same dose. Gregory [name] was admitted to [facility] on 02/25/2013 and discharged on 02/27/2013 due to syncope and collapse. Gregory [name] has had 2 similar episodes in Gregory past. Gregory [name] was admitted with telemetry monitoring and it showed no dysrhythmias. EEG was normal in Gregory awake and drowsy state. Gregory [name] carotid Doppler study showed mild to moderate soft plaque in Gregory common carotid artery and origin of Gregory internal carotid artery on Gregory right and minimal plaque on Gregory left. Vertebral artery flow was antegrade. Cardiac echo showed mild AS and trivial aortic and tricuspid regurgitation as well as mitral regurgitation. Ejection fraction was preserved at 55%. No wall motion abnormalities were seen. Gregory [name] had serial cardiac enzymes which were negative. Gregory [name] was started on Lopressor. Head CT from 02/25/2013 showed no acute intracranial abnormalities, brain atrophy, similar appearance of chronic hydrocephalus.   Gregory [name] Past Medical History Is Significant For: Past Medical History  Diagnosis Date  . Benign paroxysmal positional vertigo   . Obstructive hydrocephalus   . Abnormality of gait     takes Carbidopa-Levodopa  . Basal cell carcinoma     "primarily on my face; I've had a bunch" (02/25/2013)  . 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 Ulcerative colitis     takes Apriso daily  . Unspecified essential hypertension     takes Diovan daily   . Hyperlipidemia     is on med but unsure of name-to bring day of surgery  . Asthma     was on allergy shots and no problems 4-38yr ago  . Arthritis     left knee  . Joint swelling     left knee  . Reflux     takes Protonix daily  . History of colon polyps   . History of blood transfusion     no abnormal reaction noted  . Cataracts, bilateral     immature  . Complication of anesthesia     "I have aqueductal stenosis" (02/25/2013); concerns re: general anesthesia b/c Gregory [name] didn't devleop gait disturbance until Gregory post-operative period following prostectomy in 2009    Gregory [name] Past Surgical History Is Significant For: Past Surgical History  Procedure Laterality Date  . Suprapubic prostatectomy  02/21/2008  . Basal cell carcinoma excision Left 2000's    points to nasal fold  (02/25/2013)  . Colonoscopy    .  Melanoma excision with sentinel lymph node biopsy Left 10/25/2013    Procedure: WIDE EXCISION MELANOMA LEFT ARM WITH LEFT AXILLARY  SENTINEL LYMPH NODE BIOPSY;  Surgeon: Joyice Faster. Cornett, MD;  Location: [facility];  Service: General;  Laterality: Left;    Gregory [name] Family History Is Significant For: Family History  Problem Relation Age of Onset  . Heart failure Father   . Heart disease Father   . Dementia Mother     Gregory [name] Social History Is Significant For: History   Social History  . Marital Status: Married    Spouse Name: N/A    Number of Children: N/A  . Years of Education: N/A   Social History Main Topics  . Smoking status: Never Smoker   . Smokeless tobacco: Never Used     Comment: 02/25/2013 "quit smoking > 20 yr ago"  . Alcohol Use: Yes     Comment: glass of wine occasionally  . Drug Use: No  . Sexual Activity: Yes   Other Topics Concern  . None   Social History Narrative  . None    Gregory [name] Allergies Are:  Allergies  Allergen Reactions  . Lexapro [Escitalopram Oxalate] Other (See Comments)    Unknown: reaction occurred some time ago and pt cannot remember what it was   . Tetracyclines & Related Other (See Comments)    Unknown: Reaction occurred some time ago and pt cannot remember what it was   :   Gregory [name] Current Medications Are:  Outpatient Encounter Prescriptions as of 01/04/2014  Medication Sig  . acetaminophen (TYLENOL ARTHRITIS PAIN) 650 MG CR tablet Take 650 mg by mouth daily.  . carbidopa-levodopa (SINEMET IR) 25-100 MG per tablet Take 1 tablet by mouth 3 (three) times daily. Take one tablet in Gregory morning, one tablet at 3 pm, and one tablet at bedtime.  . cyclobenzaprine (FLEXERIL) 10 MG tablet Take 5 mg by mouth every 3 (three) days.   . mesalamine (APRISO) 0.375 G 24 hr capsule Take 375 mg by mouth every morning. Each morning  . pantoprazole (PROTONIX) 40 MG tablet Take 40 mg by mouth 2 (two) times daily.  . pravastatin (PRAVACHOL) 20 MG tablet Take 20 mg by mouth daily.  Marland Kitchen triamcinolone cream (KENALOG) 0.1 % Apply 1 application topically 2 (two) times daily.  . valsartan (DIOVAN) 160 MG tablet Take 80 mg by mouth every other day.  . vitamin B-12 (CYANOCOBALAMIN) 1000 MCG tablet Take 1,000 mcg by mouth every morning.  . [DISCONTINUED] valsartan-hydrochlorothiazide (DIOVAN-HCT) 80-12.5 MG per tablet Take 1 tablet by mouth every other day.  :  Review of Systems:  Out of a complete 14 point review of systems, all are reviewed and negative with Gregory exception of these symptoms as listed below:  Review of Systems  Constitutional: Negative.   HENT: Negative.   Eyes: Negative.   Respiratory: Negative.   Cardiovascular: Negative.   Gastrointestinal: Negative.   Endocrine: Negative.   Genitourinary: Negative.   Musculoskeletal: Positive for gait problem.  Skin: Negative.   Allergic/Immunologic: Negative.   Neurological: Negative.   Hematological: Negative.   Psychiatric/Behavioral: Negative.     Objective:  Neurologic Exam  Physical Exam Physical Examination:   Filed Vitals:   01/04/14 1210  BP: 147/81  Pulse: 63  Temp: 96.3 F (35.7  C)   General Examination: Gregory [name] is a very pleasant 73 y.o. male in no acute distress. Gregory [name] denies lightheadedness upon standing.  HEENT: Macrocephalic, atraumatic, pupils are equal, round and reactive to light and  accommodation. Extraocular tracking shows mild saccadic breakdown without nystagmus noted. There is no limitation to gaze. There is no decrease in eye blink rate. Hearing seems improved since Gregory [name] hearing aids. Gregory [name] has bilateral hearing aids in place bilaterally, but reports Gregory left one is not working. Face is symmetric with no facial masking and normal facial sensation. There is no lip, neck or jaw tremor. Neck is not rigid with intact passive ROM. There are no carotid bruits on auscultation. Oropharynx exam reveals mild mouth dryness. No significant airway crowding is noted. Mallampati is class II. Tongue protrudes centrally and palate elevates symmetrically. There is no drooling.   Chest: is clear to auscultation without wheezing, rhonchi or crackles noted.  Heart: sounds are regular and normal without murmurs, rubs or gallops noted.   Abdomen: is soft, non-tender and non-distended with normal bowel sounds appreciated on auscultation.  Extremities: There is no pitting edema in Gregory distal lower extremities bilaterally. Pedal pulses are intact.   Skin: is warm and dry with no trophic changes noted. Age-related changes are noted on Gregory skin.   Musculoskeletal: exam reveals: Left knee swelling and genu varus on Gregory left. Gregory [name] has a well-healing scar in Gregory left antecubital fossa area.  Neurologically:  Mental status: Gregory [name] is awake and alert, paying good  attention. Gregory [name] is able to completely provide Gregory history. Gregory [name] wife provides additional details. Gregory [name] is oriented to: person, place, time/date, situation, day of week, month of year and year. Gregory [name] memory, attention, language and knowledge are fairly well intact. There is no aphasia, agnosia, apraxia or anomia. There is a no degree of  bradyphrenia. Speech is mildly hypophonic with no dysarthria noted. Mood is congruent and affect is normal.   Cranial nerves are as described above under HEENT exam. In addition, shoulder shrug is normal with equal shoulder height noted.  Motor exam: Normal bulk, and strength for age is noted. There are no dyskinesias noted. Tone is not rigid with absence of cogwheeling in Gregory extremities. There is overall no bradykinesia. There is no drift or rebound.  There is no tremor.   Romberg is negative, but Gregory [name] has to stand wide-based.  Reflexes are 1+ in Gregory upper extremities and 1+ in Gregory lower extremities. Fine motor skills exam: Fine motor skills are fairly well preserved in Gregory upper extremities and mildly impaired in Gregory lower extremities. Cerebellar testing shows no dysmetria or intention tremor on finger to nose testing. Heel to shin is unremarkable bilaterally. There is no truncal or gait ataxia.   Sensory exam is intact to light touch in Gregory upper and lower extremities.   Gait, station and balance: Gregory [name] stands up with mild difficulty and has to push himself up. Gregory [name] stands mildly wide-based. Gregory [name] is bowlegged especially in Gregory left. Gregory [name] has no significant start hesitation or freezing. Gregory [name] walks with Gregory [name] 2 wheeled walker and maneuvers it well. Gregory [name] balance is mildly impaired. Gregory [name] does not have a typical parkinsonian shuffle. Gregory [name] posture is mildly stooped. Gregory [name] turns in 3-4 steps. Gregory [name] does have slight difficulty turning. Gregory [name] walks slowly.  Assessment and Plan:   In summary, Gregory [name] is a very pleasant 73 year old male with a history of hydrocephalus which is likely congenital and gait disorder which is probably multifactorial with mild evidence of parkinsonism. Gregory [name] has had no recent syncopal spells. Gregory [name] was taken off of Aricept. Gregory [name] exam has remained stable. Gregory [name] has some parkinsonism but no frank signs and symptoms of Parkinson's disease. Gregory [name] decided  not to pursue Gregory DaT scan. Gregory [name] had a recent diagnosis of  new onset melanoma and had surgical removal from Gregory [name] left arm for this. Gregory [name] is healing well. I did advise Gregory [name] that there may be a remote connection between melanoma recurrence or melanoma occurrence and levodopa therapy. Gregory [name] is not willing to come off of levodopa at this time and as long as Gregory [name] is aware that there may be a slightly higher risk of melanoma development while being on Sinemet and as long as Gregory [name] has regular checkups with Gregory [name] dermatologist we mutually agreed to keep Gregory [name] on Sinemet. Gregory [name] is not willing to consider coming off of Sinemet at this time because Gregory [name] had improvement in Gregory [name] walking and this is very important to Gregory [name] to be able to walk and be functional. Gregory [name] is advised to discuss this also with Gregory [name] dermatologist. We will certainly not make an increase in Gregory levodopa but perhaps consider reducing it, but Gregory [name] is currently not keen on reducing it just yet. Gregory [name] is willing to discuss this with Gregory [name] dermatologist. Since Gregory [name] did well last year with physical therapy I would like for Gregory [name] to have another round of physical therapy and requested Gregory same physical therapist, [name] as Gregory [name] worked well with her. I placed a referral in that regard. I will see Gregory [name] back routinely in about 4 or 5 months. Gregory [name] and Gregory [name] wife were in agreement.

## 2014-01-04 NOTE — Patient Instructions (Signed)
We will continue with Sinemet as long as you have close follow-up with your dermatologist and your cancer doctor. You may want to discuss with your dermatologist the fact that you are on Sinemet and what his take is on staying on it with your new diagnosis of melanoma.   We will refer you back to physical therapy with [facility].   Follow up in 6 months.

## 2014-01-10 ENCOUNTER — Ambulatory Visit: Payer: Medicare Other | Admitting: Neurology

## 2014-01-12 ENCOUNTER — Ambulatory Visit: Payer: Medicare Other | Attending: Neurology | Admitting: Physical Therapy

## 2014-01-12 DIAGNOSIS — R269 Unspecified abnormalities of gait and mobility: Secondary | ICD-10-CM | POA: Diagnosis not present

## 2014-01-12 DIAGNOSIS — IMO0001 Reserved for inherently not codable concepts without codable children: Secondary | ICD-10-CM | POA: Insufficient documentation

## 2014-01-12 DIAGNOSIS — R55 Syncope and collapse: Secondary | ICD-10-CM | POA: Insufficient documentation

## 2014-01-12 DIAGNOSIS — G20 Parkinson's disease: Secondary | ICD-10-CM | POA: Insufficient documentation

## 2014-01-12 DIAGNOSIS — G911 Obstructive hydrocephalus: Secondary | ICD-10-CM | POA: Diagnosis not present

## 2014-01-12 DIAGNOSIS — G20A1 Parkinson's disease without dyskinesia, without mention of fluctuations: Secondary | ICD-10-CM | POA: Insufficient documentation

## 2014-01-31 ENCOUNTER — Ambulatory Visit: Payer: Medicare Other | Admitting: Physical Therapy

## 2014-01-31 DIAGNOSIS — IMO0001 Reserved for inherently not codable concepts without codable children: Secondary | ICD-10-CM | POA: Diagnosis not present

## 2014-02-02 ENCOUNTER — Ambulatory Visit: Payer: Medicare Other | Admitting: Physical Therapy

## 2014-02-02 DIAGNOSIS — IMO0001 Reserved for inherently not codable concepts without codable children: Secondary | ICD-10-CM | POA: Diagnosis not present

## 2014-02-06 ENCOUNTER — Ambulatory Visit: Payer: Medicare Other | Admitting: Physical Therapy

## 2014-02-06 DIAGNOSIS — IMO0001 Reserved for inherently not codable concepts without codable children: Secondary | ICD-10-CM | POA: Diagnosis not present

## 2014-02-08 ENCOUNTER — Ambulatory Visit: Payer: Medicare Other | Attending: Neurology | Admitting: Physical Therapy

## 2014-02-08 DIAGNOSIS — G911 Obstructive hydrocephalus: Secondary | ICD-10-CM | POA: Insufficient documentation

## 2014-02-08 DIAGNOSIS — R269 Unspecified abnormalities of gait and mobility: Secondary | ICD-10-CM | POA: Diagnosis not present

## 2014-02-08 DIAGNOSIS — G20A1 Parkinson's disease without dyskinesia, without mention of fluctuations: Secondary | ICD-10-CM | POA: Insufficient documentation

## 2014-02-08 DIAGNOSIS — IMO0001 Reserved for inherently not codable concepts without codable children: Secondary | ICD-10-CM | POA: Diagnosis not present

## 2014-02-08 DIAGNOSIS — G20 Parkinson's disease: Secondary | ICD-10-CM | POA: Diagnosis not present

## 2014-02-08 DIAGNOSIS — R55 Syncope and collapse: Secondary | ICD-10-CM | POA: Diagnosis not present

## 2014-02-13 ENCOUNTER — Ambulatory Visit: Payer: Medicare Other | Admitting: Physical Therapy

## 2014-02-13 DIAGNOSIS — IMO0001 Reserved for inherently not codable concepts without codable children: Secondary | ICD-10-CM | POA: Diagnosis not present

## 2014-02-15 ENCOUNTER — Ambulatory Visit: Payer: Medicare Other | Admitting: Physical Therapy

## 2014-02-15 DIAGNOSIS — IMO0001 Reserved for inherently not codable concepts without codable children: Secondary | ICD-10-CM | POA: Diagnosis not present

## 2014-02-20 ENCOUNTER — Ambulatory Visit: Payer: Medicare Other | Admitting: Physical Therapy

## 2014-02-20 DIAGNOSIS — IMO0001 Reserved for inherently not codable concepts without codable children: Secondary | ICD-10-CM | POA: Diagnosis not present

## 2014-02-22 ENCOUNTER — Ambulatory Visit: Payer: Medicare Other | Admitting: Physical Therapy

## 2014-02-22 DIAGNOSIS — IMO0001 Reserved for inherently not codable concepts without codable children: Secondary | ICD-10-CM | POA: Diagnosis not present

## 2014-02-27 ENCOUNTER — Ambulatory Visit: Payer: Medicare Other | Admitting: Physical Therapy

## 2014-02-27 DIAGNOSIS — IMO0001 Reserved for inherently not codable concepts without codable children: Secondary | ICD-10-CM | POA: Diagnosis not present

## 2014-03-01 ENCOUNTER — Ambulatory Visit: Payer: Medicare Other | Admitting: Physical Therapy

## 2014-03-01 DIAGNOSIS — IMO0001 Reserved for inherently not codable concepts without codable children: Secondary | ICD-10-CM | POA: Diagnosis not present

## 2014-03-06 ENCOUNTER — Ambulatory Visit: Payer: Medicare Other | Admitting: Physical Therapy

## 2014-03-06 DIAGNOSIS — IMO0001 Reserved for inherently not codable concepts without codable children: Secondary | ICD-10-CM | POA: Diagnosis not present

## 2014-03-07 ENCOUNTER — Other Ambulatory Visit (HOSPITAL_BASED_OUTPATIENT_CLINIC_OR_DEPARTMENT_OTHER): Payer: Medicare Other

## 2014-03-07 ENCOUNTER — Telehealth: Payer: Self-pay | Admitting: Internal Medicine

## 2014-03-07 ENCOUNTER — Encounter: Payer: Self-pay | Admitting: Internal Medicine

## 2014-03-07 ENCOUNTER — Ambulatory Visit (HOSPITAL_BASED_OUTPATIENT_CLINIC_OR_DEPARTMENT_OTHER): Payer: Medicare Other | Admitting: Internal Medicine

## 2014-03-07 VITALS — BP 162/77 | HR 67 | Temp 98.1°F | Resp 18 | Ht 66.0 in | Wt 163.1 lb

## 2014-03-07 DIAGNOSIS — C4360 Malignant melanoma of unspecified upper limb, including shoulder: Secondary | ICD-10-CM

## 2014-03-07 DIAGNOSIS — C439 Malignant melanoma of skin, unspecified: Secondary | ICD-10-CM

## 2014-03-07 LAB — CBC WITH DIFFERENTIAL/PLATELET
BASO%: 1 % (ref 0.0–2.0)
Basophils Absolute: 0.1 10*3/uL (ref 0.0–0.1)
EOS%: 3 % (ref 0.0–7.0)
Eosinophils Absolute: 0.2 10*3/uL (ref 0.0–0.5)
HCT: 46.3 % (ref 38.4–49.9)
HGB: 15.1 g/dL (ref 13.0–17.1)
LYMPH%: 27.2 % (ref 14.0–49.0)
MCH: 30.2 pg (ref 27.2–33.4)
MCHC: 32.6 g/dL (ref 32.0–36.0)
MCV: 92.6 fL (ref 79.3–98.0)
MONO#: 0.5 10*3/uL (ref 0.1–0.9)
MONO%: 8.5 % (ref 0.0–14.0)
NEUT#: 3.6 10*3/uL (ref 1.5–6.5)
NEUT%: 60.3 % (ref 39.0–75.0)
Platelets: 208 10*3/uL (ref 140–400)
RBC: 5 10*6/uL (ref 4.20–5.82)
RDW: 13.6 % (ref 11.0–14.6)
WBC: 5.9 10*3/uL (ref 4.0–10.3)
lymph#: 1.6 10*3/uL (ref 0.9–3.3)

## 2014-03-07 LAB — COMPREHENSIVE METABOLIC PANEL (CC13)
ALT: 16 U/L (ref 0–55)
AST: 17 U/L (ref 5–34)
Albumin: 3.9 g/dL (ref 3.5–5.0)
Alkaline Phosphatase: 74 U/L (ref 40–150)
Anion Gap: 9 mEq/L (ref 3–11)
BUN: 11.8 mg/dL (ref 7.0–26.0)
CO2: 27 mEq/L (ref 22–29)
Calcium: 9.8 mg/dL (ref 8.4–10.4)
Chloride: 100 mEq/L (ref 98–109)
Creatinine: 0.8 mg/dL (ref 0.7–1.3)
Glucose: 89 mg/dl (ref 70–140)
Potassium: 4.2 mEq/L (ref 3.5–5.1)
Sodium: 135 mEq/L — ABNORMAL LOW (ref 136–145)
Total Bilirubin: 0.67 mg/dL (ref 0.20–1.20)
Total Protein: 7.2 g/dL (ref 6.4–8.3)

## 2014-03-07 LAB — LACTATE DEHYDROGENASE (CC13): LDH: 187 U/L (ref 125–245)

## 2014-03-07 NOTE — Progress Notes (Signed)
[place], [place] [id number]  DIAGNOSIS: Melanoma of skin, site unspecified - Plan: CBC with Differential, Lactate dehydrogenase (LDH) - [facility], Basic metabolic panel (Bmet) - [facility]  Chief Complaint  Patient presents with  . Melanoma of skin, site unspecified    CURRENT TREATMENT:  Active surveillance.   INTERVAL HISTORY: [name]. 73 y.o. male  who is recently diagnosed with melanoma of the left arm s/p wide excision and sentinel biopsy was seen on 12/06/2013 by me for evaluation and management at the request of Dr. Erroll Luna.  Today, he is accompanied by his wife [name].  He had a colonoscopy 2 weeks ago without evidence of polyps.  He did not show evidence of colitis.   Melanoma History: Patient reports going to dermatology (Dr. Elvera Lennox) about 3-4 months ago because he has a recurrent rash of his chest. He went to Dr. Juanna Cao office for cream to apply for the rash and while there he showed a spot on left arm. It was size of pea. Dr. Elvera Lennox did the biopsy in February, 06, 2015 and it was demonstrated to be a melanoma. Specifically, It demonstrated shave biopsy left antecubital area, superificial spreading, arising in an nevus, 0.83 MM to base/ deep margin, IV anatomic level absent of ulceration, peripheral margins were involved and deep margin involved, with a mitotic index of greater than 1 per MM2, absent of LVI, non-brisk tumor-infiltrating lymphocytes, absent tumor regression, pathologic stage [id number]. He has had carcinomas beneath the eye with removal. He has had a history of 2 Moh's surgery (due to basal cell carcinoma around 5 years ago) but was determined not to be a candidate for surgery based on above. He was admitted to [facility] on 03/17 for surgery. He had left arm skin biopsy with lymph node, sentinel biopsy of left axillary. There were no intranodal metastatic melanoma tumor  deposits identified on routine histology or with Melan-A and HMB-45 immuno stains.   He has recovered without difficulty. He did require an antibiotic for 5 -7 days due cellulitis. He saw Dr. Brantley Stage and he wanted him to be evaluated by oncology because a few of his patients with Stage I has had recurrence. He denies any hospitalizations or emergency room visits. He lives in [place] about 30 miles from [place]. He denies any family history of cancers. He has had a colonoscopy five years ago by Dr. Percell Miller. He will return some time this year. He had multiple polys removed. He denies weight lost. He reports that the surgery area on his left arm has nearly completely healed and will start applying McDerma skin cream. He had a major prostate surgery about 9 years ago and since his ambulation has changed resulting in cautious gait requiring a walker. He denies recent falls. His last fall was in July 2014. Wife reports he blacked out. He was admitted to [facility] without etiology defined, per her report. He was instructed not to drive for six months. This happened again in January 2015. He fell and cracked 3 ribs and had emesis. He does walking therapy with [facility] neurology. He was on aricept and they recommended against this based on his recurrent fainting spells. He has been off aricept since February, 2015. He has had new hearing aides  MEDICAL HISTORY: Past Medical History  Diagnosis Date  . Benign paroxysmal positional vertigo   . Obstructive hydrocephalus   . Abnormality of gait  takes Carbidopa-Levodopa  . Basal cell carcinoma     "primarily on my face; I've had a bunch" (02/25/2013)  . 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 Ulcerative colitis     takes Apriso daily  . Unspecified essential hypertension     takes Diovan daily  . Hyperlipidemia     is on med but unsure of name-to bring day of surgery  . Asthma     was on allergy shots and  no problems 4-73yrs ago  . Arthritis     left knee  . Joint swelling     left knee  . Reflux     takes Protonix daily  . History of colon polyps   . History of blood transfusion     no abnormal reaction noted  . Cataracts, bilateral     immature  . 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    ALLERGIES:  is allergic to lexapro and tetracyclines & related.  MEDICATIONS: has a current medication list which includes the following prescription(s): acetaminophen, carbidopa-levodopa, mesalamine, pantoprazole, pravastatin, triamcinolone cream, valsartan, and vitamin b-12.  SURGICAL HISTORY:  Past Surgical History  Procedure Laterality Date  . Suprapubic prostatectomy  02/21/2008  . Basal cell carcinoma excision Left 2000's    points to nasal fold  (02/25/2013)  . Colonoscopy    . Melanoma excision with sentinel lymph node biopsy Left 10/25/2013    Procedure: WIDE EXCISION MELANOMA LEFT ARM WITH LEFT AXILLARY  SENTINEL LYMPH NODE BIOPSY;  Surgeon: Joyice Faster. Cornett, MD;  Location: [facility];  Service: General;  Laterality: Left;    REVIEW OF SYSTEMS:   Constitutional: Denies fevers, chills or abnormal weight loss Eyes: Denies blurriness of vision Ears, nose, mouth, throat, and face: Denies mucositis or sore throat Respiratory: Denies cough, dyspnea or wheezes Cardiovascular: Denies palpitation, chest discomfort or lower extremity swelling Gastrointestinal:  Denies nausea, heartburn or change in bowel  habits Skin: Denies abnormal skin rashes Lymphatics: Denies new lymphadenopathy or easy bruising Neurological:Denies numbness, tingling or new weaknesses Behavioral/Psych: Mood is stable, no new changes  All other systems were reviewed with the patient and are negative.  PHYSICAL EXAMINATION: ECOG PERFORMANCE STATUS: 1 - Symptomatic but completely ambulatory  Blood pressure 162/77, pulse 67, temperature 98.1 F (36.7 C), temperature source Oral, resp. rate 18, height 5\' 6"  (1.676 m), weight 163 lb 1.6 oz (73.982 kg), SpO2 99.00%.  GENERAL:alert, no distress and comfortable; ambulates with a walker.  SKIN: skin color, texture, turgor are normal, no rashes or significant lesions; L mid forearm scar well healed.  EYES: normal, Conjunctiva are pink and non-injected, sclera clear OROPHARYNX:no exudate, no erythema and lips, buccal mucosa, and tongue normal  NECK: supple, thyroid normal size, non-tender, without nodularity LYMPH:  no palpable lymphadenopathy in the cervical, axillary or supraclavicular LUNGS: clear to auscultation with normal breathing effort, no wheezes or rhonchi HEART: regular rate & rhythm and no murmurs and no lower extremity edema ABDOMEN:abdomen soft, non-tender and normal bowel sounds Musculoskeletal:no cyanosis of digits and no clubbing; Left knee swollen (chronic)  NEURO: alert & oriented x 3 with fluent speech, no focal motor/sensory deficits  Labs:  Lab Results  Component Value Date   WBC 5.9 03/07/2014   HGB 15.1 03/07/2014   HCT 46.3 03/07/2014   MCV 92.6 03/07/2014   PLT 208 03/07/2014   NEUTROABS 3.6 03/07/2014      Chemistry      Component Value Date/Time   NA 135* 03/07/2014 1201   NA 136* 10/21/2013 1046   K 4.2 03/07/2014 1201   K 4.7 10/21/2013 1046   CL 97 10/21/2013 1046   CO2 27 03/07/2014 1201   CO2 26 10/21/2013 1046   BUN 11.8 03/07/2014 1201   BUN 11 10/21/2013 1046   CREATININE 0.8 03/07/2014 1201   CREATININE 0.61 10/21/2013 1046       Component Value Date/Time   CALCIUM 9.8 03/07/2014 1201   CALCIUM 9.5 10/21/2013 1046   ALKPHOS 74 03/07/2014 1201   ALKPHOS 110 10/21/2013 1046   AST 17 03/07/2014 1201   AST 22 10/21/2013 1046   ALT 16 03/07/2014 1201   ALT 8 10/21/2013 1046   BILITOT 0.67 03/07/2014 1201   BILITOT 0.3 10/21/2013 1046       Basic Metabolic Panel:  Recent Labs Lab 03/07/14 1201  NA 135*  K 4.2  CO2 27  GLUCOSE 89  BUN 11.8  CREATININE 0.8  CALCIUM 9.8   GFR Estimated Creatinine Clearance: 76.4 ml/min (by C-G formula based on Cr of 0.8). Liver Function Tests:  Recent Labs Lab 03/07/14 1201  AST 17  ALT 16  ALKPHOS 74  BILITOT 0.67  PROT 7.2  ALBUMIN 3.9   No results found for this basename: LIPASE, AMYLASE,  in the last 168 hours No results found for this basename: AMMONIA,  in the last 168 hours Coagulation profile No results found for this basename: INR, PROTIME,  in the last 168 hours  CBC:  Recent Labs Lab 03/07/14 1201  WBC 5.9  NEUTROABS 3.6  HGB 15.1  HCT 46.3  MCV 92.6  PLT 208    Anemia work up No results found for this basename: VITAMINB12, FOLATE, FERRITIN, TIBC, IRON, RETICCTPCT,  in the last 72 hours  Studies:  No results found.   RADIOGRAPHIC STUDIES: No results found.  PATHOLOGY:  Diagnosis  1. Lymph node, sentinel, biopsy, Left axillary  - ONE LYMPH NODE, NEGATIVE FOR TUMOR (0/1) SEE COMMENT.  2. Skin , Left arm - NEGATIVE FOR MALIGNANT MELANOMA, SEE COMMENT. - PREVIOUS BIOPSY SITE IDENTIFIED. - SURGICAL MARGINS, NEGATIVE FOR TUMOR. Microscopic Comment  1. There are no intranodal metastatic melanoma tumor deposits identified on routine histology or with Melan-A and HMB-45 immunostains. 2. The previous biopsy demonstrating pT1b malignant melanoma is noted ([id number]). The current excision demonstrates previous biopsy site without the presence of residual malignant melanoma. Additional non-neoplastic findings include extensive solar elastosis and  actinic keratosis. Please see the previous biopsy for the melanoma pathologic features. The surgical resection margin(s) of the specimen were inked and microscopically evaluated. (CR:ecj 10/26/2013)  Mali RUND DO Pathologist, Electronic Signature (Case signed 10/27/2013)   ASSESSMENT: [name]. 73 y.o. male with a history of Melanoma of skin, site unspecified - Plan: CBC with Differential, Lactate dehydrogenase (LDH) - [facility], Basic metabolic panel (Bmet) - [facility]  PLAN:   1. Melanoma of L arm, Stage IB (0.76-1.0 mm thick with mitotic rate greater than 1 per mm^2).  --As previously reported, we reviewed the pathology and imaging and labs with the patient and his  consistent with the above diagnosis on initial consultation. He is s/p sentinel node biopsy and wide excision with sentinel node negative. Based on current NCCN Guidelines Version 3.2015, we recommend based on if stage IB, observation versus clinical trial. Our institution do not presently have clinical trials in this context. Furthermore, the patient declines clinical trial enrollment and referral for consideration. I would agree given his relative co-morbidities making him less likely to be a candidate presently. We discussed the risks and benefits in depth, and understanding these including small chance of recurrence disease, he chooses observation. Since the patient does not have Stage IIB or IIC, he is not a candidate for interferon alfa (category B).  --We then discussed continued follow up every 6 months to one year with history and physical focused on physical exam.    All questions were answered. The patient knows to call the clinic with any problems, questions or concerns. We can certainly see the patient much sooner if necessary.  I spent 10 minutes counseling the patient face to face. The total time spent in the appointment was 15 minutes.    Arma Reining, MD 03/07/2014 2:04 PM

## 2014-03-08 ENCOUNTER — Ambulatory Visit: Payer: Medicare Other | Admitting: Physical Therapy

## 2014-03-08 DIAGNOSIS — IMO0001 Reserved for inherently not codable concepts without codable children: Secondary | ICD-10-CM | POA: Diagnosis not present

## 2014-03-14 ENCOUNTER — Ambulatory Visit: Payer: Medicare Other | Admitting: Cardiovascular Disease

## 2014-03-16 NOTE — Telephone Encounter (Signed)
Noted  

## 2014-05-08 ENCOUNTER — Ambulatory Visit: Payer: Medicare Other | Admitting: Cardiovascular Disease

## 2014-05-23 ENCOUNTER — Encounter: Payer: Self-pay | Admitting: Neurology

## 2014-05-23 ENCOUNTER — Ambulatory Visit (INDEPENDENT_AMBULATORY_CARE_PROVIDER_SITE_OTHER): Payer: Medicare Other | Admitting: Neurology

## 2014-05-23 ENCOUNTER — Encounter (INDEPENDENT_AMBULATORY_CARE_PROVIDER_SITE_OTHER): Payer: Self-pay

## 2014-05-23 VITALS — BP 134/71 | HR 69 | Temp 97.4°F | Resp 14 | Ht 66.0 in | Wt 155.5 lb

## 2014-05-23 DIAGNOSIS — G911 Obstructive hydrocephalus: Secondary | ICD-10-CM

## 2014-05-23 DIAGNOSIS — G20 Parkinson's disease: Secondary | ICD-10-CM

## 2014-05-23 DIAGNOSIS — R269 Unspecified abnormalities of gait and mobility: Secondary | ICD-10-CM

## 2014-05-23 NOTE — Progress Notes (Signed)
Subjective:    Patient ID: [name]. is a 73 y.o. male.  HPI    Interim history:   [name] is a 73 year old gentleman with an underlying medical history of paroxysmal vertigo, hypertension, hyperlipidemia, obstructive hydrocephalus, history of syncope, memory loss, cervical spine degenerative disease, and melanoma of the skin, who presents for followup consultation of his parkinsonism and gait disorder. He is accompanied by his wife again today. I last saw him on 01/04/2014, at which time he reported no recent syncopal spells since January 2015. He was no longer driving. He had physical therapy with good results in the past. He had new hearing aids. He was mobilizing with a walker but therapy was able to get to a point where he was only using a cane. He still endorse that Sinemet was helpful despite not having idiopathic Parkinson's disease most likely. He had been seeing his dermatologist regularly and most recently saw him on 03/07/2014 with a recommendation for a six-month followup. Thankfully one lymph node that was removed in the context of his melanoma surgery was negative for cancer. I referred him back to physical therapy. He had decided not to pursue a DaT scan. I kept his medication the same.  Today, his wife and he reports that his gait has become worse. He finished PT and he has not been doing his exercises. He has lost some weight, but has not been trying to lose weight. He has not fallen. He has not had lightheadedness. He was started on Zoloft 25 mg daily. He has been using Ensure. He uses his walker at all times. Unfortunately, his wife fell and broke her L wrist and this caused a lot of stress.   I saw him on 08/29/2013, at which time I felt his gait disorder was multifactorial in nature. I felt he had some parkinsonism but no idiopathic Parkinson's disease. He had a recent head CT which showed stable findings. His wife was wondering if he needed a shunt place. I offered a referral  to neurosurgery but they decline. We talked about potentially doing a DaT scan but he indicated that he would like to think about it. I provided him with information material the scan. He was in the process of tapering off Aricept. He had a syncopal spell and fell with rib fractures sustained. He felt that he had benefited from levodopa therapy and therefore I kept him on generic Sinemet. In the interim, we had to reschedule his June appointment. We offered him a sooner appointment with our nurse practitioner which he declined, as he did not wish to see a nurse practitioner.  I saw him on 03/03/2013 after a recent hospitalization for syncope. He had seen by cardiology and also saw Dr. Carles [name] at [facility] neurology. I reviewed notes from Dr. Carles [name] as well as Dr. Johnsie [name] in cardiology. Dr. Carles [name] saw the patient on 04/04/13 and felt, he had some parkinsonism but not idiopathic Parkinson's disease. She noted signs of peripheral neuropathy and added further labs. She referred him for speech therapy. His blood work showed a normal IFE, normal RPR, normal folate level, and a low normal B12 level and she recommended starting B12 vitamin. He saw Dr. Johnsie [name] on 03/14/2013 who stopped his beta blocker and did not find any Cardiologic etiology of his syncope. He was hospitalized from 08/12/2013 through 08/15/2013 after a syncopal event resulting in a fall during which he sustained rib fractures. He was kept on Sinemet. He has not had any falls or syncopal  since then. He was also advised that his syncope may come from Aricept and he was on 1/2 pill qHS for one week, then 1/2 pill qod for one week. He had a CT chest on 08/12/13 and a CXR on 08/13/13, which I reviewed the reports of. He had a [place] on 08/12/13, which I reviewed through the PACS system. I also reviewed the report: Chronic noncommunicating hydrocephalus. No acute intracranial findings.  He was evaluated for hearing loss and was supposed to get hearing aids. He had an appointment for  cognitive testing, but he wanted to delay it till after his hearing aids. He finished speech therapy.  I first met him on 02/04/2013 with a history of parkinsonism and gait disorder. He previously followed with [name] and was last seen by him on 10/05/2012, at which time [name] felt that his gait was improved on levodopa therapy. 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 He started having difficulty with his walking after prostate surgery in 2009. 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 He had an EEG in December 2011 showing transient slowing in the right central temporal region. He has had memory loss. 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 Since he had done fairly well on levodopa therapy, and I kept him on the same dose. The patient was admitted to [facility] on 02/25/2013 and discharged on 02/27/2013 due to syncope and collapse. The patient has had 2 similar episodes in the past. He was admitted with telemetry monitoring and it showed no dysrhythmias. EEG was normal in the awake and drowsy state. His carotid Doppler study showed mild to moderate soft plaque in the common carotid artery and origin of the internal carotid artery on the right and minimal plaque on the left. Vertebral artery flow was antegrade. Cardiac echo showed mild AS and trivial aortic and tricuspid regurgitation as well as mitral regurgitation. Ejection fraction was preserved at 55%. No wall motion abnormalities were seen. He had serial cardiac enzymes which were negative. He was started on Lopressor. Head CT from 02/25/2013 showed no acute intracranial abnormalities, brain atrophy, similar appearance of chronic hydrocephalus.    His Past Medical History Is Significant For: Past Medical History  Diagnosis Date  . Benign paroxysmal positional vertigo   . Obstructive  hydrocephalus   . Abnormality of gait     takes Carbidopa-Levodopa  . Basal cell carcinoma     "primarily on my face; I've had a bunch" (02/25/2013)  . 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 Ulcerative colitis     takes Apriso daily  . Unspecified essential hypertension     takes Diovan daily  . Hyperlipidemia     is on med but unsure of name-to bring day of surgery  . Asthma     was on allergy shots and no problems 4-75yr ago  . Arthritis     left knee  . Joint swelling     left knee  . Reflux     takes Protonix daily  . History of colon polyps   . History of blood transfusion     no abnormal reaction noted  . Cataracts, bilateral     immature  . Complication of anesthesia     "I have aqueductal stenosis" (02/25/2013); concerns re:  general anesthesia b/c he didn't devleop gait disturbance until the post-operative period following prostectomy in 2009    His Past Surgical History Is Significant For: Past Surgical History  Procedure Laterality Date  . Suprapubic prostatectomy  02/21/2008  . Basal cell carcinoma excision Left 2000's    points to nasal fold  (02/25/2013)  . Colonoscopy    . Melanoma excision with sentinel lymph node biopsy Left 10/25/2013    Procedure: WIDE EXCISION MELANOMA LEFT ARM WITH LEFT AXILLARY  SENTINEL LYMPH NODE BIOPSY;  Surgeon: Joyice Faster. Cornett, MD;  Location: [address];  Service: General;  Laterality: Left;    His Family History Is Significant For: Family History  Problem Relation Age of Onset  . Heart failure Father   . Heart disease Father   . Dementia Mother     His Social History Is Significant For: History   Social History  . Marital Status: Married    Spouse Name: N/A    Number of Children: N/A  . Years of Education: N/A   Social History Main Topics  . Smoking status: Never Smoker   . Smokeless tobacco: Never Used     Comment: 02/25/2013 "quit smoking > 20 yr ago"  . Alcohol Use: Yes      Comment: glass of wine occasionally  . Drug Use: No  . Sexual Activity: Yes   Other Topics Concern  . None   Social History Narrative   Right handed. Caffeine 2 cups coffee/ daily, Masters in [place], Married, no kids, one poodle.    His Allergies Are:  Allergies  Allergen Reactions  . Lexapro [Escitalopram Oxalate] Other (See Comments)    Unknown: reaction occurred some time ago and pt cannot remember what it was  . Tetracyclines & Related Other (See Comments)    Unknown: Reaction occurred some time ago and pt cannot remember what it was   :   His Current Medications Are:  Outpatient Encounter Prescriptions as of 05/23/2014  Medication Sig  . acetaminophen (TYLENOL ARTHRITIS PAIN) 650 MG CR tablet Take 650 mg by mouth every 8 (eight) hours as needed.   . carbidopa-levodopa (SINEMET IR) 25-100 MG per tablet Take 1 tablet by mouth 3 (three) times daily. Take one tablet in the morning, one tablet at 3 pm, and one tablet at bedtime.  . mesalamine (APRISO) 0.375 G 24 hr capsule Take 375 mg by mouth every morning. Takes 2 tablets each morning  . pantoprazole (PROTONIX) 40 MG tablet Take 40 mg by mouth 2 (two) times daily.  . pravastatin (PRAVACHOL) 20 MG tablet Take 20 mg by mouth daily.  . sertraline (ZOLOFT) 25 MG tablet Take 25 mg by mouth.  . valsartan (DIOVAN) 160 MG tablet Take 80 mg by mouth every other day.  . vitamin B-12 (CYANOCOBALAMIN) 1000 MCG tablet Take 1,000 mcg by mouth every morning.  . fluorouracil (EFUDEX) 5 % cream   . triamcinolone cream (KENALOG) 0.1 % Apply 1 application topically 2 (two) times daily as needed.   :  Review of Systems:  Out of a complete 14 point review of systems, all are reviewed and negative with the exception of these symptoms as listed below:   Review of Systems  Constitutional: Positive for unexpected weight change.       Excessive sweating  Neurological:       Weakness, passing out    Objective:  Neurologic Exam  Physical  Exam Physical Examination:   Filed Vitals:   05/23/14 1506  BP: 134/71  Pulse: 69  Temp: 97.4 F (36.3 C)  Resp: 14   General Examination: The patient is a very pleasant 73 y.o. male in no acute distress. He denies lightheadedness upon standing.  HEENT: Macrocephalic, atraumatic, pupils are equal, round and reactive to light and accommodation. Extraocular tracking shows mild saccadic breakdown without nystagmus noted. There is no limitation to gaze. There is no decrease in eye blink rate. Hearing is good. Face is symmetric with no facial masking and normal facial sensation. There is no lip, neck or jaw tremor. Neck is not rigid with intact passive ROM. There are no carotid bruits on auscultation. Oropharynx exam reveals mild mouth dryness. No significant airway crowding is noted. Mallampati is class II. Tongue protrudes centrally and palate elevates symmetrically. There is no drooling.   Chest: is clear to auscultation without wheezing, rhonchi or crackles noted.  Heart: sounds are regular and normal without murmurs, rubs or gallops noted.   Abdomen: is soft, non-tender and non-distended with normal bowel sounds appreciated on auscultation.  Extremities: There is no pitting edema in the distal lower extremities bilaterally. Pedal pulses are intact.   Skin: is warm and dry with no trophic changes noted. Age-related changes are noted on the skin.   Musculoskeletal: exam reveals: Left knee swelling and genu varus on the left. He has a well-healing scar in the left antecubital fossa area.  Neurologically:  Mental status: The patient is awake and alert, paying good attention. He is able to completely provide the history. His wife provides additional details. He is oriented to: person, place, time/date, situation, day of week, month of year and year. His memory, attention, language and knowledge are fairly well intact. There is no aphasia, agnosia, apraxia or anomia. There is a no degree of  bradyphrenia. Speech is mildly hypophonic with no dysarthria noted. Mood is congruent and affect is normal.   Cranial nerves are as described above under HEENT exam. In addition, shoulder shrug is normal with equal shoulder height noted.  Motor exam: Normal bulk, and strength for age is noted. There are no dyskinesias noted. Tone is not rigid with absence of cogwheeling in the extremities. There is overall no bradykinesia. There is no drift or rebound.  There is no tremor.   Romberg is negative, but he has to stand wide-based.  Reflexes are 1+ in the upper extremities and 1+ in the lower extremities. Fine motor skills exam: Fine motor skills are fairly well preserved in the upper extremities and mildly impaired in the lower extremities. Cerebellar testing shows no dysmetria or intention tremor on finger to nose testing. Heel to shin is unremarkable bilaterally. There is no truncal or gait ataxia.   Sensory exam is intact to light touch in the upper and lower extremities.   Gait, station and balance: He stands up with mild difficulty and has to push himself up. He stands mildly wide-based. He is bowlegged especially in the left, unchanged. He has no significant start hesitation but has some stuttersteps, no frank freezing. He walks with his 2 wheeled walker and maneuvers it fairly well, but may be overall is a little bit more insecure than last time. His balance is mildly impaired. He does not have a typical parkinsonian shuffle. His posture is mildly stooped but unchanged. He turns in 3-4 steps. He does have slight difficulty turning. He walks slowly.  Assessment and Plan:   In summary, Milford Antolin is a very pleasant 73 year old male with a history of hydrocephalus, likely congenital  and gait disorder which is probably multifactorial with mild evidence of parkinsonism, which has been stable. He has had no recent syncopal spells. He was taken off of Aricept after he had a syncopal spell in the  past. His exam has remained stable for the most part with the exception of mild insecurity noted with his walking and some stutter steps. He has some parkinsonism but no frank signs and symptoms of Parkinson's disease. He decided not to pursue the DaT scan in the past when we discussed this. He was diagnosed with melanoma of his left arm and had surgical removal of his melanoma. He has recently seen his dermatologist. He wishes to continue to be on levodopa therapy. I suggested keeping it at this dose. I would be reluctant to increase it. He does not wish to come off of it as he feels it has helped. He would like to go back to physical therapy which he finished in July. I will request physical therapy again. He is advised to stay well-hydrated and to continue his exercises and uses walker at all times. He is encouraged to monitor his weight. He has had recent stressors which may have resulted in mild weight loss of approximately 5-6 pounds. He is he does continue to lose weight he is encouraged to talk to his primary care physician. I answered all their questions today and the patient and  his wife were in agreement. I will see him back routinely in 6 months, sooner if the need arises.

## 2014-05-23 NOTE — Patient Instructions (Addendum)
We will do physical therapy.  Continue to do walking exercises.  Drink plenty of water.  We will see you back in 6 months.  Try to eat health snacks in between.

## 2014-06-13 ENCOUNTER — Ambulatory Visit: Payer: Medicare Other | Admitting: Cardiovascular Disease

## 2014-06-26 ENCOUNTER — Ambulatory Visit: Payer: Medicare Other | Admitting: Physical Therapy

## 2014-06-29 ENCOUNTER — Ambulatory Visit: Payer: Medicare Other | Admitting: Physical Therapy

## 2014-07-03 ENCOUNTER — Ambulatory Visit: Payer: Medicare Other | Admitting: Physical Therapy

## 2014-07-04 ENCOUNTER — Ambulatory Visit: Payer: Medicare Other | Admitting: Physical Therapy

## 2014-07-11 ENCOUNTER — Ambulatory Visit: Payer: Medicare Other | Admitting: Physical Therapy

## 2014-07-13 ENCOUNTER — Ambulatory Visit: Payer: Medicare Other | Admitting: Physical Therapy

## 2014-08-17 ENCOUNTER — Telehealth: Payer: Self-pay | Admitting: Internal Medicine

## 2014-08-17 NOTE — Telephone Encounter (Signed)
pt called to cx appt due to surgery.Marland KitchenMarland KitchenMarland Kitchen

## 2014-09-05 ENCOUNTER — Other Ambulatory Visit: Payer: Medicare Other

## 2014-09-05 ENCOUNTER — Ambulatory Visit: Payer: Medicare Other

## 2014-10-23 ENCOUNTER — Other Ambulatory Visit: Payer: Self-pay | Admitting: Neurology

## 2014-10-26 ENCOUNTER — Emergency Department (HOSPITAL_COMMUNITY): Payer: Medicare Other

## 2014-10-26 ENCOUNTER — Encounter (HOSPITAL_COMMUNITY): Payer: Self-pay | Admitting: Emergency Medicine

## 2014-10-26 ENCOUNTER — Inpatient Hospital Stay (HOSPITAL_COMMUNITY)
Admission: EM | Admit: 2014-10-26 | Discharge: 2014-10-27 | DRG: 312 | Disposition: A | Discharge status: Home / Self Care | Payer: Medicare Other | Attending: Oncology | Admitting: Oncology

## 2014-10-26 DIAGNOSIS — Z85820 Personal history of malignant melanoma of skin: Secondary | ICD-10-CM | POA: Diagnosis not present

## 2014-10-26 DIAGNOSIS — M1990 Unspecified osteoarthritis, unspecified site: Secondary | ICD-10-CM | POA: Diagnosis present

## 2014-10-26 DIAGNOSIS — J45909 Unspecified asthma, uncomplicated: Secondary | ICD-10-CM | POA: Diagnosis present

## 2014-10-26 DIAGNOSIS — I10 Essential (primary) hypertension: Secondary | ICD-10-CM | POA: Diagnosis present

## 2014-10-26 DIAGNOSIS — Z66 Do not resuscitate: Secondary | ICD-10-CM | POA: Diagnosis present

## 2014-10-26 DIAGNOSIS — F329 Major depressive disorder, single episode, unspecified: Secondary | ICD-10-CM | POA: Diagnosis present

## 2014-10-26 DIAGNOSIS — E785 Hyperlipidemia, unspecified: Secondary | ICD-10-CM | POA: Diagnosis present

## 2014-10-26 DIAGNOSIS — H8110 Benign paroxysmal vertigo, unspecified ear: Secondary | ICD-10-CM | POA: Diagnosis present

## 2014-10-26 DIAGNOSIS — K5190 Ulcerative colitis, unspecified, without complications: Secondary | ICD-10-CM | POA: Diagnosis present

## 2014-10-26 DIAGNOSIS — K219 Gastro-esophageal reflux disease without esophagitis: Secondary | ICD-10-CM | POA: Diagnosis present

## 2014-10-26 DIAGNOSIS — R55 Syncope and collapse: Secondary | ICD-10-CM | POA: Diagnosis not present

## 2014-10-26 DIAGNOSIS — R269 Unspecified abnormalities of gait and mobility: Secondary | ICD-10-CM | POA: Diagnosis not present

## 2014-10-26 DIAGNOSIS — Z79899 Other long term (current) drug therapy: Secondary | ICD-10-CM | POA: Diagnosis not present

## 2014-10-26 DIAGNOSIS — G20 Parkinson's disease: Secondary | ICD-10-CM | POA: Diagnosis present

## 2014-10-26 DIAGNOSIS — H269 Unspecified cataract: Secondary | ICD-10-CM | POA: Diagnosis present

## 2014-10-26 LAB — I-STAT CHEM 8, ED
BUN: 15 mg/dL (ref 6–23)
Calcium, Ion: 1.27 mmol/L (ref 1.13–1.30)
Chloride: 98 mmol/L (ref 96–112)
Creatinine, Ser: 0.7 mg/dL (ref 0.50–1.35)
Glucose, Bld: 94 mg/dL (ref 70–99)
HCT: 43 % (ref 39.0–52.0)
Hemoglobin: 14.6 g/dL (ref 13.0–17.0)
Potassium: 4 mmol/L (ref 3.5–5.1)
Sodium: 136 mmol/L (ref 135–145)
TCO2: 23 mmol/L (ref 0–100)

## 2014-10-26 LAB — BASIC METABOLIC PANEL
Anion gap: 10 (ref 5–15)
BUN: 12 mg/dL (ref 6–23)
CO2: 25 mmol/L (ref 19–32)
Calcium: 9.3 mg/dL (ref 8.4–10.5)
Chloride: 100 mmol/L (ref 96–112)
Creatinine, Ser: 0.75 mg/dL (ref 0.50–1.35)
GFR calc Af Amer: >90 mL/min (ref >90)
GFR calc non Af Amer: 89 mL/min — ABNORMAL LOW (ref >90)
Glucose, Bld: 97 mg/dL (ref 70–99)
Potassium: 4.1 mmol/L (ref 3.5–5.1)
Sodium: 135 mmol/L (ref 135–145)

## 2014-10-26 LAB — CBC
HCT: 41.3 % (ref 39.0–52.0)
Hemoglobin: 14 g/dL (ref 13.0–17.0)
MCH: 31.3 pg (ref 26.0–34.0)
MCHC: 33.9 g/dL (ref 30.0–36.0)
MCV: 92.2 fL (ref 78.0–100.0)
Platelets: 193 10*3/uL (ref 150–400)
RBC: 4.48 MIL/uL (ref 4.22–5.81)
RDW: 12.8 % (ref 11.5–15.5)
WBC: 6 10*3/uL (ref 4.0–10.5)

## 2014-10-26 LAB — I-STAT TROPONIN, ED: Troponin i, poc: 0 ng/mL (ref 0.00–0.08)

## 2014-10-26 NOTE — ED Notes (Signed)
EMS transported patient from a restaurant due to syncope 2-3 seconds. Patient alert answering and following commands appropriate. History of syncope.

## 2014-10-26 NOTE — H&P (Signed)
Date: 10/26/2014               Patient Name:  [name]. MRN: [id number]  DOB: June 18, 1941 Age / Sex: 74 y.o., male   PCP: Dione Housekeeper, MD         Medical Service: Internal Medicine Teaching Service         Attending Physician: Dr. Annia Belt, MD    First Contact: Dr. Osa Craver  Pager: [phone]  Second Contact: Dr. Bing Neighbors  Pager: [phone]       After Hours (After 5p/  First Contact Pager: [phone]  weekends / holidays): Second Contact Pager: [phone]   Chief Complaint: Syncope  History of Present Illness: [name] is a 74 year old gentleman with history of syncope, parkinsonism, history of melanoma of the left arm status post wide excision and sentinel biopsy, hypertension, depression, ulcerative colitis who presents with syncope. At the time of interview, his friend Mortimer Fries was present and contributed to the interview.  Earlier today, he was going to restaurants when he collapsed on the floor. He does not remember what happened thereafter except for going to the ambulance. Eyewitness accounts of the entire incident as reported by [place] describes him as "did not appear well." Symptoms he reports immediately after the incident include weakness though no urinary incontinence or tongue biting. At baseline, he came in with the walker and describes he has intermittent dizziness which she describes as "the room is spinning" and a "cautious gait "which is from his parkinsonism for which he follows with Dr. Star Age [[facility]] and and is on Sinemet. He was last seen on 05/23/2014. He has had prior hospitalizations for similar episodes, most recently being in January 2015 at which time he broke 3 ribs after sustaining a fall, though has not had any episodes since then. At that hospitalization, he was taken off Aricept as it was thought to be contributory. Otherwise, he denies any changes to his vision, headache, numbness or tingling, changes to his medication,  missing any of his medications. He was at home with his wife and spends his time playing the piano. In the ED, initial troponin was negative and orthostatic vital signs were within normal limits.    Meds: Current Facility-Administered Medications  Medication Dose Route Frequency Provider Last Rate Last Dose  . heparin injection 5,000 Units  5,000 Units Subcutaneous 3 times per day Otho Bellows, MD      . pantoprazole (PROTONIX) EC tablet 40 mg  40 mg Oral BID Otho Bellows, MD      . pravastatin (PRAVACHOL) tablet 20 mg  20 mg Oral q1800 Otho Bellows, MD      . sodium chloride 0.9 % injection 3 mL  3 mL Intravenous Q12H Otho Bellows, MD      . vitamin B-12 (CYANOCOBALAMIN) tablet 1,000 mcg  1,000 mcg Oral q morning - 10a Otho Bellows, MD        Allergies: Allergies as of 10/26/2014 - Review Complete 10/26/2014  Allergen Reaction Noted  . Lexapro [escitalopram oxalate] Other (See Comments) 02/04/2013  . Tetracyclines & related Other (See Comments) 02/04/2013   Past Medical History  Diagnosis Date  . Benign paroxysmal positional vertigo   . Obstructive hydrocephalus   . Abnormality of gait     takes Carbidopa-Levodopa  . Basal cell carcinoma     "primarily on my face; I've had a bunch" (02/25/2013)  . 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 Ulcerative colitis     takes Apriso daily  . Unspecified essential hypertension     takes Diovan daily  . Hyperlipidemia     is on med but unsure of name-to bring day of surgery  . Asthma     was on allergy shots and no problems 4-58yrs ago  . Arthritis     left knee  . Joint swelling     left knee  . Reflux     takes Protonix daily  . History of colon polyps   . History of blood transfusion     no abnormal reaction noted  . Cataracts, bilateral     immature  . 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 Suprapubic prostatectomy  02/21/2008  . Basal cell carcinoma excision Left 2000's    points to nasal fold  (02/25/2013)  . Colonoscopy    . Melanoma excision with sentinel lymph node biopsy Left 10/25/2013    Procedure: WIDE EXCISION MELANOMA LEFT ARM WITH LEFT AXILLARY  SENTINEL LYMPH NODE BIOPSY;  Surgeon: Joyice Faster. Cornett, MD;  Location: [facility];  Service: General;  Laterality: Left;   Family History  Problem Relation Age of Onset  . Heart failure Father   . Heart disease Father   . Dementia Mother    History   Social History  . Marital Status: Married    Spouse Name: N/A  . Number of Children: N/A  . Years of Education: N/A   Occupational History  . Not on file.   Social History Main Topics  . Smoking status: Never Smoker   . Smokeless tobacco: Never Used     Comment: 02/25/2013 "quit smoking > 20 yr ago"  . Alcohol Use: Yes     Comment: glass of wine occasionally  . Drug Use: No  . Sexual Activity: Yes   Other Topics Concern  . Not on file   Social History Narrative   Right handed. Caffeine 2 cups coffee/ daily, Masters in [name], Married, no kids, one poodle.    Review of Systems: Review of Systems  Constitutional: Negative for fever and chills.  Respiratory: Negative for cough and shortness of breath.   Cardiovascular: Negative for chest pain and leg swelling.  Gastrointestinal: Negative for vomiting and abdominal pain.  Genitourinary: Negative for dysuria.  Neurological: Positive for loss of consciousness. Negative for dizziness, tingling and headaches.     Physical Exam: Blood pressure 150/91, pulse 63, temperature 97.7 F (36.5 C), temperature source Oral, resp. rate 21, height 5\' 10"  (1.778 m), weight 155 lb (70.308 kg), SpO2 98 %.  General: Thin Caucasian male, resting in bed, NAD HEENT: PERRL, EOMI, no  scleral icterus, oropharynx clear Cardiac: RRR, systolic ejection murmur best auscultated at the right upper sternal border Pulm: clear to auscultation bilaterally, no wheezes, rales, or rhonchi Abd: soft, nontender, nondistended, BS present Ext: warm and well perfused, no pedal edema Neuro: CN II-XII intact, 5/5 upper and lower extremity strength, 2+ grip strength    Lab results: Basic Metabolic Panel:  Recent Labs  10/26/14 1339 10/26/14 1409  NA 135 136  K 4.1 4.0  CL 100 98  CO2 25  --   GLUCOSE 97 94  BUN 12 15  CREATININE 0.75 0.70  CALCIUM 9.3  --    CBC:  Recent Labs  10/26/14 1339 10/26/14 1409  WBC 6.0  --   HGB 14.0 14.6  HCT 41.3 43.0  MCV 92.2  --   PLT 193  --     Imaging results:  Dg Chest 2 View  10/26/2014   CLINICAL DATA:  Acute onset of loss of consciousness. Initial encounter.  EXAM: CHEST  2 VIEW  COMPARISON:  Chest radiograph performed 08/13/2013, and CTA of the chest performed 08/12/2008  FINDINGS: The lungs are well-aerated and clear. There is no evidence of focal opacification, pleural effusion or pneumothorax.  The heart is normal in size; the mediastinal contour is within normal limits. No acute osseous abnormalities are seen.  IMPRESSION: No acute cardiopulmonary process seen.   Electronically Signed   By: Garald Balding M.D.   On: 10/26/2014 19:53    Other results: EKG: Reviewed and compared with 08/12/2013 Normal sinus rhythm Normal axis Q wave in lead V2, stable from prior   Assessment & Plan by Problem:  Syncope: Differential includes cardiogenic, neurogenic, orthostatic. Neurogenic is likely given his baseline parkinsonism and features consistent with episodes prompting prior hospitalizations at which time no other causes were elicited. His medications may also be contributory as Sinemet and mesalamine both have syncope reported as a side effect. Head CT 08/12/2013 was notable for chronic noncommunicating hydrocephalus though unsure if  this may have worsened however no neurologic deficits were noted on physical exam. Other neurologic causes, like CVA, appear less likely given the absence of focal deficits on neurologic exam. Carotid Dopplers 02/26/2013 no formal for mild to moderate soft plaque in the right common carotid artery and at the origin the internal carotid artery though 0-39% stenosis bilaterally. Absence of urinary incontinence or tongue biting is not suggestive of a seizure. EEG 02/27/2013 with no findings of epileptiform activity. EKG looks otherwise unchanged from prior hospitalization. Echo 02/26/2013 with EF 55-60% and mild mitral and tricuspid regurgitation, pulmonary arterial peak pressure 31 mmHg. Orthostatic vital signs were negative in the ED, also reassuring. No recent illness which may have made him dehydrated.  -Continue pravastatin 20 mg -Check echo -Monitor on telemetry -Monitor with neurovascular checks every 4 hours -Consult PT/OT for gait and vestibular evaluation  Parkinsonism and gait disorder: As last office visit, Home meds include Sinemet 25-100 milligrams 3 times daily and vitamin B12 1000 g. -Continue vitamin B12 -Hold Sinemet  Hypertension: Home meds include valsartan 160 mg every other day. Normotensive in the ED. -Hold in the setting of his syncope  Depression: Home meds include Zoloft 25 mg. -Continue meds  Ulcerative colitis: Home meds include mesalamine 750 mg. In his office visit, Dr. Juliann Mule notes that the patient underwent colonoscopy 2 weeks prior which would be in 2015 though chart review her records show the most recent colonoscopy being 10/23/2003. -Continue Protonix 40 mg twice daily -Continue home meds  H/o of melanoma of the left arm status post wide excision and sentinel biopsy: Last seen by Dr. Juliann Mule on 03/01/2014. Per his note, biopsy 09/16/2013 notable for pathologic stage [id number]. Admitted again on 10/25/2013 for surgery at which time lymph node and sentinel biopsy of left  axilla were not notable for metastatic disease. -Continue following  #FEN:  -Diet: Heart healthy  #DVT prophylaxis: Heparin  #CODE STATUS: DNR/DNI -Defer to wife Edmonia Lynch [phone] if patients lacks decision-making capacity -Confirmed with patient on admission   Dispo: Disposition is deferred at this time, awaiting- improvement of current medical problems.  The patient does have a current PCP Dione Housekeeper, MD) and does not need an [facility] hospital follow-up appointment after discharge.  The patient does not know have transportation limitations that hinder transportation to clinic appointments.  Signed: Riccardo Dubin, MD 10/26/2014, 9:28 PM

## 2014-10-26 NOTE — ED Provider Notes (Signed)
CSN: [id number]     Arrival date & time 10/26/14  1321 History   First MD Initiated Contact with Patient 10/26/14 1854     Chief Complaint  Patient presents with  . Loss of Consciousness     (Consider location/radiation/quality/duration/timing/severity/associated sxs/prior Treatment) HPI  [name]. is a 74 y.o. male with PMH of BPPV, gait abnormality on levodopa, no active Parkinson's disease, presenting with syncopal episode today when patient was sitting down outside of a restaurant. Patient states he lost consciousness for 2-3 seconds and was witnessed loss of consciousness. No head injury. He denied any lightheadedness, nausea, vomiting, numbness, tingling, headache, visual changes, slurred speech, abdominal pain or back pain. He has had episodes of syncope before. Followed by [facility] neurology and had and had syncopal episode around a year. No new medications or changes in medications. He denies also drug use.   Past Medical History  Diagnosis Date  . Benign paroxysmal positional vertigo   . Obstructive hydrocephalus   . Abnormality of gait     takes Carbidopa-Levodopa  . Basal cell carcinoma     "primarily on my face; I've had a bunch" (02/25/2013)  . 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 Ulcerative colitis     takes Apriso daily  . Unspecified essential hypertension     takes Diovan daily  . Hyperlipidemia     is on med but unsure of name-to bring day of surgery  . Asthma     was on allergy shots and no problems 4-65yrs ago  . Arthritis     left knee  . Joint swelling     left knee  . Reflux     takes Protonix daily  . History of colon polyps   . History of blood transfusion     no abnormal reaction noted  . Cataracts, bilateral     immature  . 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 Suprapubic prostatectomy  02/21/2008  . Basal cell carcinoma excision Left 2000's    points to nasal fold  (02/25/2013)  . Colonoscopy    . Melanoma excision with sentinel lymph node biopsy Left 10/25/2013    Procedure: WIDE EXCISION MELANOMA LEFT ARM WITH LEFT AXILLARY  SENTINEL LYMPH NODE BIOPSY;  Surgeon: Joyice Faster. Cornett, MD;  Location: [facility];  Service: General;  Laterality: Left;   Family History  Problem Relation Age of Onset  . Heart failure Father   . Heart disease Father   . Dementia Mother    History  Substance Use Topics  . Smoking status: Never Smoker   . Smokeless tobacco: Never Used     Comment: 02/25/2013 "quit smoking > 20 yr ago"  . Alcohol Use: Yes     Comment: glass of wine occasionally    Review of Systems 10 Systems reviewed and are negative for acute change except as noted in the HPI.    Allergies  Lexapro and Tetracyclines & related  Home Medications   Prior to Admission medications   Medication Sig Start Date End Date Taking? Authorizing Provider  carbidopa-levodopa (SINEMET IR) 25-100 MG per tablet Take 1 tablet by mouth 3 (three) times daily. 10/23/14  Yes Star Age, MD  mesalamine (APRISO) 0.375 G 24 hr  capsule Take 750 mg by mouth every morning. Takes 2 tablets each morning   Yes Historical Provider, MD  pantoprazole (PROTONIX) 40 MG tablet Take 40 mg by mouth 2 (two) times daily.   Yes Historical Provider, MD  pravastatin (PRAVACHOL) 20 MG tablet Take 20 mg by mouth daily.   Yes Historical Provider, MD  sertraline (ZOLOFT) 25 MG tablet Take 25 mg by mouth daily.  04/11/14 04/11/15 Yes Historical Provider, MD  valsartan (DIOVAN) 160 MG tablet Take 80 mg by mouth every other day. 12/15/13  Yes Historical Provider, MD  vitamin B-12 (CYANOCOBALAMIN) 1000 MCG tablet Take 1,000 mcg by mouth every morning.   Yes Historical Provider, MD   BP 150/91 mmHg  Pulse 63   Temp(Src) 97.7 F (36.5 C) (Oral)  Resp 21  Ht 5\' 10"  (1.778 m)  Wt 155 lb (70.308 kg)  BMI 22.24 kg/m2  SpO2 98% Physical Exam  Constitutional: He appears well-developed and well-nourished. No distress.  HENT:  Head: Normocephalic and atraumatic.  Mouth/Throat: Oropharynx is clear and moist.  Eyes: Conjunctivae and EOM are normal. Pupils are equal, round, and reactive to light. Right eye exhibits no discharge. Left eye exhibits no discharge.  Neck: Normal range of motion. Neck supple.  No nuchal rigidity  Cardiovascular: Normal rate and regular rhythm.   Pulmonary/Chest: Effort normal and breath sounds normal. No respiratory distress. He has no wheezes.  Abdominal: Soft. Bowel sounds are normal. He exhibits no distension. There is no tenderness.  Neurological: He is alert. No cranial nerve deficit. Coordination normal.  Speech is clear and goal oriented. Peripheral visual fields intact. Strength 5/5 in upper and lower extremities. Sensation intact. Slow controlled rapid alternating movements, intentional tremor bilaterally with finger to nose, and intact heel to shin. No pronator drift. Slow shuffle gait with assistance.  Skin: Skin is warm and dry. He is not diaphoretic.  Nursing note and vitals reviewed.   ED Course  Procedures (including critical care time) Labs Review Labs Reviewed  BASIC METABOLIC PANEL - Abnormal; Notable for the following:    GFR calc non Af Amer 89 (*)    All other components within normal limits  CBC  CBG MONITORING, ED  I-STAT CHEM 8, ED  I-STAT TROPOININ, ED    Imaging Review Dg Chest 2 View  10/26/2014   CLINICAL DATA:  Acute onset of loss of consciousness. Initial encounter.  EXAM: CHEST  2 VIEW  COMPARISON:  Chest radiograph performed 08/13/2013, and CTA of the chest performed 08/12/2008  FINDINGS: The lungs are well-aerated and clear. There is no evidence of focal opacification, pleural effusion or pneumothorax.  The heart is normal in size; the  mediastinal contour is within normal limits. No acute osseous abnormalities are seen.  IMPRESSION: No acute cardiopulmonary process seen.   Electronically Signed   By: Garald Balding M.D.   On: 10/26/2014 19:53     EKG Interpretation   Date/Time:  Thursday October 26 2014 13:31:30 EDT Ventricular Rate:  60 PR Interval:  166 QRS Duration: 78 QT Interval:  404 QTC Calculation: 404 R Axis:   15 Text Interpretation:  Normal sinus rhythm Normal ECG since last tracing no  significant change Confirmed by Eulis Foster  MD, ELLIOTT ([id number]) on 10/26/2014  10:58:02 PM      MDM   Final diagnoses:  Syncope, unspecified syncope type   Pt without CHF with history of syncope before presents with syncope without prodrome. No chest pain, SOB, headache, abdominal or back pain. No new  medications. VSS. No new focal neurological deficits. EKG without significant change and negative troponin. Negative cxr. No significant lab abnormalities. Consult to internal medicine for observation. Spoke with Dr. Eulas Post who agrees to evaluate the patient with plan for admission to Dr. Azucena Freed service.  Discussed all results and patient verbalizes understanding and agrees with plan.  This is a shared patient. This patient was discussed with the physician who saw and evaluated the patient and agrees with the plan.     Al Corpus, PA-C 10/27/14 0000  Daleen Bo, MD 10/27/14 [phone]

## 2014-10-27 ENCOUNTER — Encounter (HOSPITAL_COMMUNITY): Payer: Self-pay | Admitting: *Deleted

## 2014-10-27 DIAGNOSIS — I10 Essential (primary) hypertension: Secondary | ICD-10-CM

## 2014-10-27 DIAGNOSIS — R55 Syncope and collapse: Secondary | ICD-10-CM

## 2014-10-27 DIAGNOSIS — F329 Major depressive disorder, single episode, unspecified: Secondary | ICD-10-CM

## 2014-10-27 DIAGNOSIS — G20 Parkinson's disease: Secondary | ICD-10-CM

## 2014-10-27 DIAGNOSIS — K5190 Ulcerative colitis, unspecified, without complications: Secondary | ICD-10-CM | POA: Diagnosis present

## 2014-10-27 DIAGNOSIS — R269 Unspecified abnormalities of gait and mobility: Secondary | ICD-10-CM

## 2014-10-27 LAB — TROPONIN I: Troponin I: <0.03 ng/mL (ref <0.031)

## 2014-10-27 LAB — CBC
HCT: 43.5 % (ref 39.0–52.0)
Hemoglobin: 14.7 g/dL (ref 13.0–17.0)
MCH: 31 pg (ref 26.0–34.0)
MCHC: 33.8 g/dL (ref 30.0–36.0)
MCV: 91.8 fL (ref 78.0–100.0)
Platelets: 195 10*3/uL (ref 150–400)
RBC: 4.74 MIL/uL (ref 4.22–5.81)
RDW: 13 % (ref 11.5–15.5)
WBC: 5.9 10*3/uL (ref 4.0–10.5)

## 2014-10-27 LAB — BASIC METABOLIC PANEL
Anion gap: 5 (ref 5–15)
BUN: 11 mg/dL (ref 6–23)
CO2: 31 mmol/L (ref 19–32)
Calcium: 9.7 mg/dL (ref 8.4–10.5)
Chloride: 102 mmol/L (ref 96–112)
Creatinine, Ser: 0.76 mg/dL (ref 0.50–1.35)
GFR calc Af Amer: >90 mL/min (ref >90)
GFR calc non Af Amer: 89 mL/min — ABNORMAL LOW (ref >90)
Glucose, Bld: 105 mg/dL — ABNORMAL HIGH (ref 70–99)
Potassium: 4.8 mmol/L (ref 3.5–5.1)
Sodium: 138 mmol/L (ref 135–145)

## 2014-10-27 LAB — GLUCOSE, CAPILLARY: Glucose-Capillary: 87 mg/dL (ref 70–99)

## 2014-10-27 MED ORDER — GLUCAGON HCL RDNA (DIAGNOSTIC) 1 MG IJ SOLR: 1.0000 mg | Freq: Once | INTRAMUSCULAR | Status: DC

## 2014-10-27 MED ORDER — PRAVASTATIN SODIUM 20 MG PO TABS
20.0000 mg | ORAL_TABLET | Freq: Every day | ORAL | Status: DC
  Filled 2014-10-27: qty 1

## 2014-10-27 MED ORDER — HEPARIN SODIUM (PORCINE) 5000 UNIT/ML IJ SOLN
5000.0000 [IU] | Freq: Three times a day (TID) | INTRAMUSCULAR | Status: DC
  Administered 2014-10-27: 5000 [IU] via SUBCUTANEOUS
  Filled 2014-10-27 (×4): qty 1

## 2014-10-27 MED ORDER — CARBIDOPA-LEVODOPA 25-100 MG PO TABS
1.0000 | ORAL_TABLET | Freq: Three times a day (TID) | ORAL | Status: DC
  Filled 2014-10-27 (×2): qty 1

## 2014-10-27 MED ORDER — SODIUM CHLORIDE 0.9 % IJ SOLN
3.0000 mL | Freq: Two times a day (BID) | INTRAMUSCULAR | Status: DC
  Administered 2014-10-27 (×2): 3 mL via INTRAVENOUS

## 2014-10-27 MED ORDER — SERTRALINE HCL 25 MG PO TABS
25.0000 mg | ORAL_TABLET | Freq: Every day | ORAL | Status: DC
  Filled 2014-10-27: qty 1

## 2014-10-27 MED ORDER — PANTOPRAZOLE SODIUM 40 MG PO TBEC
40.0000 mg | DELAYED_RELEASE_TABLET | Freq: Two times a day (BID) | ORAL | Status: DC
  Administered 2014-10-27: 40 mg via ORAL
  Filled 2014-10-27: qty 1

## 2014-10-27 MED ORDER — VITAMIN B-12 1000 MCG PO TABS
1000.0000 ug | ORAL_TABLET | Freq: Every morning | ORAL | Status: DC
  Administered 2014-10-27: 1000 ug via ORAL
  Filled 2014-10-27: qty 1

## 2014-10-27 NOTE — Progress Notes (Signed)
Patient arrived to the floor. Patient in no distress, no pain, or dizziness. Patient Vitals taken, placed on tele monitor, and oriented to unit. Assessment done. Hx taken. Fall risk education done. Patient bed alarm placed. Call bell within reach. Will continue to monitor.  Domingo Dimes RN

## 2014-10-27 NOTE — Progress Notes (Signed)
Subjective:  Patient was seen and examined this morning. He denies any complaints or any recurrent syncopal or pre-syncopal episodes. He denies any chest pain, shortness of breath, lightheadedness, or confusion. Patient would like to go home.  Objective: Vital signs in last 24 hours: Filed Vitals:   10/26/14 2345 10/27/14 0000 10/27/14 0048 10/27/14 0645  BP: 145/82 122/76 143/77 136/83  Pulse: 64 69 66 59  Temp:   97.9 F (36.6 C) 98.6 F (37 C)  TempSrc:   Oral Oral  Resp: 18 11 18 18   Height:   5\' 6"  (1.676 m)   Weight:   146 lb 2.6 oz (66.3 kg)   SpO2: 98% 95% 94% 98%   General: Vital signs reviewed.  Patient is well-developed and well-nourished, in no acute distress and cooperative with exam.  HEENT: No carotid bruit present.  Cardiovascular: RRR, 3/6 systolic ejection murmur best auscultated over the right sternal border. Pulmonary/Chest: Clear to auscultation bilaterally, no wheezes, rales, or rhonchi. Abdominal: Soft, non-tender, non-distended, BS + Extremities: No lower extremity edema bilaterally Skin: Warm, dry and intact.  Psychiatric: Normal mood and affect. speech and behavior is normal. Cognition and memory are normal.   Neurologic Exam:   Mental Status: Alert, oriented, thought content appropriate.  Speech fluent without evidence of aphasia. Able to follow 3 step commands without difficulty.  Cranial Nerves:   II: Visual fields grossly intact.  III/IV/VI: Extraocular movements intact.  Pupils reactive bilaterally.  V/VII: Smile symmetric. Facial light touch sensation normal bilaterally.  VIII: Grossly intact.  XI: Bilateral shoulder shrug normal.  XII: Midline tongue extension normal.  Motor:  5/5 on the right with normal tone and bulk. 4-5/5 LUE. 5/5 LLE.  Sensory:  Light touch intact throughout, bilaterally  DTRs: 2+ and symmetric throughout  Cerebellar: Shuffling gait and station.    Lab Results: Basic Metabolic Panel:  Recent Labs Lab  10/26/14 1339 10/26/14 1409 10/27/14 0842  NA 135 136 138  K 4.1 4.0 4.8  CL 100 98 102  CO2 25  --  31  GLUCOSE 97 94 105*  BUN 12 15 11   CREATININE 0.75 0.70 0.76  CALCIUM 9.3  --  9.7   CBC:  Recent Labs Lab 10/26/14 1339 10/26/14 1409 10/27/14 0842  WBC 6.0  --  5.9  HGB 14.0 14.6 14.7  HCT 41.3 43.0 43.5  MCV 92.2  --  91.8  PLT 193  --  195   Cardiac Enzymes:  Recent Labs Lab 10/27/14 0453  TROPONINI <0.03   CBG:  Recent Labs Lab 10/27/14 0639  GLUCAP 87   Studies/Results: Dg Chest 2 View  10/26/2014   CLINICAL DATA:  Acute onset of loss of consciousness. Initial encounter.  EXAM: CHEST  2 VIEW  COMPARISON:  Chest radiograph performed 08/13/2013, and CTA of the chest performed 08/12/2008  FINDINGS: The lungs are well-aerated and clear. There is no evidence of focal opacification, pleural effusion or pneumothorax.  The heart is normal in size; the mediastinal contour is within normal limits. No acute osseous abnormalities are seen.  IMPRESSION: No acute cardiopulmonary process seen.   Electronically Signed   By: Garald Balding M.D.   On: 10/26/2014 19:53   Medications:  I have reviewed the patient's current medications. Prior to Admission:  Prescriptions prior to admission  Medication Sig Dispense Refill Last Dose  . carbidopa-levodopa (SINEMET IR) 25-100 MG per tablet Take 1 tablet by mouth 3 (three) times daily. 270 tablet 0 10/25/2014 at Unknown time  .  mesalamine (APRISO) 0.375 G 24 hr capsule Take 750 mg by mouth every morning. Takes 2 tablets each morning   10/25/2014 at Unknown time  . pantoprazole (PROTONIX) 40 MG tablet Take 40 mg by mouth 2 (two) times daily.   10/25/2014 at Unknown time  . pravastatin (PRAVACHOL) 20 MG tablet Take 20 mg by mouth daily.   10/25/2014 at Unknown time  . sertraline (ZOLOFT) 25 MG tablet Take 25 mg by mouth daily.    10/25/2014 at Unknown time  . valsartan (DIOVAN) 160 MG tablet Take 80 mg by mouth every other day.    10/25/2014 at Unknown time  . vitamin B-12 (CYANOCOBALAMIN) 1000 MCG tablet Take 1,000 mcg by mouth every morning.   10/25/2014 at Unknown time   Scheduled Meds: . heparin  5,000 Units Subcutaneous 3 times per day  . pantoprazole  40 mg Oral BID  . pravastatin  20 mg Oral q1800  . sodium chloride  3 mL Intravenous Q12H  . vitamin B-12  1,000 mcg Oral q morning - 10a   Continuous Infusions:  PRN Meds:. Assessment/Plan: Active Problems:   Benign paroxysmal positional vertigo   Essential hypertension   Syncope   Parkinsonism  Syncope: No recurrent syncope or pre-syncopal symptoms. Doubt seizure or stroke given unremarkable physical exam with absence of focal neurological deficits, no prior seizure history and no symptoms consistent with a seizure. 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 Patient was seen by PT who recommended outpatient PT but no further recommendations. Patient would like to go home. He will follow up closely with his PCP and Neurologist on discharge.  -Continue pravastatin 20 mg -Echo pending -Monitor on telemetry -Neuro checks Q4H -Follow up with Neurology on discharge -Follow up with PCP on discharge -Resume home medications on discharge  Parkinson's Disease: Diagnosed within the last 1 year. Patient has shuffling gait. He is on Sinemet 25-100 milligrams 3 times daily and vitamin B12 1000 g. -Continue vitamin B12 -Resume Sinemet on discharge  Hypertension: Patient is on valsartan 160 mg every other day at home. Orthostatics were negative. -Resume home medication on discharge.   Depression: Stable. Patient is on Zoloft 25 mg daily at home. -Continue home medications on discharge  Ulcerative Colitis: Stable. On mesalamine 750 mg daily at home.  -Continue Protonix 40 mg twice daily -Continue home medications on discharge  H/o of melanoma of the left arm status post wide  excision and sentinel biopsy: Last seen by Dr. Juliann Mule on 03/01/2014. On 10/25/2013, surgery was done with lymph node and sentinel biopsy of left axilla which were not notable for metastatic disease. -Stable  FEN:  -Diet: Heart healthy  DVT prophylaxis: Heparin SQ TID  CODE STATUS: DNR/DNI -Defer to wife Edmonia Lynch [[phone]] if patients lacks decision-making capacity -Confirmed with patient on admission  Dispo: Disposition is deferred at this time, awaiting improvement of current medical problems.  Anticipated discharge in approximately 1-2 day(s).   The patient does have a current PCP Dione Housekeeper, MD) and does not need an [facility] hospital follow-up appointment after discharge.  The patient does not have transportation limitations that hinder transportation to clinic appointments.  .Services Needed at time of discharge: Y = Yes, Blank = No PT:   OT:   RN:   Equipment:   Other:     LOS: 1 day   Osa Craver, DO PGY-1 Internal Medicine Resident Pager # [phone] 10/27/2014 12:13 PM

## 2014-10-27 NOTE — Progress Notes (Signed)
Utilization review completed.  

## 2014-10-27 NOTE — Progress Notes (Signed)
OT Cancellation Note  Patient Details Name: [name]. MRN: [id number] DOB: 1940/09/05   Cancelled Treatment:    Reason Eval/Treat Not Completed: OT screened, no needs identified, will sign off. Per PT, pt has no OT needs. Acute OT to sign off.   Villa Herb M 10/27/2014, 11:34 AM

## 2014-10-27 NOTE — Discharge Instructions (Signed)
Thank you for allowing [place] to be involved in your healthcare while you were hospitalized at [facility].   Please note that there have been changes to your home medications.  --> PLEASE LOOK AT YOUR DISCHARGE MEDICATION LIST FOR DETAILS.  Please call your PCP if you have any questions or concerns, or any difficulty getting any of your medications.  Please return to the ER if you have worsening of your symptoms or new severe symptoms arise.  Please follow up with your primary care physician and your neurologist on discharge. Continue all of your medications as previously prescribed.  Syncope Syncope is a medical term for fainting or passing out. This means you lose consciousness and drop to the ground. People are generally unconscious for less than 5 minutes. You may have some muscle twitches for up to 15 seconds before waking up and returning to normal. Syncope occurs more often in older adults, but it can happen to anyone. While most causes of syncope are not dangerous, syncope can be a sign of a serious medical problem. It is important to seek medical care.  CAUSES  Syncope is caused by a sudden drop in blood flow to the brain. The specific cause is often not determined. Factors that can bring on syncope include:  Taking medicines that lower blood pressure.  Sudden changes in posture, such as standing up quickly.  Taking more medicine than prescribed.  Standing in one place for too long.  Seizure disorders.  Dehydration and excessive exposure to heat.  Low blood sugar (hypoglycemia).  Straining to have a bowel movement.  Heart disease, irregular heartbeat, or other circulatory problems.  Fear, emotional distress, seeing blood, or severe pain. SYMPTOMS  Right before fainting, you may:  Feel dizzy or light-headed.  Feel nauseous.  See all white or all black in your field of vision.  Have cold, clammy skin. DIAGNOSIS  Your health care provider will ask  about your symptoms, perform a physical exam, and perform an electrocardiogram (ECG) to record the electrical activity of your heart. Your health care provider may also perform other heart or blood tests to determine the cause of your syncope which may include:  Transthoracic echocardiogram (TTE). During echocardiography, sound waves are used to evaluate how blood flows through your heart.  Transesophageal echocardiogram (TEE).  Cardiac monitoring. This allows your health care provider to monitor your heart rate and rhythm in real time.  Holter monitor. This is a portable device that records your heartbeat and can help diagnose heart arrhythmias. It allows your health care provider to track your heart activity for several days, if needed.  Stress tests by exercise or by giving medicine that makes the heart beat faster. TREATMENT  In most cases, no treatment is needed. Depending on the cause of your syncope, your health care provider may recommend changing or stopping some of your medicines. HOME CARE INSTRUCTIONS  Have someone stay with you until you feel stable.  Do not drive, use machinery, or play sports until your health care provider says it is okay.  Keep all follow-up appointments as directed by your health care provider.  Lie down right away if you start feeling like you might faint. Breathe deeply and steadily. Wait until all the symptoms have passed.  Drink enough fluids to keep your urine clear or pale yellow.  If you are taking blood pressure or heart medicine, get up slowly and take several minutes to sit and then stand. This can reduce dizziness.  SEEK IMMEDIATE MEDICAL CARE IF:   You have a severe headache.  You have unusual pain in the chest, abdomen, or back.  You are bleeding from your mouth or rectum, or you have black or tarry stool.  You have an irregular or very fast heartbeat.  You have pain with breathing.  You have repeated fainting or seizure-like  jerking during an episode.  You faint when sitting or lying down.  You have confusion.  You have trouble walking.  You have severe weakness.  You have vision problems. If you fainted, call your local emergency services (911 in [place] Do not drive yourself to the hospital.  MAKE SURE YOU:  Understand these instructions.  Will watch your condition.  Will get help right away if you are not doing well or get worse. Document Released: 07/28/2005 Document Revised: 08/02/2013 Document Reviewed: 09/26/2011 [facility] Patient Information 2015 [place], [place]. This information is not intended to replace advice given to you by your health care provider. Make sure you discuss any questions you have with your health care provider.

## 2014-10-27 NOTE — Progress Notes (Signed)
  Echocardiogram 2D Echocardiogram has been performed.  [name] 10/27/2014, 4:07 PM

## 2014-10-27 NOTE — Progress Notes (Signed)
Discharged to home with family office visits in place teaching done  

## 2014-10-27 NOTE — Discharge Summary (Signed)
Name: [name]. MRN: [id number] DOB: 1940-10-24 74 y.o. PCP: Dione Housekeeper, MD  Date of Admission: 10/26/2014  6:45 PM Date of Discharge: 10/27/2014 Attending Physician: Dr. Beryle Beams  Discharge Diagnosis:  Principal Problem:   Syncope Active Problems:   Benign paroxysmal positional vertigo   Essential hypertension   Parkinsonism   Ulcerative colitis  Discharge Medications:   Medication List    TAKE these medications        carbidopa-levodopa 25-100 MG per tablet  Commonly known as:  SINEMET IR  Take 1 tablet by mouth 3 (three) times daily.     mesalamine 0.375 G 24 hr capsule  Commonly known as:  APRISO  Take 750 mg by mouth every morning. Takes 2 tablets each morning     pantoprazole 40 MG tablet  Commonly known as:  PROTONIX  Take 40 mg by mouth 2 (two) times daily.     pravastatin 20 MG tablet  Commonly known as:  PRAVACHOL  Take 20 mg by mouth daily.     valsartan 160 MG tablet  Commonly known as:  DIOVAN  Take 80 mg by mouth every other day.     vitamin B-12 1000 MCG tablet  Commonly known as:  CYANOCOBALAMIN  Take 1,000 mcg by mouth every morning.     ZOLOFT 25 MG tablet  Generic drug:  sertraline  Take 25 mg by mouth daily.        Disposition and follow-up:   GregoryJamarion Eulan [name]. was discharged from [facility] in Good condition.  At the hospital follow up visit please address:  1.  Syncope: Please assess any recurrent symptoms  2.  Labs / imaging needed at time of follow-up: None  3.  Pending labs/ test needing follow-up: None  Follow-up Appointments: Follow-up Information    Follow up with Sherrie Mustache, MD On 11/06/2014.   Specialty:  Family Medicine   Why:  appointment at 2:00pm with PCP for f/u for hospitalization for syncopal episode.    Contact information:   [place] [address] [phone]       Follow up with Star Age, MD On 11/23/2014.   Specialties:  Neurology, Radiology   Why:  f/u for hospitilzation for syncopal episode.    Contact information:   [address] [place] [address] [phone]       Discharge Instructions: Discharge Instructions    Diet - low sodium heart healthy    Complete by:  As directed      Increase activity slowly    Complete by:  As directed            Consultations:  None  Procedures Performed:  Dg Chest 2 View  10/26/2014   CLINICAL DATA:  Acute onset of loss of consciousness. Initial encounter.  EXAM: CHEST  2 VIEW  COMPARISON:  Chest radiograph performed 08/13/2013, and CTA of the chest performed 08/12/2008  FINDINGS: The lungs are well-aerated and clear. There is no evidence of focal opacification, pleural effusion or pneumothorax.  The heart is normal in size; the mediastinal contour is within normal limits. No acute osseous abnormalities are seen.  IMPRESSION: No acute cardiopulmonary process seen.   Electronically Signed   By: Garald Balding M.D.   On: 10/26/2014 19:53   2D Echo:   - Left ventricle: The cavity size was normal. There was mild focal basal hypertrophy of the septum. Systolic function was normal. The estimated ejection fraction was in the range  of 55% to 60%. - Aortic valve: There was trivial regurgitation. - Aorta: Some acoustic shadowing normal caliber.  Admission HPI: [name] is a 74 year old gentleman with history of syncope, parkinsonism, history of melanoma of the left arm status post wide excision and sentinel biopsy, hypertension, depression, ulcerative colitis who presents with syncope. At the time of interview, his friend Mortimer Fries was present and contributed to the interview.  Earlier today, he was going to restaurants when he collapsed on the floor. He does not remember what happened thereafter except for going to the ambulance. Eyewitness accounts of the entire incident as reported by [place] describes him as "did not appear well." Symptoms he reports immediately after the  incident include weakness though no urinary incontinence or tongue biting. At baseline, he came in with the walker and describes he has intermittent dizziness which she describes as "the room is spinning" and a "cautious gait "which is from his parkinsonism for which he follows with Dr. Star Age [[facility]] and and is on Sinemet. He was last seen on 05/23/2014. He has had prior hospitalizations for similar episodes, most recently being in January 2015 at which time he broke 3 ribs after sustaining a fall, though has not had any episodes since then. At that hospitalization, he was taken off Aricept as it was thought to be contributory. Otherwise, he denies any changes to his vision, headache, numbness or tingling, changes to his medication, missing any of his medications. He was at home with his wife and spends his time playing the piano. In the ED, initial troponin was negative and orthostatic vital signs were within normal limits.  Hospital Course by problem list: Principal Problem:   Syncope Active Problems:   Benign paroxysmal positional vertigo   Essential hypertension   Parkinsonism   Ulcerative colitis   Syncope: Differential included cardiogenic, neurogenic, or orthostatic. Neurogenic was likely given his baseline parkinsonism and features consistent with episodes prompting prior hospitalizations at which time no other causes were elicited. His medications may also be contributory as Sinemet and mesalamine both have syncope reported as a side effect. Head CT 08/12/2013 was notable for chronic noncommunicating hydrocephalus though unsure if this may have worsened however no neurologic deficits were noted on physical exam. Other neurologic causes, like CVA, appear less likely given the absence of focal deficits on neurologic exam. Carotid Dopplers 02/26/2013 no formal for mild to moderate soft plaque in the right common carotid artery and at the origin the internal carotid artery though  0-39% stenosis bilaterally. Absence of urinary incontinence or tongue biting is not suggestive of a seizure. EEG 02/27/2013 with no findings of epileptiform activity. EKG looked otherwise unchanged from prior hospitalization. Echo 02/26/2013 with EF 55-60% and mild mitral and tricuspid regurgitation, pulmonary arterial peak pressure 31 mmHg. Repeat Echo was similar to prior. Orthostatic vital signs were negative in the ED. Patient denied any recent illness which may have made him dehydrated. Patient was monitored overnight and reported no recurrent syncope or pre-syncopal symptoms. 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 Patient was seen by PT who recommended outpatient PT but no further recommendations. Patient would like to go home. He will follow up closely with his PCP and Neurologist on discharge. If syncope persists, consider referral to cardiology for holter monitor.   Parkinson's Disease: Diagnosed within the last 1 year. Patient's physical exam is notable for a shuffling gait. He  is on Sinemet 25-100 milligrams 3 times daily and vitamin B12 1000 g. Home medications were resumed on discharge.   Hypertension: Patient is on valsartan 160 mg every other day at home. Orthostatics were negative. Hhome medications were resumed on discharge.   Depression: Stable. Patient is on Zoloft 25 mg daily at home.  Ulcerative Colitis: Stable. On mesalamine 750 mg daily at home.   H/o of melanoma of the left arm status post wide excision and sentinel biopsy: Last seen by Dr. Juliann Mule on 03/01/2014. On 10/25/2013, surgery was done with lymph node and sentinel biopsy of left axilla which were not notable for metastatic disease.  Discharge Vitals:   BP 136/83 mmHg  Pulse 59  Temp(Src) 98.6 F (37 C) (Oral)  Resp 18  Ht 5\' 6"  (1.676 m)  Wt 146 lb 2.6 oz (66.3 kg)  BMI 23.60 kg/m2  SpO2 98%  Discharge  Labs:  Results for orders placed or performed during the hospital encounter of 10/26/14 (from the past 24 hour(s))  Troponin I     Status: None   Collection Time: 10/27/14  4:53 AM  Result Value Ref Range   Troponin I <0.03 <0.031 ng/mL  Glucose, capillary     Status: None   Collection Time: 10/27/14  6:39 AM  Result Value Ref Range   Glucose-Capillary 87 70 - 99 mg/dL  CBC     Status: None   Collection Time: 10/27/14  8:42 AM  Result Value Ref Range   WBC 5.9 4.0 - 10.5 K/uL   RBC 4.74 4.22 - 5.81 MIL/uL   Hemoglobin 14.7 13.0 - 17.0 g/dL   HCT 43.5 39.0 - 52.0 %   MCV 91.8 78.0 - 100.0 fL   MCH 31.0 26.0 - 34.0 pg   MCHC 33.8 30.0 - 36.0 g/dL   RDW 13.0 11.5 - 15.5 %   Platelets 195 150 - 400 K/uL  Basic metabolic panel     Status: Abnormal   Collection Time: 10/27/14  8:42 AM  Result Value Ref Range   Sodium 138 135 - 145 mmol/L   Potassium 4.8 3.5 - 5.1 mmol/L   Chloride 102 96 - 112 mmol/L   CO2 31 19 - 32 mmol/L   Glucose, Bld 105 (H) 70 - 99 mg/dL   BUN 11 6 - 23 mg/dL   Creatinine, Ser 0.76 0.50 - 1.35 mg/dL   Calcium 9.7 8.4 - 10.5 mg/dL   GFR calc non Af Amer 89 (L) >90 mL/min   GFR calc Af Amer >90 >90 mL/min   Anion gap 5 5 - 15    Signed: Osa Craver, DO PGY-1 Internal Medicine Resident Pager # [phone] 10/27/2014 8:38 PM

## 2014-10-27 NOTE — Progress Notes (Signed)
Active Problems:   Benign paroxysmal positional vertigo   Essential hypertension   Syncope   Parkinsonism      Code Status Orders        Start     Ordered   10/27/14 0047  Do not attempt resuscitation (DNR)   Continuous    Question Answer Comment  In the event of cardiac or respiratory ARREST Do not call a "code blue"   In the event of cardiac or respiratory ARREST Do not perform Intubation, CPR, defibrillation or ACLS   In the event of cardiac or respiratory ARREST Use medication by any route, position, wound care, and other measures to relive pain and suffering. May use oxygen, suction and manual treatment of airway obstruction as needed for comfort.      10/27/14 0046      Length of Stay (days):1   SUBJECTIVE/24 HOUR EVENTS: 74 y.o. 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 Patient states he has had these episodes in the past, but that he hasn't had one in nearly a year before last night since his doctors d/c his Aricept which they thought may be contributing to his syncope. Patient has had cardiac and neurological workup in the past with CT, EEG, Echocardiogram and carotid dopplers. He denies any associated symptoms with the syncopal episodes other than some precipitating dizziness. Patient denies SOB/chest pain/palpiations/diarphoresis/headache/blurred vision/changes in memory or cognition/nausea/vomiting/diarrhea/weakness. He also denies any recent trauma or illness and states the only new med he has started since discontinuing Aricept is Lialda for ulcerative colitis. He has no changes in appetite and states he stays well hydrated.    OBJECTIVE: Filed Vitals:   10/26/14 2345 10/27/14 0000 10/27/14 0048 10/27/14 0645  BP: 145/82 122/76 143/77 136/83  Pulse: 64 69 66 59  Temp:   97.9 F (36.6  C) 98.6 F (37 C)  TempSrc:   Oral Oral  Resp: 18 11 18 18   Height:   5\' 6"  (1.676 m)   Weight:   66.3 kg (146 lb 2.6 oz)   SpO2: 98% 95% 94% 98%   No intake or output data in the 24 hours ending 10/27/14 1149  Intake/Output last 3 shifts:    Allergies  Allergen Reactions  . Lexapro [Escitalopram Oxalate] Other (See Comments)    Unknown: reaction occurred some time ago and pt cannot remember what it was  . Tetracyclines & Related Other (See Comments)    Unknown: Reaction occurred some time ago and pt cannot remember what it was     Medications: Scheduled Meds: . heparin  5,000 Units Subcutaneous 3 times per day  . pantoprazole  40 mg Oral BID  . pravastatin  20 mg Oral q1800  . sodium chloride  3 mL Intravenous Q12H  . vitamin B-12  1,000 mcg Oral q morning - 10a   Continuous Infusions:  PRN Meds:.  Physical Exam: GEN: NAD, AAOx4, WDWN HEENT: MMM, EOMI, PERRLA, b/l sclera anicteric, no conjunctival injection, no lymphadenopathy  NECK: Supple, no JVD, no carotid bruits, no thyromegaly  CV: RRR, S1S2nl, no murmurs/rubs/gallops, PMI nondisplaced PULM: clear to auscultation b/l, no rales/rhonchi/wheezes, nl percussion ABD: normal/active bowel sounds, soft, ND/NT, no rebound, no guarding, no CVA tenderness, no hepatosplenomegaly EXT: no cyanosis, clubbing or edema  NEURO: CN II-XII intact, mild weakness of the upper left extremity when compared to the right on  flexion and extension. Slow, shuffling gait. No cogwheel rigidity.  PSYCH: nl affect, nl speech MSK: nl ROM, no joint swelling or erythema    Labs: Recent Labs     10/26/14  1339  10/26/14  1409  10/27/14  0842  HGB  14.0  14.6  14.7  HCT  41.3  43.0  43.5  PLT  193   --   195  NA  135  136  138  K  4.1  4.0  4.8  CL  100  98  102  CO2  25   --   31  BUN  12  15  11   CREATININE  0.75  0.70  0.76  CALCIUM  9.3   --   9.7    CBC Latest Ref Rng 10/27/2014 10/26/2014 10/26/2014  WBC 4.0 - 10.5 K/uL 5.9 - 6.0   Hemoglobin 13.0 - 17.0 g/dL 14.7 14.6 14.0  Hematocrit 39.0 - 52.0 % 43.5 43.0 41.3  Platelets 150 - 400 K/uL 195 - 193    BMP Latest Ref Rng 10/27/2014 10/26/2014 10/26/2014  Glucose 70 - 99 mg/dL 105(H) 94 97  BUN 6 - 23 mg/dL 11 15 12   Creatinine 0.50 - 1.35 mg/dL 0.76 0.70 0.75  Sodium 135 - 145 mmol/L 138 136 135  Potassium 3.5 - 5.1 mmol/L 4.8 4.0 4.1  Chloride 96 - 112 mmol/L 102 98 100  CO2 19 - 32 mmol/L 31 - 25  Calcium 8.4 - 10.5 mg/dL 9.7 - 9.3    CMP Latest Ref Rng 10/27/2014 10/26/2014 10/26/2014  Glucose 70 - 99 mg/dL 105(H) 94 97  BUN 6 - 23 mg/dL 11 15 12   Creatinine 0.50 - 1.35 mg/dL 0.76 0.70 0.75  Sodium 135 - 145 mmol/L 138 136 135  Potassium 3.5 - 5.1 mmol/L 4.8 4.0 4.1  Chloride 96 - 112 mmol/L 102 98 100  CO2 19 - 32 mmol/L 31 - 25  Calcium 8.4 - 10.5 mg/dL 9.7 - 9.3  Total Protein 6.4 - 8.3 g/dL - - -  Total Bilirubin 0.20 - 1.20 mg/dL - - -  Alkaline Phos 40 - 150 U/L - - -  AST 5 - 34 U/L - - -  ALT 0 - 55 U/L - - -    Recent Labs  10/26/14 2013  TROPIPOC 0.00   Images: Chest X-ray (10/26/2014) - no acute cardiopulmonary process. Heart is normal size and mediastinal contour is within normal limits. No opacification, pleural efusion or pneumothorax.   Echocardiogram scheduled for this afternoon.   Medications, Vitals, Labs, and Images reviewed.  ASSESSMENT AND PLAN: Mr. Brownlow is a 74 y/o male with a PMH significant for Parkinsonism, syncope, noncommunicating hydrocephalus and melanoma who presented to ED after a syncopal episode with no associated SOB/chest pain/nausea/vomiting/headache/neurologic deficits. Patient's chest x-ray, troponin, labs and physical exam were unremarkable with the exception of a slow, shuffling gait which requires him to use a walker. My differential diagnosis for this patient's syncope includes neurologic and caridogenic etiologies. The patient's last echocardiogram and carotid doppler were performed in 02/2013 and were  unremarkable. Patient also denies palpitations, orthostatic hypotension or any cardiogenic symptoms precipitating syncopal episodes. Given his lack of symptoms, especially orthostatic hypotension in the ED, and a normal echocardiogram within the last 2 years, a cardiogenic cause is less likely but still under clinical suspicion. Will repeat an echo to monitor possible progression. The patient's last head CT w/o contrast and EEG were performed in 02/2013 and were unremarkable with  the exception of his already diagnosed hydrocephalus, making seizure disorder (no evidence of tongue biting on exam and no history of incontinence), bleed or mass less likely causes. His melanoma was found to be non-metastatic post sential node biopsy and he is being followed by dermatology - brain mets from metastatic melanoma less likely. Given his baseline parkinsonism with no other obvious causes of syncope, a neurogenic etiology (possibly due to autonomic failure) is most likely. He has no neurologic deficits on exam, making a CVA less likely. He denied recent illness/diarrhea/vomiting and states he eats and drinks fluids regularly, making dehydration a less likely cause. He was discontinued off of Aricept due to possible medication side effect of syncope; however he is still on Sinemet and mesalamine which can also cause syncope.  -Hold Sinemet, mesalamine and Valsartan until discharged.  -Continue pravastatin 20 mg -echocardiogram scheduled for this afternoon before discharge -Monitor on telemetry -PT has walked with the patient in the halls.  -consider tilt-table test for assessing autonomic dysfunction. -counsel patient on staying hydrated, possibly using compression stockings to keep BP stable.    Parkinsonism and gait disorder:  -Continue vitamin B12 1000 g daily.  -Hold Sinemet - restart at discharge. -F/u appointment with Dr. Rexene Alberts   Hypertension:  -Hold Valsartan 160mg  in the setting of his syncope - restart  once discharged as prescribed (160mg  every other day).   Depression:  -Continue Zoloft 25mg   Ulcerative colitis: Last colonoscopy in 10/23/2003. Should be scheduled for a repeat screening colonoscopy now that he is 10 years out. Consult PCP. Patient stated he has not been suffering from diarrhea or abdominal pain/symptoms.  -Continue Protonix 40 mg bid -Hold mesalamine 750mg  - restart after discharge.   Melanoma of left arm: post surgical excision.  -Continue following. Advocate proper sun safety and f/u with dermatology/oncology.    DVT prophylaxis: Heparin  Dispo: Patient has follow-up appointments with Dr. Rexene Alberts and Dr. Edrick Oh. Will fax over records to Dr. Murrell Redden office as requested.

## 2014-10-27 NOTE — Evaluation (Addendum)
Physical Therapy Evaluation/ Discharge Patient Details Name: [name]. MRN: [id number] DOB: [birth year] Today's Date: 10/27/2014   History of Present Illness  [name] is a 74 year old gentleman with history of syncope, parkinsonism, history of melanoma of the left arm status post wide excision and sentinel biopsy, hypertension, depression, ulcerative colitis who presents with syncope  Clinical Impression  [name] is very pleasant and does not describe symptoms of vertigo. He states he previously had vertigo but was treated with positional maneuvers and corrected. [name] states no symptoms of spinning prior to syncope. Negative Dix Hallpike and supine head roll bilaterally. [name] currently at baseline without further acute therapy needs but recommend OPPT for continued work with postural stability and gait.     Follow Up Recommendations Outpatient [name]    Equipment Recommendations  None recommended by [name]    Recommendations for Other Services       Precautions / Restrictions Precautions Precautions: Fall Restrictions Weight Bearing Restrictions: No      Mobility  Bed Mobility Overal bed mobility: Modified Independent                Transfers Overall transfer level: Modified independent               General transfer comment: increased time  Ambulation/Gait Ambulation/Gait assistance: Min guard Ambulation Distance (Feet): 300 Feet Assistive device: Rolling walker (2 wheeled) Gait Pattern/deviations: Shuffle;Trunk flexed   Gait velocity interpretation: at or above normal speed for age/gender General Gait Details: [name] with parkinsonian gait with shuffle steps, flexed trunk and neck with inability to perform and maintain significant head turns with gait. [name] with need for BM end of session and developed increasing stutter steps with anxiety for toilet. cues throughout for posture, position in RW and increased stride  Stairs            Wheelchair Mobility     Modified Rankin (Stroke Patients Only)       Balance Overall balance assessment: Needs assistance   Sitting balance-Leahy Scale: Good       Standing balance-Leahy Scale: Fair                               Pertinent Vitals/Pain Pain Assessment: No/denies pain    Home Living Family/patient expects to be discharged to:: Private residence Living Arrangements: Spouse/significant other Available Help at Discharge: Family;Available 24 hours/day Type of Home: House Home Access: Stairs to enter Entrance Stairs-Rails: Psychiatric nurse of Steps: 2 Home Layout: One level Home Equipment: Walker - 2 wheels;Cane - quad;Shower seat      Prior Function Level of Independence: Independent with assistive device(s)         Comments: ambulates with RW     Hand Dominance        Extremity/Trunk Assessment   Upper Extremity Assessment: Generalized weakness           Lower Extremity Assessment: Generalized weakness      Cervical / Trunk Assessment: Kyphotic  Communication   Communication: No difficulties  Cognition Arousal/Alertness: Awake/alert Behavior During Therapy: WFL for tasks assessed/performed Overall Cognitive Status: Within Functional Limits for tasks assessed                      General Comments      Exercises        Assessment/Plan    [name] Assessment Patent does not need any further [name] services  [name] Diagnosis  Abnormality of gait   [name] Problem List    [name] Treatment Interventions     [name] Goals (Current goals can be found in the Care Plan section) Acute Rehab [name] Goals [name] Goal Formulation: All assessment and education complete, DC therapy    Frequency     Barriers to discharge        Co-evaluation               End of Session   Activity Tolerance: Patient tolerated treatment well Patient left: in chair;with call bell/phone within reach Nurse Communication: Mobility status         Time:  0100-7121 [name] Time Calculation (min) (ACUTE ONLY): 34 min   Charges:   [name] Evaluation $Initial [name] Evaluation Tier I: 1 Procedure [name] Treatments $Gait Training: 8-22 mins   [name] G CodesMelford Aase 10/27/2014, 11:15 AM Elwyn Reach, [place]

## 2014-10-30 ENCOUNTER — Telehealth: Payer: Self-pay

## 2014-10-30 NOTE — Telephone Encounter (Signed)
Talked to patient and his wife to try and schedule with a NP. They felt like they would rather see the doctor and kept his appt on the 14th/.

## 2014-10-30 NOTE — Telephone Encounter (Signed)
-----   Message from Benay Pillow sent at 10/27/2014 11:46 AM EDT ----- Regarding: [name] DOB 2041/07/09 [facility] called to advise that patient was seen in the ER last night and was to be released today. Patient was diagnosed with Syncope and the hospital had altered a few of his medications which might be causing patient's current problem. The patient needs a soon appointment to discuss medications but nothing is available so patient will call back next week to see if any cancellations.

## 2014-11-02 ENCOUNTER — Encounter: Payer: Self-pay | Admitting: *Deleted

## 2014-11-02 ENCOUNTER — Telehealth: Payer: Self-pay | Admitting: *Deleted

## 2014-11-02 ENCOUNTER — Telehealth: Payer: Self-pay | Admitting: Neurology

## 2014-11-02 NOTE — Telephone Encounter (Signed)
Left VMmessage to return call to rescheduled appointment from 11/23/14 at 3:00. Template change.

## 2014-11-02 NOTE — Telephone Encounter (Signed)
Patient wife calling to r/s patient appt due to template change. The patient has been in the hospital recently and would not like to schedule to far out. Please call the patient of the wife.

## 2014-11-03 NOTE — Telephone Encounter (Signed)
I spoke with wife and we changed appt.

## 2014-11-08 ENCOUNTER — Telehealth: Payer: Self-pay | Admitting: Cardiology

## 2014-11-08 NOTE — Telephone Encounter (Signed)
Received records from [place] for appointment with Dr Percival Spanish ([facility]) on 11/22/14.  Records given to [facility] (for Dr Hochrein's schedule on 11/22/14.  lp

## 2014-11-22 ENCOUNTER — Ambulatory Visit (INDEPENDENT_AMBULATORY_CARE_PROVIDER_SITE_OTHER): Payer: Medicare Other | Admitting: Cardiology

## 2014-11-22 ENCOUNTER — Encounter: Payer: Self-pay | Admitting: Cardiology

## 2014-11-22 VITALS — BP 132/90 | HR 68 | Ht 66.0 in | Wt 154.0 lb

## 2014-11-22 DIAGNOSIS — R42 Dizziness and giddiness: Secondary | ICD-10-CM

## 2014-11-22 NOTE — Progress Notes (Signed)
Cardiology Office Note   Date:  11/22/2014   ID:  Gregory Miner., DOB 1941-01-06, MRN [id number]  PCP:  Sherrie Mustache, MD  Cardiologist:   Minus Breeding, MD   No chief complaint on file.     History of Present Illness: Gregory Kenyon. is a 74 y.o. male who presents for follow-up syncope. He was hospitalized last month because of this. I have reviewed these emergency room and hospital records. I reviewed previous records prior to this. He most recently saw Dr. [name] 10 2014. This is his first visit with me. He has not had any prior cardiac etiology for his events. He was thought to have some orthostasis as the cause of his syncope.  Recent workup did include an echocardiogram which was essentially unremarkable except for some mild basilar hypertrophy.  He has previously had multiple other workups including carotids and EEGs.   He reports that this happens when   He standing.he will lie down or sit down. He might still passed out very briefly and he does this.  He does not feel palpitations, presyncope or syncope with this. He does however have prodrome that things are going to go black. He does not have any chest pressure, neck or arm discomfort. He does get occasional orthostatic symptoms. He does not have shortness of breath, PND or orthopnea. He has no weight gain or edema. 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  Of note he was not orthostatic in the office today.   Past Medical History  Diagnosis Date  . Benign paroxysmal positional vertigo   . Obstructive hydrocephalus   . Abnormality of gait     takes Carbidopa-Levodopa  . Basal cell carcinoma     "primarily on my face; I've had a bunch" (02/25/2013)  . 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 Ulcerative colitis     takes Apriso daily  . Unspecified essential hypertension     takes Diovan daily  . Hyperlipidemia     is on med but unsure of name-to bring  day of surgery  . Asthma     was on allergy shots and no problems 4-85yrs ago  . Arthritis     left knee  . Joint swelling     left knee  . Reflux     takes Protonix daily  . History of colon polyps   . History of blood transfusion     no abnormal reaction noted  . Cataracts, bilateral     immature  . Complication of anesthesia     "I have aqueductal stenosis" (02/25/2013); concerns re: general anesthesia b/c he didn't devleop gait disturbance until the post-operative period following prostectomy in 2009    Past Surgical History  Procedure Laterality Date  . Suprapubic prostatectomy  02/21/2008  . Basal cell carcinoma excision Left 2000's    points to nasal fold  (02/25/2013)  . Colonoscopy    . Melanoma excision with sentinel lymph node biopsy Left 10/25/2013    Procedure: WIDE EXCISION MELANOMA LEFT ARM WITH LEFT AXILLARY  SENTINEL LYMPH NODE BIOPSY;  Surgeon: Joyice Faster. Cornett, MD;  Location: [facility];  Service: General;  Laterality: Left;     Current Outpatient Prescriptions  Medication Sig Dispense Refill  . carbidopa-levodopa (SINEMET IR) 25-100 MG per tablet Take 1 tablet by mouth 3 (three) times daily. 270 tablet 0  . mesalamine (LIALDA) 1.2 G EC  tablet Take 1.2 g by mouth daily with breakfast.     . pantoprazole (PROTONIX) 40 MG tablet Take 40 mg by mouth 2 (two) times daily.    . pravastatin (PRAVACHOL) 20 MG tablet Take 20 mg by mouth daily.    . sertraline (ZOLOFT) 25 MG tablet Take 25 mg by mouth daily.     . valsartan (DIOVAN) 160 MG tablet Take 80 mg by mouth every other day.    . vitamin B-12 (CYANOCOBALAMIN) 1000 MCG tablet Take 1,000 mcg by mouth every morning.     No current facility-administered medications for this visit.    Allergies:   Lexapro and Tetracyclines & related     ROS:  Please see the history of present illness.   Otherwise, review of systems are positive for none.   All other systems are reviewed and negative.    PHYSICAL EXAM: VS:  BP  132/90 mmHg  Pulse 68  Ht 5\' 6"  (1.676 m)  Wt 154 lb (69.854 kg)  BMI 24.87 kg/m2 , BMI Body mass index is 24.87 kg/(m^2). GENERAL:  Well appearing HEENT:  Pupils equal round and reactive, fundi not visualized, oral mucosa unremarkable NECK:  No jugular venous distention, waveform within normal limits, carotid upstroke brisk and symmetric, no bruits, no thyromegaly LYMPHATICS:  No cervical, inguinal adenopathy LUNGS:  Clear to auscultation bilaterally BACK:  No CVA tenderness CHEST:  Unremarkable HEART:  PMI not displaced or sustained,S1 and S2 within normal limits, no S3, no S4, no clicks, no rubs, no murmurs ABD:  Flat, positive bowel sounds normal in frequency in pitch, no bruits, no rebound, no guarding, no midline pulsatile mass, no hepatomegaly, no splenomegaly EXT:  2 plus pulses throughout, no edema, no cyanosis no clubbing SKIN:  No rashes no nodules NEURO:  Cranial nerves II through XII grossly intact, motor grossly intact throughout PSYCH:  Cognitively intact, oriented to person place and time    EKG:  EKG is not ordered today. The ekg ordered today demonstrates normal sinus rhythm, rate 60, axis within normal limits, intervals within normal limits, no acute ST-T wave changes. 10/25/14   Recent Labs: 03/07/2014: ALT 16 10/27/2014: BUN 11; Creatinine 0.76; Hemoglobin 14.7; Platelets 195; Potassium 4.8; Sodium 138     Wt Readings from Last 3 Encounters:  11/22/14 154 lb (69.854 kg)  10/27/14 146 lb 2.6 oz (66.3 kg)  05/23/14 155 lb 8 oz (70.534 kg)      Other studies Reviewed: Additional studies/ records that were reviewed today include: Hospital records. Review of the above records demonstrates:  Please see elsewhere in the note.     ASSESSMENT AND PLAN:  SYNCOPE: I suspect this is related to orthostasis and may be related to his neurologic problem. He does now have knee-high compression stockings and he will start to wear these. I can't give him any volume  expanders or other because of his supine hypertension. He might however do well with an abdominal binder continues to have symptoms. On the outside chance that this is a dysrhythmia he will wear a 21 day event monitor [name]. will need a 21 day event monitor.  The patients symptoms necessitate an event monitor.  The symptoms are too infrequent to be identified on a Holter monitor.    HTN:   As above he will remain on the meds as listed. If he continues to have significant symptoms and I think his blood pressure related I might actually have to reduce the dose of Diovan.  Current medicines are reviewed at length with the patient today.  The patient does not have concerns regarding medicines.  The following changes have been made:  no change  Labs/ tests ordered today include: Event monitor  No orders of the defined types were placed in this encounter.     Disposition:   FU with me in two months.     Signed, Minus Breeding, MD  11/22/2014 2:48 PM    [facility]

## 2014-11-22 NOTE — Patient Instructions (Signed)
The current medical regimen is effective;  continue present plan and medications.  Your physician has recommended that you wear an event monitor. Event monitors are medical devices that record the heart's electrical activity. Doctors most often [place] these monitors to diagnose arrhythmias. Arrhythmias are problems with the speed or rhythm of the heartbeat. The monitor is a small, portable device. You can wear one while you do your normal daily activities. This is usually used to diagnose what is causing palpitations/syncope (passing out).  Follow up in 2 months with Dr Percival Spanish in [place].  Thank you for choosing [facility]!!

## 2014-11-23 ENCOUNTER — Ambulatory Visit (INDEPENDENT_AMBULATORY_CARE_PROVIDER_SITE_OTHER): Payer: Medicare Other | Admitting: *Deleted

## 2014-11-23 ENCOUNTER — Ambulatory Visit: Payer: Medicare Other | Admitting: Neurology

## 2014-11-23 DIAGNOSIS — R42 Dizziness and giddiness: Secondary | ICD-10-CM | POA: Diagnosis not present

## 2014-11-23 NOTE — Patient Instructions (Signed)
Cardiac Event Monitoring A cardiac event monitor is a small recording device used to help detect abnormal heart rhythms (arrhythmias). The monitor is used to record heart rhythm when noticeable symptoms such as the following occur:  Fast heartbeats (palpitations), such as heart racing or fluttering.  Dizziness.  Fainting or light-headedness.  Unexplained weakness. The monitor is wired to two electrodes placed on your chest. Electrodes are flat, sticky disks that attach to your skin. The monitor can be worn for up to 30 days. You will wear the monitor at all times, except when bathing.  HOW TO USE YOUR CARDIAC EVENT MONITOR A technician will prepare your chest for the electrode placement. The technician will show you how to place the electrodes, how to work the monitor, and how to replace the batteries. Take time to practice using the monitor before you leave the office. Make sure you understand how to send the information from the monitor to your health care provider. This requires a telephone with a landline, not a cell phone. You need to:  Wear your monitor at all times, except when you are in water:  Do not get the monitor wet.  Take the monitor off when bathing. Do not swim or use a hot tub with it on.  Keep your skin clean. Do not put body lotion or moisturizer on your chest.  Change the electrodes daily or any time they stop sticking to your skin. You might need to use tape to keep them on.  It is possible that your skin under the electrodes could become irritated. To keep this from happening, try to put the electrodes in slightly different places on your chest. However, they must remain in the area under your left breast and in the upper right section of your chest.  Make sure the monitor is safely clipped to your clothing or in a location close to your body that your health care provider recommends.  Press the button to record when you feel symptoms of heart trouble, such as  dizziness, weakness, light-headedness, palpitations, thumping, shortness of breath, unexplained weakness, or a fluttering or racing heart. The monitor is always on and records what happened slightly before you pressed the button, so do not worry about being too late to get good information.  Keep a diary of your activities, such as walking, doing chores, and taking medicine. It is especially important to note what you were doing when you pushed the button to record your symptoms. This will help your health care provider determine what might be contributing to your symptoms. The information stored in your monitor will be reviewed by your health care provider alongside your diary entries.  Send the recorded information as recommended by your health care provider. It is important to understand that it will take some time for your health care provider to process the results.  Change the batteries as recommended by your health care provider. SEEK IMMEDIATE MEDICAL CARE IF:   You have chest pain.  You have extreme difficulty breathing or shortness of breath.  You develop a very fast heartbeat that persists.  You develop dizziness that does not go away.  You faint or constantly feel you are about to faint. Document Released: 05/06/2008 Document Revised: 12/12/2013 Document Reviewed: 01/24/2013 [employer] Patient Information 2015 [employer]. This information is not intended to replace advice given to you by your health care provider. Make sure you discuss any questions you have with your health care provider.  

## 2014-11-23 NOTE — Progress Notes (Signed)
Patient came in for a king of hearts per Dr. Percival Spanish. King of hearts was placed and instructions given to patient. Patient verbalized understanding and was aware that he could call [facility] or [place] with any questions or concerns. Patient is due to return the monitor on 12/24/2014.

## 2014-11-27 ENCOUNTER — Ambulatory Visit: Payer: Self-pay | Admitting: Neurology

## 2014-11-27 ENCOUNTER — Telehealth: Payer: Self-pay | Admitting: Cardiology

## 2014-11-27 NOTE — Telephone Encounter (Signed)
Route all Dr. Rosezella Florida call's to [place]

## 2014-11-27 NOTE — Telephone Encounter (Signed)
Pt has heart monitor--wife calling re incidents that he is having--pls call

## 2014-11-27 NOTE — Telephone Encounter (Signed)
Wife is stating that the monitor keeps "beeping due to incidents" but that it is alerting very frequently and she is worried that something is happening and they would like to know if there is infact any cause for concern. I let wife know that I do not have any way to review [employer] monitors but will call [facility]. And see if there is any recordings and give her a call back with further instructions.

## 2014-11-28 NOTE — Telephone Encounter (Signed)
Since the monitor was placed at [facility] I will call them to inquire about any abnormal rhythms that have been reported.  I have not received any information at this point that [employer] has reported any abnormalities.  Wife reports pt has not been having any s/s when the monitor is "beeping" but she wants to know what is going on when it is.  Advised I will call [facility] to see if they have received any information.  Spoke with Sharee Pimple at [facility].  She will have any monitor strips scanned into [employer] for review.

## 2014-11-28 NOTE — Telephone Encounter (Signed)
Monitor strips that where obtained thusfar from [facility] have been scanned into [employer].  Will forward to Dr Percival Spanish for review.

## 2014-12-06 ENCOUNTER — Telehealth: Payer: Self-pay | Admitting: *Deleted

## 2014-12-06 NOTE — Telephone Encounter (Signed)
Recd [employer] Event Report for Sinus Bradycardia/PAC(s) event from 12/02/14 at 06:47a, 09:57a, 09:47p - Asymptomatic Recordings. Reviewed by DOD, Dr. Acie Fredrickson.

## 2014-12-07 NOTE — Telephone Encounter (Signed)
[facility] to review - please see next phone note as well

## 2014-12-11 ENCOUNTER — Telehealth: Payer: Self-pay | Admitting: Cardiology

## 2014-12-11 NOTE — Telephone Encounter (Signed)
Returned call to patient's wife she stated husband is wearing a monitor put on at [employer] office.Stated monitor beeped this morning around 4:00 am.Stated husband felt fine.Stated [employer] [facility] was called and a transmission was sent.Stated she wanted to know how long is husband suppose to wear monitor.Advised 21 day event monitor.Stated she will mail back to [facility] 12/16/14.

## 2014-12-11 NOTE — Telephone Encounter (Signed)
Pt called in stating that she would like to speak with Dr. Rosezella Florida nurse about the monitor that the pt is wearing . Please call back  Thanks

## 2014-12-27 ENCOUNTER — Telehealth: Payer: Self-pay | Admitting: Cardiology

## 2014-12-27 ENCOUNTER — Ambulatory Visit (INDEPENDENT_AMBULATORY_CARE_PROVIDER_SITE_OTHER): Payer: Medicare Other | Admitting: Neurology

## 2014-12-27 ENCOUNTER — Encounter: Payer: Self-pay | Admitting: Neurology

## 2014-12-27 VITALS — BP 140/78 | HR 78 | Resp 16

## 2014-12-27 DIAGNOSIS — G20 Parkinson's disease: Secondary | ICD-10-CM

## 2014-12-27 DIAGNOSIS — R269 Unspecified abnormalities of gait and mobility: Secondary | ICD-10-CM

## 2014-12-27 DIAGNOSIS — G911 Obstructive hydrocephalus: Secondary | ICD-10-CM | POA: Diagnosis not present

## 2014-12-27 DIAGNOSIS — R55 Syncope and collapse: Secondary | ICD-10-CM

## 2014-12-27 DIAGNOSIS — R9402 Abnormal brain scan: Secondary | ICD-10-CM

## 2014-12-27 MED ORDER — CARBIDOPA-LEVODOPA 25-100 MG PO TABS: 1.0000 | ORAL_TABLET | Freq: Three times a day (TID) | ORAL | Status: DC

## 2014-12-27 NOTE — Telephone Encounter (Signed)
Returned call to patient's for clarification on the "tapes". Per wife, she is requesting the report of the monitor patient wore in April and had put on at [facility]. Appears from [employer] some monitor strips had been received? Monitor is [employer]. Explained to wife that I cannot locate the monitor in [employer] nor do I have access to [place].   Will defer to [place], LPN to assist patient

## 2014-12-27 NOTE — Patient Instructions (Addendum)
You can call Dr. Rosezella Florida office for a report on your heart monitor.   Please use your walker at all times. Use your compression stockings. Drink plenty of water, about 6 glasses a day.   We will continue with the Sinemet at the current dose.   We will do another EEG (brain wave test) and call you with the test result.

## 2014-12-27 NOTE — Progress Notes (Signed)
Subjective:    Patient ID: [name]. is a 74 y.o. male.  HPI     Interim history:   [name] is a 74 year old gentleman with an underlying medical history of paroxysmal vertigo, hypertension, hyperlipidemia, obstructive hydrocephalus, history of syncope, memory loss, cervical spine degenerative disease, and melanoma of the skin, who presents for followup consultation after his recent hospitalization for syncope. The patient is accompanied by a friend [name], today. I have been following him for a gait disorder and mild parkinsonism and last saw him on 05/23/2014, at which time he and his wife felt that his gait was worse. He had finished physical therapy but was not necessarily following the home exercises that he was given. He had lost some weight. He had not fallen thankfully. He had not had any pre-syncopal spells or lightheadedness or dizziness or vertigo at the time. He was recently started on Zoloft. He was using Ensure. He was using his walker at all times. His wife had taken a fall and injured herself which caused a lot of stress for him.   In the interim, he was hospitalized from 10/26/2014 through 10/27/2014. Today, I reviewed his hospital records including discharge summary, labs and test results. He had lost consciousness while outside at a restaurant. He was not looking well and collapsed. He had had previous syncopal spells and hospitalizations with workup for this. Echocardiogram from 10/27/2014 showed EF of 55-60%, trivial aortic regurgitation, mild focal basal hypertrophy of the septum. Chest x-ray 2 views on 10/26/2014 showed no acute cardiopulmonary disease. He had no orthostatic blood pressure values.  Today, 12/27/2014: He is able to provide his own history. His friend provides details about the events on 10/26/2014 at which time they were at a restaurant. The patient reported that he was not feeling well. Mr. Dahlia Client and another gentleman helped him sit down on a bench  that was nearby. He was found to be certainly very stiff with extended upper body and extended upper arms. He had a little bit of twitching. The whole thing lasted a few seconds. The patient then became aware of his environment. He was a hot day and they were up and about for quite a while. He was seen by Dr. Percival Spanish in cardiology on 11/22/2014 and I reviewed the note. He was advised to use compression stockings. He was advised to have a 30 day cardiac monitor.  I looked for the report on the cardiac monitor. I do see that he had a few events of sinus bradycardia. A full report is pending. He has an appointment with his cardiologist in July. He felt he had no events during the cardiac monitor. He denies any recent issues with lightheadedness. He is currently not driving. He's using his compression stockings up to the thighs.   Previously:   I saw him on 01/04/2014, at which time he reported no recent syncopal spells since January 2015. He was no longer driving. He had physical therapy with good results in the past. He had new hearing aids. He was mobilizing with a walker but therapy was able to get to a point where he was only using a cane. He still endorse that Sinemet was helpful despite not having idiopathic Parkinson's disease most likely. He had been seeing his dermatologist regularly and most recently saw him on 03/07/2014 with a recommendation for a six-month followup. Thankfully one lymph node that was removed in the context of his melanoma surgery was negative for cancer. I referred him  back to physical therapy. He had decided not to pursue a DaT scan. I kept his medication the same.  I saw him on 08/29/2013, at which time I felt his gait disorder was multifactorial in nature. I felt he had some parkinsonism but no idiopathic Parkinson's disease. He had a recent head CT which showed stable findings. His wife was wondering if he needed a shunt place. I offered a referral to neurosurgery but they  decline. We talked about potentially doing a DaT scan but he indicated that he would like to think about it. I provided him with information material the scan. He was in the process of tapering off Aricept. He had a syncopal spell and fell with rib fractures sustained. He felt that he had benefited from levodopa therapy and therefore I kept him on generic Sinemet. In the interim, we had to reschedule his June appointment. We offered him a sooner appointment with our nurse practitioner which he declined, as he did not wish to see a nurse practitioner.   I saw him on 03/03/2013 after a recent hospitalization for syncope. He had seen by cardiology and also saw [name] at [facility] neurology. I reviewed notes from [name] as well as Dr. [name] in cardiology. [name] saw the patient on 04/04/13 and felt, he had some parkinsonism but not idiopathic Parkinson's disease. She noted signs of peripheral neuropathy and added further labs. She referred him for speech therapy. His blood work showed a normal IFE, normal RPR, normal folate level, and a low normal B12 level and she recommended starting B12 vitamin. He saw Dr. [name] on 03/14/2013 who stopped his beta blocker and did not find any Cardiologic etiology of his syncope. He was hospitalized from 08/12/2013 through 08/15/2013 after a syncopal event resulting in a fall during which he sustained rib fractures. He was kept on Sinemet. He has not had any falls or syncopal since then. He was also advised that his syncope may come from Aricept and he was on 1/2 pill qHS for one week, then 1/2 pill qod for one week. He had a CT chest on 08/12/13 and a CXR on 08/13/13, which I reviewed the reports of. He had a [place] on 08/12/13, which I reviewed through the PACS system. I also reviewed the report: Chronic noncommunicating hydrocephalus. No acute intracranial findings.   He was evaluated for hearing loss and was supposed to get hearing aids. He had an appointment for cognitive testing, but  he wanted to delay it till after his hearing aids. He finished speech therapy.   I first met him on 02/04/2013 with a history of parkinsonism and gait disorder. He previously followed with Dr. Morene Antu and was last seen by him on 10/05/2012, at which time Dr. Erling Cruz felt that his gait was improved on levodopa therapy. 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 He started having difficulty with his walking after prostate surgery in 2009. 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 He had an EEG in December 2011 showing transient slowing in the right central temporal region. He has had memory loss. 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 Since he had done fairly well on levodopa therapy, and I kept him on the same dose. The patient was admitted to [facility] on 02/25/2013 and discharged on 02/27/2013 due to syncope and collapse. The  patient has had 2 similar episodes in the past. He was admitted with telemetry monitoring and it showed no dysrhythmias. EEG was normal in the awake and drowsy state. His carotid Doppler study showed mild to moderate soft plaque in the common carotid artery and origin of the internal carotid artery on the right and minimal plaque on the left. Vertebral artery flow was antegrade. Cardiac echo showed mild AS and trivial aortic and tricuspid regurgitation as well as mitral regurgitation. Ejection fraction was preserved at 55%. No wall motion abnormalities were seen. He had serial cardiac enzymes which were negative. He was started on Lopressor. Head CT from 02/25/2013 showed no acute intracranial abnormalities, brain atrophy, similar appearance of chronic hydrocephalus.    His Past Medical History Is Significant For: Past Medical History  Diagnosis Date  . Benign paroxysmal positional vertigo   . Obstructive hydrocephalus   .  Cautious gait     takes Carbidopa-Levodopa  . Basal cell carcinoma     "primarily on my face; I've had a bunch" (02/25/2013)  . 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 Ulcerative colitis     takes Apriso daily  . Unspecified essential hypertension     takes Diovan daily  . Hyperlipidemia     is on med but unsure of name-to bring day of surgery  . Asthma     was on allergy shots and no problems 4-82yr ago  . Arthritis     left knee  . Joint swelling     left knee  . Reflux     takes Protonix daily  . History of colon polyps   . History of blood transfusion     no abnormal reaction noted  . Cataracts, bilateral     immature  . Complication of anesthesia     "I have aqueductal stenosis" (02/25/2013); concerns re: general anesthesia b/c he didn't devleop gait disturbance until the post-operative period following prostectomy in 2009    His Past Surgical History Is Significant For: Past Surgical History  Procedure Laterality Date  . Suprapubic prostatectomy  02/21/2008    BPH  . Basal cell carcinoma excision Left 2000's    points to nasal fold  (02/25/2013)  . Colonoscopy    . Melanoma excision with sentinel lymph node biopsy Left 10/25/2013    Procedure: WIDE EXCISION MELANOMA LEFT ARM WITH LEFT AXILLARY  SENTINEL LYMPH NODE BIOPSY;  Surgeon: TJoyice Faster Cornett, MD;  [place]  Service: General;  Laterality: Left;    His Family History Is Significant For: Family History  Problem Relation Age of Onset  . Heart failure Father   . CAD Father 617   Died age 73 . Dementia Mother   . Heart attack Paternal Uncle     His Social History Is Significant For: History   Social History  . Marital Status: Married    Spouse Name: N/A  . Number of Children: N/A  . Years of Education: N/A   Occupational History  . PEnvironmental education officer- retired     Social History Main Topics  . Smoking status: Never Smoker   . Smokeless tobacco: Never Used      Comment: 02/25/2013 "quit smoking > 20 yr ago"  . Alcohol Use: 0.0 oz/week    0 Standard drinks or equivalent per week     Comment: glass of wine occasionally  . Drug Use: No  . Sexual Activity: Yes  Other Topics Concern  . None   Social History Narrative   Right handed. 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       His Allergies Are:  Allergies  Allergen Reactions  . Lexapro [Escitalopram Oxalate] Other (See Comments)    Unknown: reaction occurred some time ago and pt cannot remember what it was  . Tetracyclines & Related Other (See Comments)    Unknown: Reaction occurred some time ago and pt cannot remember what it was   :   His Current Medications Are:  Outpatient Encounter Prescriptions as of 12/27/2014  Medication Sig  . carbidopa-levodopa (SINEMET IR) 25-100 MG per tablet Take 1 tablet by mouth 3 (three) times daily.  . mesalamine (LIALDA) 1.2 G EC tablet Take 1.2 g by mouth daily with breakfast.   . pantoprazole (PROTONIX) 40 MG tablet Take 40 mg by mouth 2 (two) times daily.  . pravastatin (PRAVACHOL) 20 MG tablet Take 20 mg by mouth daily.  . sertraline (ZOLOFT) 25 MG tablet Take 25 mg by mouth daily.   . valsartan (DIOVAN) 80 MG tablet   . vitamin B-12 (CYANOCOBALAMIN) 1000 MCG tablet Take 1,000 mcg by mouth every morning.  . [DISCONTINUED] valsartan (DIOVAN) 160 MG tablet Take 80 mg by mouth every other day.   No facility-administered encounter medications on file as of 12/27/2014.  :  Review of Systems:  Out of a complete 14 point review of systems, all are reviewed and negative with the exception of these symptoms as listed below:   Review of Systems  Neurological: Positive for syncope.  All other systems reviewed and are negative.  Objective:  Neurologic Exam  Physical Exam Physical Examination:   Filed Vitals:   12/27/14 1257  BP: 140/78  Pulse: 78  Resp: 16    General Examination: The  patient is a very pleasant 74 y.o. male in no acute distress. He denies lightheadedness upon standing.  HEENT: Macrocephalic, atraumatic, pupils are equal, round and reactive to light and accommodation. Extraocular tracking shows mild saccadic breakdown without nystagmus noted. There is no limitation to gaze. There is no decrease in eye blink rate. Hearing is good. Face is symmetric with no facial masking and normal facial sensation. There is no lip, neck or jaw tremor. Neck is not rigid with intact passive ROM. There are no carotid bruits on auscultation. Oropharynx exam reveals mild mouth dryness. No significant airway crowding is noted. Mallampati is class II. Tongue protrudes centrally and palate elevates symmetrically. There is no drooling.   Chest: is clear to auscultation without wheezing, rhonchi or crackles noted.  Heart: sounds are regular and normal without murmurs, rubs or gallops noted.   Abdomen: is soft, non-tender and non-distended with normal bowel sounds appreciated on auscultation.  Extremities: There is no pitting edema in the distal lower extremities bilaterally. Pedal pulses are intact. He is wearing compression stocking up to thighs bilaterally.   Skin: is warm and dry with no trophic changes noted. Age-related changes are noted on the skin.   Musculoskeletal: exam reveals: Left knee swelling and genu varus on the left.   Neurologically:  Mental status: The patient is awake and alert, paying good attention. He is able to completely provide the history. His friend also gives some details. He is oriented to: person, place, time/date, situation, day of week, month of year and year. His memory, attention, language and knowledge are fairly well intact. There is no aphasia, agnosia,  apraxia or anomia. There is a no degree of bradyphrenia. Speech is mildly hypophonic with no dysarthria noted. Mood is congruent and affect is normal.   Cranial nerves are as described above under  HEENT exam. In addition, shoulder shrug is normal with equal shoulder height noted.  Motor exam: Normal bulk, and strength for age is noted. There are no dyskinesias noted. Tone is not rigid with absence of cogwheeling in the extremities. There is overall no bradykinesia. There is no drift or rebound. There is no tremor.   Romberg is negative, but he has to stand wide-based.  Reflexes are 1+ in the upper extremities and 1+ in the lower extremities. Fine motor skills exam: Fine motor skills are fairly well preserved in the upper extremities and mildly impaired in the lower extremities. Cerebellar testing shows no dysmetria or intention tremor on finger to nose testing. Heel to shin is unremarkable bilaterally. There is no truncal or gait ataxia.   Sensory exam is intact to light touch in the upper and lower extremities.   Gait, station and balance: He stands up with mild difficulty and has to push himself up. He stands mildly wide-based. He is bowlegged especially in the left, unchanged. He has no significant start hesitation but has some stuttersteps, no frank freezing, especially with turns. He walks with his 2 wheeled walker and maneuvers it fairly well, but is overall is a little bit more insecure than last time. His balance is mildly impaired. He does not have a typical parkinsonian shuffle. His posture is mildly stooped but unchanged. He turns in 3-4 steps. He does have slight difficulty turning. He walks slowly.  Assessment and Plan:   In summary, Teshawn Moan is a very pleasant 74 year old male with a history of hydrocephalus, likely congenital and gait disorder which is probably multifactorial with mild evidence of parkinsonism, which has been stable. He had a recent hospitalization for a syncopal spell. In the past, he was taken off of Aricept after a syncopal spell. He is on low-dose Diovan at this time. He is using compression stockings and had a cardiac monitor for 30 days which was  completed last week. The full report is pending and he also has an appointment with his cardiologist in July. His physical exam is stable for the most part and his neurological exam is stable. Nevertheless, his walking is a little bit more insecure. He is advised to drink more water. He is advised to use his walker at all times. He is advised to use his compression stockings. Furthermore, he had an EEG in July 2014 and I would like to go ahead and repeated. We will call him with the test results. I do not see a pressing reason to restrict his driving permanently your long-term. I think if his EEG is normal, and his cardiac monitor is unremarkable, and he feels good he can try to ease back into driving. He was asking about his driving and I suggested that he also run this by his cardiologist and his primary care physician. He has always felt better when taking Sinemet. I again advised him that he may have some mild parkinsonism but no frank or idiopathic Parkinson's disease. He decided not to pursue the DaT scan in the past when we discussed this. He was diagnosed with melanoma of his left arm and had surgical removal of his melanoma. He has recently seen his dermatologist and we talked about his melanoma history and taking Sinemet at the same time.  He wanted to continue with Sinemet. He still insists that he does better with it and does not wish to come off of it. I suggested we continue with the current dose which is not high. He will take 1 pill 3 times a day and I renewed his prescription. I will see him back routinely in 6 months, and the interim, we will be in touch with him about his EEG results. He is encouraged to call with any other interim questions or concerns. I answered all his questions today and he was in agreement.  I spent 25 minutes in total face-to-face time with the patient, more than 50% of which was spent in counseling and coordination of care, reviewing test results, reviewing medication and  discussing or reviewing the diagnosis of syncope and collapse, parkinsonism, the prognosis and treatment options.

## 2014-12-27 NOTE — Telephone Encounter (Signed)
His neurologist told him to call and see if Dr Warren Lacy had gotten back all the tapes. Also wants to know if he is able to make a determination?

## 2015-01-01 NOTE — Telephone Encounter (Signed)
Pt's wife is calling wanting to know what the results were to his test . Please call  Thanks

## 2015-01-02 NOTE — Telephone Encounter (Signed)
Returned call to patient's wife.She stated husband wore a 21 day event monitor 11/23/14.Stated husband had monitor put on at [facility] office.Stated she called [place] office yesterday and they already faxed monitor report to [place].Stated they would like to know results of monitor.Spoke to [place] at [place] office.She will refax monitor report to [facility] office.

## 2015-01-02 NOTE — Telephone Encounter (Signed)
[name] is calling because [facility] told them that they faxed over the results to Dr. Percival Spanish , but did not say when they faxed them over . They are just trying to get the results of the monitor. Please call   Thanks

## 2015-01-03 ENCOUNTER — Ambulatory Visit (INDEPENDENT_AMBULATORY_CARE_PROVIDER_SITE_OTHER): Payer: Medicare Other | Admitting: Neurology

## 2015-01-03 DIAGNOSIS — R55 Syncope and collapse: Secondary | ICD-10-CM | POA: Diagnosis not present

## 2015-01-03 NOTE — Procedures (Signed)
    History:  [name] is a 74 year old gentleman with a history of hydrocephalus, parkinsonism and episodes of syncope. The patient is being evaluated for the blackout event.  This is a routine EEG. No skull defects are noted. Medications include Sinemet, Lialda, Protonix, pravastatin, Zoloft, Diovan, and vitamin B12.   EEG classification: Normal awake  Description of the recording: The background rhythms of this recording consists of a fairly well modulated medium amplitude alpha rhythm of 9 Hz that is reactive to eye opening and closure. As the record progresses, the patient appears to remain in the waking state throughout the recording. Photic stimulation was not performed. Hyperventilation was performed, resulting in a minimal buildup of the background rhythm activities without significant slowing seen. At no time during the recording does there appear to be evidence of spike or spike wave discharges or evidence of focal slowing. EKG monitor shows no evidence of cardiac rhythm abnormalities with a heart rate of 60.  Impression: This is a normal EEG recording in the waking state. No evidence of ictal or interictal discharges are seen.

## 2015-01-03 NOTE — Progress Notes (Signed)

## 2015-01-04 ENCOUNTER — Telehealth: Payer: Self-pay

## 2015-01-04 ENCOUNTER — Telehealth: Payer: Self-pay | Admitting: Cardiology

## 2015-01-04 ENCOUNTER — Telehealth: Payer: Self-pay | Admitting: Neurology

## 2015-01-04 NOTE — Telephone Encounter (Signed)
See result note from 01/03/15.

## 2015-01-04 NOTE — Telephone Encounter (Signed)
Patient's wife is calling to get EEG results for the patient.

## 2015-01-04 NOTE — Telephone Encounter (Signed)
-----   Message from Star Age, MD sent at 01/03/2015  5:57 PM EDT ----- Please call and advise the patient that the EEG or brain wave test we performed was reported as normal in the awake state. We checked for abnormal electrical discharges in the brain waves and the report suggested normal findings. No further action is required on this test at this time. Please remind patient to keep any upcoming appointments or tests and to call us with any interim questions, concerns, problems or updates. Thanks,  Star Age, MD, PhD

## 2015-01-04 NOTE — Telephone Encounter (Signed)
Duplicate note, spoke with wife and she is aware

## 2015-01-04 NOTE — Telephone Encounter (Signed)
I spoke to wife (on current HIPPA that is scanned in) she is aware that EEG is normal. No further questions at this point.

## 2015-01-04 NOTE — Telephone Encounter (Signed)
Pt's wife is calling in to get the results to the pt's heart monitor . Please call  Thanks

## 2015-01-04 NOTE — Telephone Encounter (Signed)
Pt. Informed about his monitor results

## 2015-02-14 ENCOUNTER — Ambulatory Visit (INDEPENDENT_AMBULATORY_CARE_PROVIDER_SITE_OTHER): Payer: Medicare Other | Admitting: Cardiology

## 2015-02-14 ENCOUNTER — Encounter: Payer: Self-pay | Admitting: Cardiology

## 2015-02-14 VITALS — BP 122/82 | HR 62 | Ht 67.0 in | Wt 159.0 lb

## 2015-02-14 DIAGNOSIS — I10 Essential (primary) hypertension: Secondary | ICD-10-CM | POA: Diagnosis not present

## 2015-02-14 DIAGNOSIS — R55 Syncope and collapse: Secondary | ICD-10-CM

## 2015-02-14 NOTE — Progress Notes (Signed)
Cardiology Office Note   Date:  02/14/2015   ID:  Gregory Miner., DOB 1941/06/20, MRN [id number]  PCP:  Sherrie Mustache, MD  Cardiologist:   Minus Breeding, MD   Chief Complaint  Patient presents with  . Loss of Consciousness      History of Present Illness: [name]. is a 74 y.o. male who presents for follow-up syncope. He was hospitalized earlier this year.   He was thought to have some orthostasis as the cause of his syncope.  Recent workup did include an echocardiogram which was essentially unremarkable except for some mild basilar hypertrophy.  He has previously had multiple other workups including carotids and EEGs.  Since he was last seen he has been wearing compression stockings and he has had no further syncope.  The patient denies any new symptoms such as chest discomfort, neck or arm discomfort. There has been no new shortness of breath, PND or orthopnea. There have been no reported palpitations or presyncope.  Of note he wore an event monitor and was not found to have any significant bradycardia arrhythmia or other events.   Past Medical History  Diagnosis Date  . Benign paroxysmal positional vertigo   . Obstructive hydrocephalus   . Cautious gait     takes Carbidopa-Levodopa  . Basal cell carcinoma     "primarily on my face; I've had a bunch" (02/25/2013)  . 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 Ulcerative colitis     takes Apriso daily  . Unspecified essential hypertension     takes Diovan daily  . Hyperlipidemia     is on med but unsure of name-to bring day of surgery  . Asthma     was on allergy shots and no problems 4-75yrs ago  . Arthritis     left knee  . Joint swelling     left knee  . Reflux     takes Protonix daily  . History of colon polyps   . History of blood transfusion     no abnormal reaction noted  . Cataracts, bilateral     immature  . 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 Suprapubic prostatectomy  02/21/2008    BPH  . Basal cell carcinoma excision Left 2000's    points to nasal fold  (02/25/2013)  . Colonoscopy    . Melanoma excision with sentinel lymph node biopsy Left 10/25/2013    Procedure: WIDE EXCISION MELANOMA LEFT ARM WITH LEFT AXILLARY  SENTINEL LYMPH NODE BIOPSY;  Surgeon: Joyice Faster. Cornett, MD;  Location: [facility];  Service: General;  Laterality: Left;     Current Outpatient Prescriptions  Medication Sig Dispense Refill  . carbidopa-levodopa (SINEMET IR) 25-100 MG per tablet Take 1 tablet by mouth 3 (three) times daily. 270 tablet 3  . mesalamine (LIALDA) 1.2 G EC tablet Take 1.2 g by mouth daily with breakfast.     . pantoprazole (PROTONIX) 40 MG tablet Take 40 mg by mouth 2 (two) times daily.    . pravastatin (PRAVACHOL) 20 MG tablet Take 20 mg by mouth daily.    . sertraline (ZOLOFT) 25 MG tablet Take 25 mg by mouth daily.     . valsartan (DIOVAN) 40 MG  tablet Take 40 mg by mouth daily.    . vitamin B-12 (CYANOCOBALAMIN) 1000 MCG tablet Take 1,000 mcg by mouth every morning.     No current facility-administered medications for this visit.    Allergies:   Lexapro and Tetracyclines & related     ROS:  Please see the history of present illness.   Otherwise, review of systems are positive for none.   All other systems are reviewed and negative.    PHYSICAL EXAM: VS:  BP 122/82 mmHg  Pulse 62  Ht 5\' 7"  (1.702 m)  Wt 159 lb (72.122 kg)  BMI 24.90 kg/m2 , BMI Body mass index is 24.9 kg/(m^2). GENERAL:  Well appearing HEENT:  Pupils equal round and reactive, fundi not visualized, oral mucosa unremarkable NECK:  No jugular venous distention, waveform within normal limits, carotid upstroke brisk and symmetric, no bruits, no  thyromegaly LYMPHATICS:  No cervical, inguinal adenopathy LUNGS:  Clear to auscultation bilaterally BACK:  No CVA tenderness CHEST:  Unremarkable HEART:  PMI not displaced or sustained,S1 and S2 within normal limits, no S3, no S4, no clicks, no rubs, brief apical systolic murmur heard at the right upper sternal border, no diastolic murmurs ABD:  Flat, positive bowel sounds normal in frequency in pitch, no bruits, no rebound, no guarding, no midline pulsatile mass, no hepatomegaly, no splenomegaly EXT:  2 plus pulses throughout, no edema, no cyanosis no clubbing   EKG:  EKG is ordered today. Sinus rhythm, rate 62, axis within normal limits, intervals within normal limits, no acute ST-T wave changes.   Recent Labs: 03/07/2014: ALT 16 10/27/2014: BUN 11; Creatinine, Ser 0.76; Hemoglobin 14.7; Platelets 195; Potassium 4.8; Sodium 138     Wt Readings from Last 3 Encounters:  02/14/15 159 lb (72.122 kg)  11/22/14 154 lb (69.854 kg)  10/27/14 146 lb 2.6 oz (66.3 kg)      Other studies Reviewed: Additional studies/ records that were reviewed today include: Hospital records. Review of the above records demonstrates:  Please see elsewhere in the note.     ASSESSMENT AND PLAN:  SYNCOPE:  He is not having any further syncope or presyncope with his thigh-high compression stockings. He will continue with this. He needs no further workup no further med changes. He'll let me know if symptoms recur.  HTN:   He will continue the meds as listed.    Current medicines are reviewed at length with the patient today.  The patient does not have concerns regarding medicines.  The following changes have been made:  no change  Labs/ tests ordered today include: None    Disposition:   FU with me in 12 months   Signed, Minus Breeding, MD  02/14/2015 2:04 PM    [place]

## 2015-02-14 NOTE — Patient Instructions (Signed)
Medication Instructions:  Your physician recommends that you continue on your current medications as directed. Please refer to the Current Medication list given to you today.  Follow-Up: Follow up in 1 year with Dr. Hochrein.  You will receive a letter in the mail 2 months before you are due.  Please call us when you receive this letter to schedule your follow up appointment.   Thank you for choosing [facility]!!       

## 2015-03-05 ENCOUNTER — Telehealth: Payer: Self-pay | Admitting: Neurology

## 2015-03-05 NOTE — Telephone Encounter (Signed)
Pls see PCP or if acutely ill and PCP cannot see, would recommend going to ER

## 2015-03-05 NOTE — Telephone Encounter (Signed)
Patient's wife Edmonia Lynch called and stated she believes her husband had a seizure Saturday morning.  He has seemed fine since then but she would like to talk with Dr. Rexene Alberts about this and possibly bring her husband in.  Pleae call.

## 2015-03-05 NOTE — Telephone Encounter (Signed)
I called back and spoke with wife. She reports that Saturday morning while sitting at kitchen table, patient stated that he didn't feel good. Wife looked over, said that he didn't "look good". His head went back, eyes didn't close, and he would not respond to questions. Once he came too, neighbor came over. They did not call 911 due to response time and at that time patient asked them not to call 911. Patient then became diaphoretic and vomited. The rest of the day he was tired. No shaking reported, no loss of bowels. No event since then. Wife reports a similar event among friends at an event, no vomiting during this episode. Wife is asking if he should be brought in, or what do you suggest?

## 2015-03-05 NOTE — Telephone Encounter (Signed)
I spoke to wife and gave her advice below. She will call now to make appt with PCP. SHe will call back if any further needs.

## 2015-05-17 IMAGING — CT CT HEAD W/O CM
1 series · 16 of 30 positions shown, 20 images · non-contrast
Comparison: 05/21/2011

CLINICAL DATA: Syncope.

CT HEAD WITHOUT CONTRAST
TECHNIQUE: Contiguous axial images were obtained from the base of
the skull through the vertex without contrast.

[Series 2: head 5.0 h30s · axial · 0.49mm/px · z∈[+1062,+1207]mm · 16 of 33 slices shown, 20 images]
[im 2/33  brain]
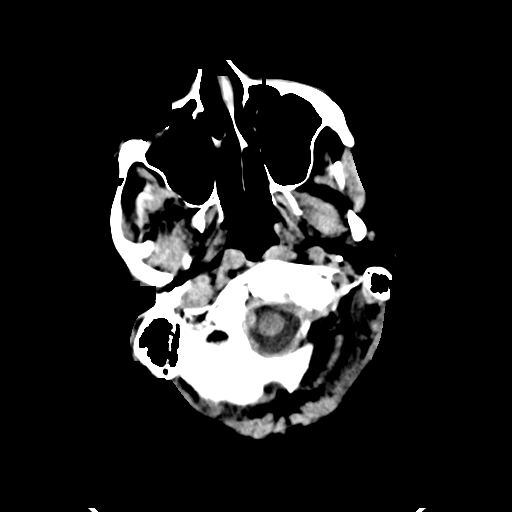
[im 2/33  bone]
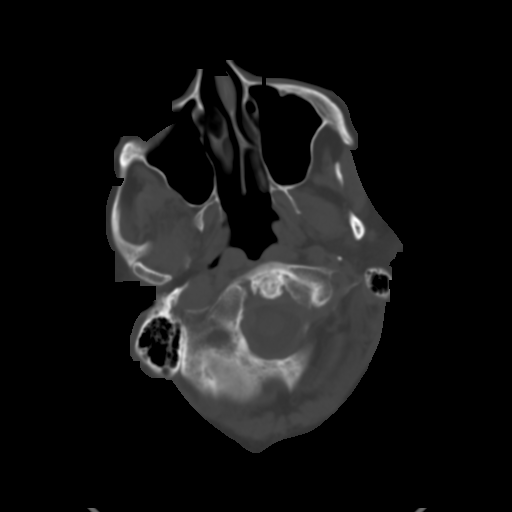
[im 4/33  brain]
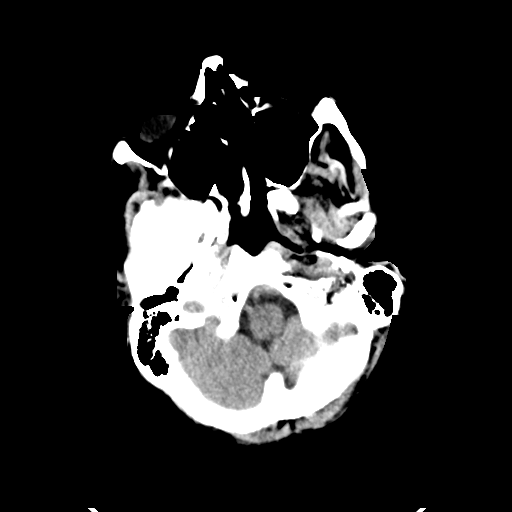
[im 6/33  brain]
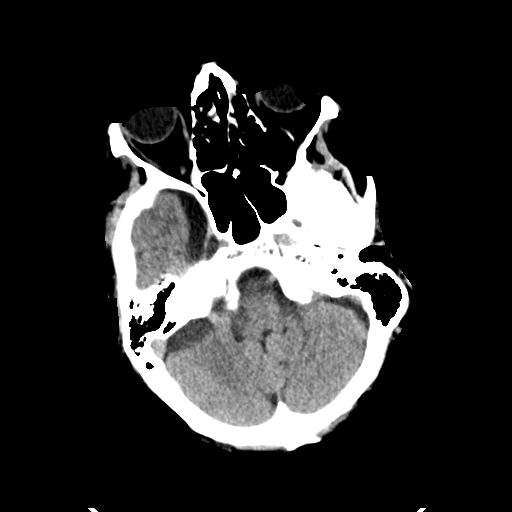
[im 8/33  brain]
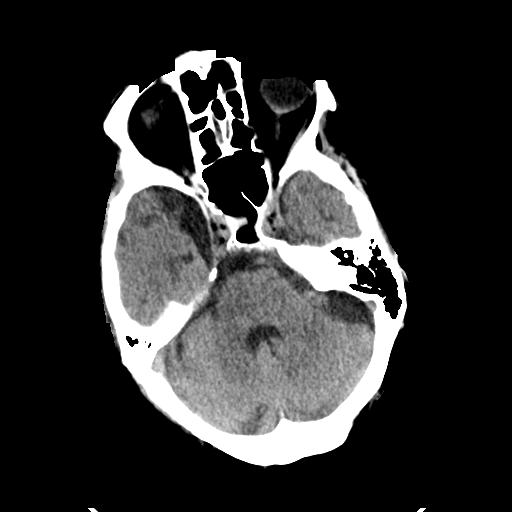
[im 9/33  brain]
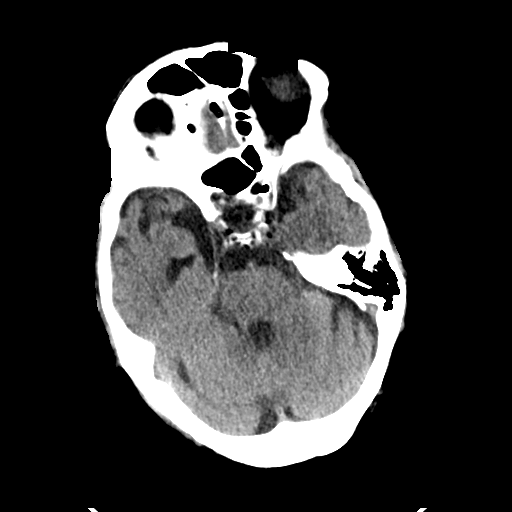
[im 9/33  bone]
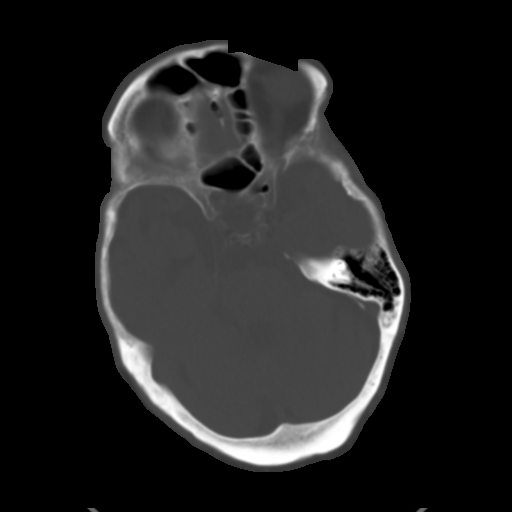
[im 12/33  brain]
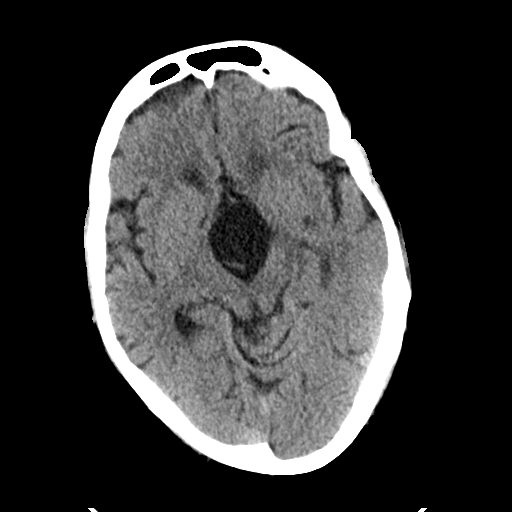
[im 14/33  brain]
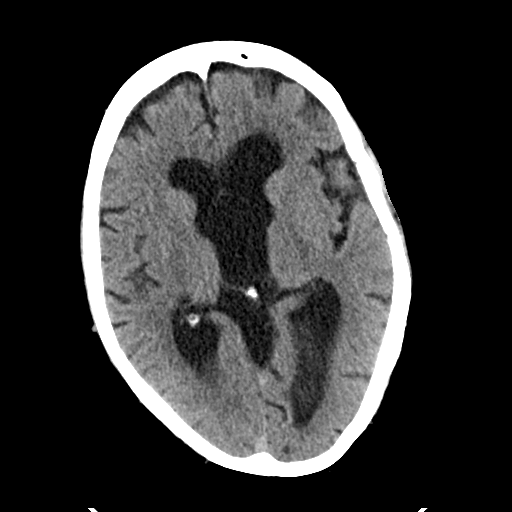
[im 16/33  brain]
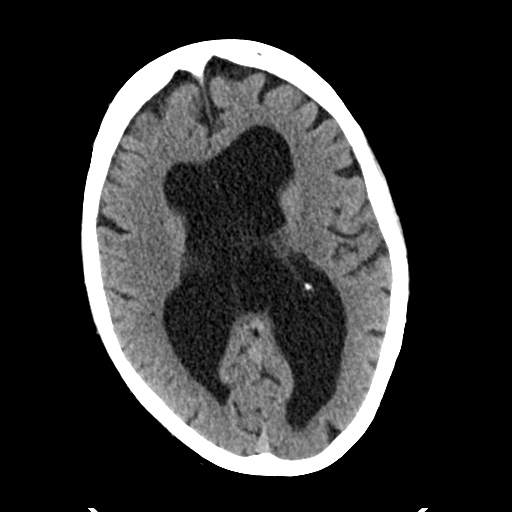
[im 17/33  brain]
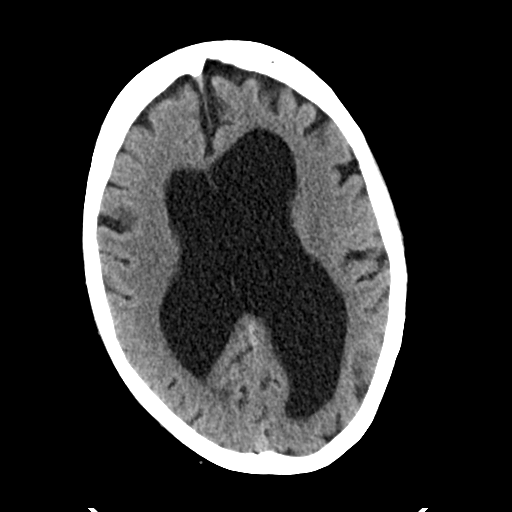
[im 17/33  bone]
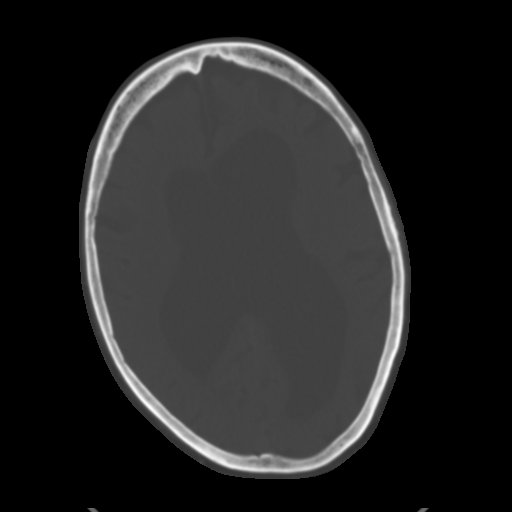
[im 19/33  brain]
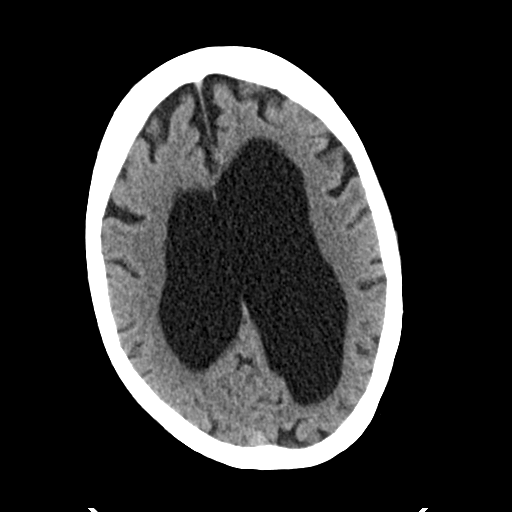
[im 21/33  brain]
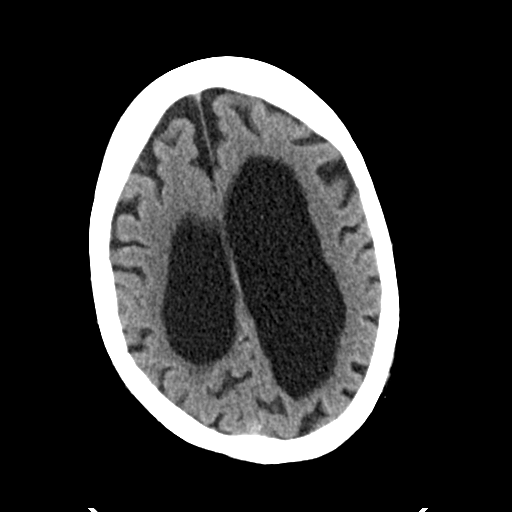
[im 24/33  brain]
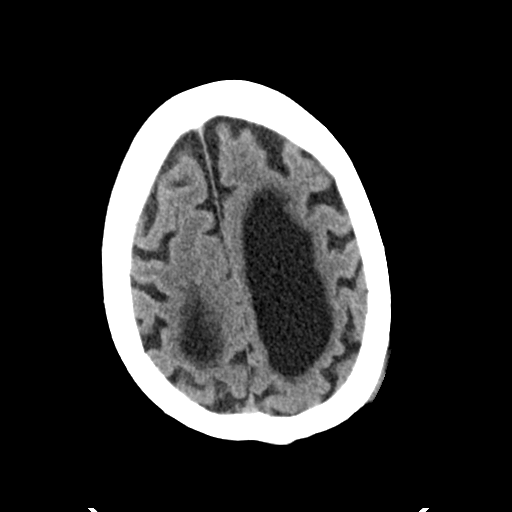
[im 25/33  brain]
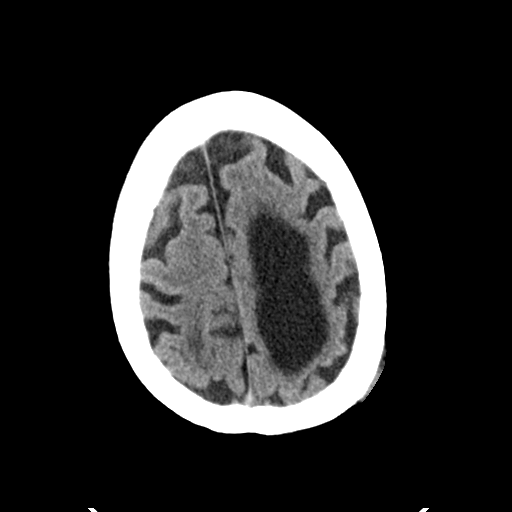
[im 25/33  bone]
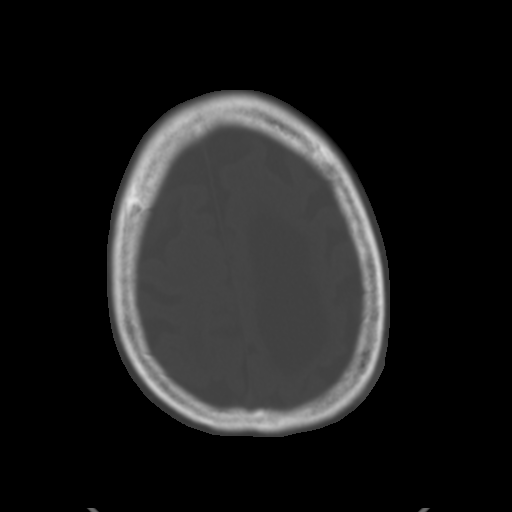
[im 27/33  brain]
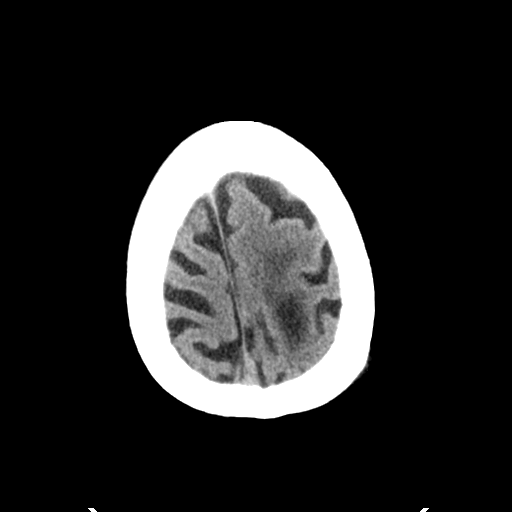
[im 29/33  brain]
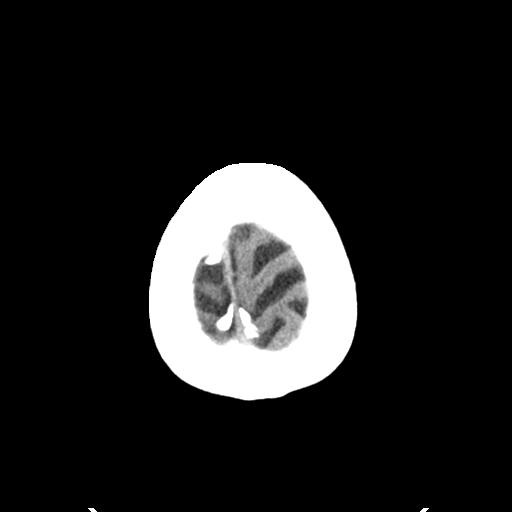
[im 31/33  brain]
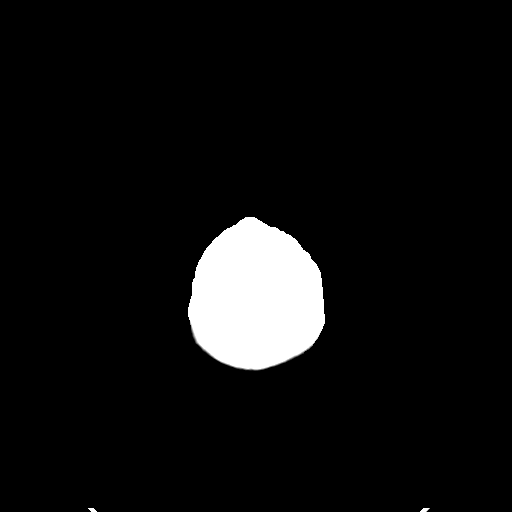

[16 of 30 positions shown; findings below may reference images not displayed]

FINDINGS: There is dilatation of the lateral ventricles and the
third ventricle.  The fourth ventricle has a relatively normal
size.  The ventricular dilatation is similar to previous exam and
is likely a chronic abnormality.There is prominence of the sulci
and ventricles consistent with brain atrophy.

There is no evidence for acute brain infarct, hemorrhage or mass.

The paranasal sinuses are clear.  The mastoid air cells are clear.
The skull is intact.
IMPRESSION: 1.  No acute intracranial abnormalities.
2.  Brain atrophy.
3.  Similar appearance of chronic hydrocephalus.

## 2015-06-26 ENCOUNTER — Emergency Department (HOSPITAL_COMMUNITY)
Admission: EM | Admit: 2015-06-26 | Discharge: 2015-06-26 | Disposition: A | Discharge status: Home / Self Care | Payer: Medicare Other | Attending: Emergency Medicine | Admitting: Emergency Medicine

## 2015-06-26 ENCOUNTER — Encounter (HOSPITAL_COMMUNITY): Payer: Self-pay | Admitting: Emergency Medicine

## 2015-06-26 DIAGNOSIS — R110 Nausea: Secondary | ICD-10-CM | POA: Diagnosis not present

## 2015-06-26 DIAGNOSIS — R42 Dizziness and giddiness: Secondary | ICD-10-CM | POA: Diagnosis not present

## 2015-06-26 DIAGNOSIS — Z8739 Personal history of other diseases of the musculoskeletal system and connective tissue: Secondary | ICD-10-CM | POA: Insufficient documentation

## 2015-06-26 DIAGNOSIS — J45909 Unspecified asthma, uncomplicated: Secondary | ICD-10-CM | POA: Diagnosis not present

## 2015-06-26 DIAGNOSIS — E785 Hyperlipidemia, unspecified: Secondary | ICD-10-CM | POA: Insufficient documentation

## 2015-06-26 DIAGNOSIS — Z85828 Personal history of other malignant neoplasm of skin: Secondary | ICD-10-CM | POA: Diagnosis not present

## 2015-06-26 DIAGNOSIS — Z8669 Personal history of other diseases of the nervous system and sense organs: Secondary | ICD-10-CM | POA: Diagnosis not present

## 2015-06-26 DIAGNOSIS — I10 Essential (primary) hypertension: Secondary | ICD-10-CM | POA: Diagnosis not present

## 2015-06-26 DIAGNOSIS — Z79899 Other long term (current) drug therapy: Secondary | ICD-10-CM | POA: Diagnosis not present

## 2015-06-26 DIAGNOSIS — K219 Gastro-esophageal reflux disease without esophagitis: Secondary | ICD-10-CM | POA: Insufficient documentation

## 2015-06-26 DIAGNOSIS — Z87891 Personal history of nicotine dependence: Secondary | ICD-10-CM | POA: Diagnosis not present

## 2015-06-26 DIAGNOSIS — Z86010 Personal history of colonic polyps: Secondary | ICD-10-CM | POA: Insufficient documentation

## 2015-06-26 LAB — CBC WITH DIFFERENTIAL/PLATELET
Basophils Absolute: 0 10*3/uL (ref 0.0–0.1)
Basophils Relative: 1 %
Eosinophils Absolute: 0 10*3/uL (ref 0.0–0.7)
Eosinophils Relative: 0 %
HCT: 41.9 % (ref 39.0–52.0)
Hemoglobin: 14.2 g/dL (ref 13.0–17.0)
Lymphocytes Relative: 15 %
Lymphs Abs: 0.6 10*3/uL — ABNORMAL LOW (ref 0.7–4.0)
MCH: 31.4 pg (ref 26.0–34.0)
MCHC: 33.9 g/dL (ref 30.0–36.0)
MCV: 92.7 fL (ref 78.0–100.0)
Monocytes Absolute: 0.1 10*3/uL (ref 0.1–1.0)
Monocytes Relative: 2 %
Neutro Abs: 3.5 10*3/uL (ref 1.7–7.7)
Neutrophils Relative %: 82 %
Platelets: 195 10*3/uL (ref 150–400)
RBC: 4.52 MIL/uL (ref 4.22–5.81)
RDW: 12.6 % (ref 11.5–15.5)
WBC: 4.2 10*3/uL (ref 4.0–10.5)

## 2015-06-26 LAB — COMPREHENSIVE METABOLIC PANEL
ALT: 14 U/L — ABNORMAL LOW (ref 17–63)
AST: 16 U/L (ref 15–41)
Albumin: 3.8 g/dL (ref 3.5–5.0)
Alkaline Phosphatase: 70 U/L (ref 38–126)
Anion gap: 6 (ref 5–15)
BUN: 12 mg/dL (ref 6–20)
CO2: 27 mmol/L (ref 22–32)
Calcium: 9 mg/dL (ref 8.9–10.3)
Chloride: 100 mmol/L — ABNORMAL LOW (ref 101–111)
Creatinine, Ser: 0.56 mg/dL — ABNORMAL LOW (ref 0.61–1.24)
GFR calc Af Amer: >60 mL/min (ref >60)
GFR calc non Af Amer: >60 mL/min (ref >60)
Glucose, Bld: 109 mg/dL — ABNORMAL HIGH (ref 65–99)
Potassium: 3.9 mmol/L (ref 3.5–5.1)
Sodium: 133 mmol/L — ABNORMAL LOW (ref 135–145)
Total Bilirubin: 0.6 mg/dL (ref 0.3–1.2)
Total Protein: 6.6 g/dL (ref 6.5–8.1)

## 2015-06-26 LAB — I-STAT TROPONIN, ED: Troponin i, poc: 0.01 ng/mL (ref 0.00–0.08)

## 2015-06-26 MED ORDER — ONDANSETRON 4 MG PO TBDP: ORAL_TABLET | ORAL | Status: DC

## 2015-06-26 MED ORDER — SODIUM CHLORIDE 0.9 % IV BOLUS (SEPSIS)
500.0000 mL | Freq: Once | INTRAVENOUS | Status: AC
  Administered 2015-06-26: 500 mL via INTRAVENOUS

## 2015-06-26 MED ORDER — MECLIZINE HCL 25 MG PO TABS: 25.0000 mg | ORAL_TABLET | Freq: Three times a day (TID) | ORAL | Status: DC | PRN

## 2015-06-26 NOTE — Discharge Instructions (Signed)
Follow up with your md this week. °

## 2015-06-26 NOTE — ED Notes (Signed)
Patient arrives via EMS from home with c/o sudden onset of dizziness with nausea and vomiting upon standing or bending that started at 0930 today. Patient arrives alert/oriented x 4. No complaints of dizziness, nausea, or vomiting at this time. Patient reports h/o vertigo.

## 2015-06-26 NOTE — ED Notes (Signed)
[facility] 

## 2015-06-26 NOTE — ED Notes (Signed)
Pt awaiting transport by EMS, given meal while waiting.

## 2015-06-26 NOTE — ED Notes (Signed)
Pt assisted up out of bed and around in room. Pt back in bed.

## 2015-06-26 NOTE — ED Notes (Signed)
Awaiting EMS for transport home.

## 2015-06-26 NOTE — ED Provider Notes (Signed)
CSN: [id number]     Arrival date & time 06/26/15  1418 History   First MD Initiated Contact with Patient 06/26/15 1421     Chief Complaint  Patient presents with  . Dizziness     (Consider location/radiation/quality/duration/timing/severity/associated sxs/prior Treatment) Patient is a 74 y.o. male presenting with dizziness. The history is provided by the patient (Patient states that he became very dizzy today and nauseated. He was given nausea medicine by the paramedics and the nausea improved).  Dizziness Quality:  Head spinning and imbalance Severity:  Moderate Onset quality:  Sudden Timing:  Intermittent Progression:  Improving Chronicity:  New Context: not when bending over   Relieved by:  Nothing Associated symptoms: no chest pain, no diarrhea and no headaches     Past Medical History  Diagnosis Date  . Benign paroxysmal positional vertigo   . Obstructive hydrocephalus   . Cautious gait     takes Carbidopa-Levodopa  . Basal cell carcinoma     "primarily on my face; I've had a bunch" (02/25/2013)  . 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 Ulcerative colitis ([place])     takes Apriso daily  . Unspecified essential hypertension     takes Diovan daily  . Hyperlipidemia     is on med but unsure of name-to bring day of surgery  . Asthma     was on allergy shots and no problems 4-47yrs ago  . Arthritis     left knee  . Joint swelling     left knee  . Reflux     takes Protonix daily  . History of colon polyps   . History of blood transfusion     no abnormal reaction noted  . Cataracts, bilateral     immature  . Complication of anesthesia     "I have aqueductal stenosis" (02/25/2013); concerns re: general anesthesia b/c he didn't devleop gait disturbance until the post-operative period following prostectomy in 2009   Past Surgical History  Procedure Laterality Date  . Suprapubic prostatectomy  02/21/2008    BPH  . Basal cell  carcinoma excision Left 2000's    points to nasal fold  (02/25/2013)  . Colonoscopy    . Melanoma excision with sentinel lymph node biopsy Left 10/25/2013    Procedure: WIDE EXCISION MELANOMA LEFT ARM WITH LEFT AXILLARY  SENTINEL LYMPH NODE BIOPSY;  Surgeon: Joyice Faster. Cornett, MD;  Location: [facility];  Service: General;  Laterality: Left;   Family History  Problem Relation Age of Onset  . Heart failure Father   . CAD Father 49    Died age 31  . Dementia Mother   . Heart attack Paternal Uncle    Social History  Substance Use Topics  . Smoking status: Former Smoker -- 8 years    Types: Pipe  . Smokeless tobacco: Never Used     Comment: 02/25/2013 "quit smoking > 20 yr ago"  . Alcohol Use: 0.0 oz/week    0 Standard drinks or equivalent per week     Comment: glass of wine occasionally    Review of Systems  Constitutional: Negative for appetite change and fatigue.  HENT: Negative for congestion, ear discharge and sinus pressure.   Eyes: Negative for discharge.  Respiratory: Negative for cough.   Cardiovascular: Negative for chest pain.  Gastrointestinal: Negative for abdominal pain and diarrhea.  Genitourinary: Negative for frequency and hematuria.  Musculoskeletal: Negative for back pain.  Skin: Negative  for rash.  Neurological: Positive for dizziness. Negative for seizures and headaches.  Psychiatric/Behavioral: Negative for hallucinations.      Allergies  Lexapro and Tetracyclines & related  Home Medications   Prior to Admission medications   Medication Sig Start Date End Date Taking? Authorizing Provider  carbidopa-levodopa (SINEMET IR) 25-100 MG per tablet Take 1 tablet by mouth 3 (three) times daily. 12/27/14  Yes Star Age, MD  mesalamine (LIALDA) 1.2 G EC tablet Take 2.4 g by mouth daily with breakfast.    Yes Historical Provider, MD  pantoprazole (PROTONIX) 40 MG tablet Take 40 mg by mouth daily.    Yes Historical Provider, MD  pravastatin (PRAVACHOL) 20 MG tablet  Take 20 mg by mouth daily.   Yes Historical Provider, MD  sertraline (ZOLOFT) 25 MG tablet Take 25 mg by mouth daily.  04/11/14 06/26/15 Yes Historical Provider, MD  valsartan (DIOVAN) 40 MG tablet Take 40 mg by mouth daily.   Yes Historical Provider, MD  vitamin B-12 (CYANOCOBALAMIN) 1000 MCG tablet Take 1,000 mcg by mouth every morning.   Yes Historical Provider, MD   BP 154/83 mmHg  Pulse 74  Temp(Src) 97.6 F (36.4 C) (Oral)  Resp 17  Ht 5\' 8"  (1.727 m)  Wt 155 lb (70.308 kg)  BMI 23.57 kg/m2  SpO2 97% Physical Exam  Constitutional: He is oriented to person, place, and time. He appears well-developed.  HENT:  Head: Normocephalic.  Eyes: Conjunctivae and EOM are normal. No scleral icterus.  Neck: Neck supple. No thyromegaly present.  Cardiovascular: Normal rate and regular rhythm.  Exam reveals no gallop and no friction rub.   No murmur heard. Pulmonary/Chest: No stridor. He has no wheezes. He has no rales. He exhibits no tenderness.  Abdominal: He exhibits no distension. There is no tenderness. There is no rebound.  Musculoskeletal: Normal range of motion. He exhibits no edema.  Lymphadenopathy:    He has no cervical adenopathy.  Neurological: He is oriented to person, place, and time. He exhibits normal muscle tone. Coordination normal.  Skin: No rash noted. No erythema.  Psychiatric: He has a normal mood and affect. His behavior is normal.    ED Course  Procedures (including critical care time) Labs Review Labs Reviewed  CBC WITH DIFFERENTIAL/PLATELET - Abnormal; Notable for the following:    Lymphs Abs 0.6 (*)    All other components within normal limits  COMPREHENSIVE METABOLIC PANEL - Abnormal; Notable for the following:    Sodium 133 (*)    Chloride 100 (*)    Glucose, Bld 109 (*)    Creatinine, Ser 0.56 (*)    ALT 14 (*)    All other components within normal limits  I-STAT TROPOININ, ED    Imaging Review No results found. I have personally reviewed and  evaluated these images and lab results as part of my medical decision-making.   EKG Interpretation None      MDM   Final diagnoses:  None    Labs and EKG unremarkable. Initial exam patient was no longer dizzy. At discharge he was not dizzy she was able to ambulate with his walker. he stated that his walking with Dr. Craig Staggers patient was discharged with Antivert and Zofran and will follow-up with his PCP    Milton Ferguson, MD 06/26/15 [phone]

## 2015-07-04 ENCOUNTER — Ambulatory Visit: Payer: Self-pay | Admitting: Neurology

## 2015-07-10 ENCOUNTER — Ambulatory Visit: Payer: Medicare Other | Admitting: Neurology

## 2015-10-18 ENCOUNTER — Emergency Department (HOSPITAL_COMMUNITY): Payer: Medicare Other

## 2015-10-18 ENCOUNTER — Emergency Department (HOSPITAL_COMMUNITY)
Admission: EM | Admit: 2015-10-18 | Discharge: 2015-10-18 | Disposition: A | Discharge status: Home / Self Care | Payer: Medicare Other | Attending: Emergency Medicine | Admitting: Emergency Medicine

## 2015-10-18 ENCOUNTER — Encounter (HOSPITAL_COMMUNITY): Payer: Self-pay | Admitting: *Deleted

## 2015-10-18 DIAGNOSIS — R55 Syncope and collapse: Secondary | ICD-10-CM | POA: Insufficient documentation

## 2015-10-18 DIAGNOSIS — Y9289 Other specified places as the place of occurrence of the external cause: Secondary | ICD-10-CM | POA: Insufficient documentation

## 2015-10-18 DIAGNOSIS — S01111A Laceration without foreign body of right eyelid and periocular area, initial encounter: Secondary | ICD-10-CM | POA: Insufficient documentation

## 2015-10-18 DIAGNOSIS — E785 Hyperlipidemia, unspecified: Secondary | ICD-10-CM | POA: Insufficient documentation

## 2015-10-18 DIAGNOSIS — M1990 Unspecified osteoarthritis, unspecified site: Secondary | ICD-10-CM | POA: Diagnosis not present

## 2015-10-18 DIAGNOSIS — Y93E1 Activity, personal bathing and showering: Secondary | ICD-10-CM | POA: Diagnosis not present

## 2015-10-18 DIAGNOSIS — Z87891 Personal history of nicotine dependence: Secondary | ICD-10-CM | POA: Insufficient documentation

## 2015-10-18 DIAGNOSIS — Z79899 Other long term (current) drug therapy: Secondary | ICD-10-CM | POA: Insufficient documentation

## 2015-10-18 DIAGNOSIS — W182XXA Fall in (into) shower or empty bathtub, initial encounter: Secondary | ICD-10-CM | POA: Insufficient documentation

## 2015-10-18 DIAGNOSIS — J45909 Unspecified asthma, uncomplicated: Secondary | ICD-10-CM | POA: Insufficient documentation

## 2015-10-18 DIAGNOSIS — Y999 Unspecified external cause status: Secondary | ICD-10-CM | POA: Insufficient documentation

## 2015-10-18 DIAGNOSIS — I10 Essential (primary) hypertension: Secondary | ICD-10-CM | POA: Insufficient documentation

## 2015-10-18 LAB — I-STAT TROPONIN, ED: Troponin i, poc: 0.01 ng/mL (ref 0.00–0.08)

## 2015-10-18 LAB — CBC WITH DIFFERENTIAL/PLATELET
Basophils Absolute: 0 10*3/uL (ref 0.0–0.1)
Basophils Relative: 0 %
Eosinophils Absolute: 0.1 10*3/uL (ref 0.0–0.7)
Eosinophils Relative: 1 %
HCT: 43.6 % (ref 39.0–52.0)
Hemoglobin: 14.4 g/dL (ref 13.0–17.0)
Lymphocytes Relative: 10 %
Lymphs Abs: 0.9 10*3/uL (ref 0.7–4.0)
MCH: 31 pg (ref 26.0–34.0)
MCHC: 33 g/dL (ref 30.0–36.0)
MCV: 93.8 fL (ref 78.0–100.0)
Monocytes Absolute: 0.7 10*3/uL (ref 0.1–1.0)
Monocytes Relative: 7 %
Neutro Abs: 7.3 10*3/uL (ref 1.7–7.7)
Neutrophils Relative %: 82 %
Platelets: 179 10*3/uL (ref 150–400)
RBC: 4.65 MIL/uL (ref 4.22–5.81)
RDW: 13.4 % (ref 11.5–15.5)
WBC: 9 10*3/uL (ref 4.0–10.5)

## 2015-10-18 LAB — COMPREHENSIVE METABOLIC PANEL
ALT: 9 U/L — ABNORMAL LOW (ref 17–63)
AST: 16 U/L (ref 15–41)
Albumin: 3.7 g/dL (ref 3.5–5.0)
Alkaline Phosphatase: 80 U/L (ref 38–126)
Anion gap: 5 (ref 5–15)
BUN: 15 mg/dL (ref 6–20)
CO2: 27 mmol/L (ref 22–32)
Calcium: 8.8 mg/dL — ABNORMAL LOW (ref 8.9–10.3)
Chloride: 106 mmol/L (ref 101–111)
Creatinine, Ser: 0.88 mg/dL (ref 0.61–1.24)
GFR calc Af Amer: >60 mL/min (ref >60)
GFR calc non Af Amer: >60 mL/min (ref >60)
Glucose, Bld: 82 mg/dL (ref 65–99)
Potassium: 3.9 mmol/L (ref 3.5–5.1)
Sodium: 138 mmol/L (ref 135–145)
Total Bilirubin: 0.6 mg/dL (ref 0.3–1.2)
Total Protein: 6.7 g/dL (ref 6.5–8.1)

## 2015-10-18 MED ORDER — TETANUS-DIPHTH-ACELL PERTUSSIS 5-2.5-18.5 LF-MCG/0.5 IM SUSP
0.5000 mL | Freq: Once | INTRAMUSCULAR | Status: AC
  Administered 2015-10-18: 0.5 mL via INTRAMUSCULAR
  Filled 2015-10-18: qty 0.5

## 2015-10-18 MED ORDER — SODIUM CHLORIDE 0.9 % IV BOLUS (SEPSIS)
500.0000 mL | Freq: Once | INTRAVENOUS | Status: AC
  Administered 2015-10-18: 500 mL via INTRAVENOUS

## 2015-10-18 NOTE — ED Notes (Addendum)
[facility] at bedside for transport home. Patient with no complaints at this time. Respirations even and unlabored. Skin warm/dry. Discharge instructions reviewed with patient at this time. Patient given opportunity to voice concerns/ask questions.Patient discharged at this time and left Emergency Department via EMS

## 2015-10-18 NOTE — ED Notes (Addendum)
Orthostatic vital signs taken post 1 liter NS bolus by EMS PTA to ED

## 2015-10-18 NOTE — Discharge Instructions (Signed)
Stop your diovan and follow up with your family md next week.  Return if problems

## 2015-10-18 NOTE — ED Notes (Signed)
Pt was taking a shower this morning and felt dizzy and passed out and fell in the shower causing abrasion to left eyelid. Pt has hx of syncopal episodes x 3 in which he called EMS but never went to the hospital for evaluation. EMS reports CBG 98,  BP 85/52 initially. 18g IV lt A/C. 1L NS given by EMS. BP 103/60 for EMS on arrival to ED. Pt A&O x 4 on arrival to ED, pt denies dizziness now.

## 2015-10-18 NOTE — ED Notes (Signed)
Per Bailey Mech, RN, IV was pulled out. No new access at this time.

## 2015-10-18 NOTE — ED Provider Notes (Signed)
CSN: [id number]     Arrival date & time 10/18/15  1058 History   First MD Initiated Contact with Patient 10/18/15 1224     Chief Complaint  Patient presents with  . Loss of Consciousness     (Consider location/radiation/quality/duration/timing/severity/associated sxs/prior Treatment) Patient is a 75 y.o. male presenting with syncope. The history is provided by the patient (Patient states that he was in the bathroom and had a syncopal episode. He states he's back to his normal now paramedics arrived his blood pressure was low he received some fluids and his blood pressure normalized).  Loss of Consciousness Episode history:  Single Most recent episode:  Today Timing:  Rare Progression:  Resolved Chronicity:  Recurrent Context: not blood draw   Associated symptoms: dizziness   Associated symptoms: no chest pain, no headaches and no seizures     Past Medical History  Diagnosis Date  . Benign paroxysmal positional vertigo   . Obstructive hydrocephalus   . Cautious gait     takes Carbidopa-Levodopa  . Basal cell carcinoma     "primarily on my face; I've had a bunch" (02/25/2013)  . 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 Ulcerative colitis ([place])     takes Apriso daily  . Unspecified essential hypertension     takes Diovan daily  . Hyperlipidemia     is on med but unsure of name-to bring day of surgery  . Asthma     was on allergy shots and no problems 4-11yrs ago  . Arthritis     left knee  . Joint swelling     left knee  . Reflux     takes Protonix daily  . History of colon polyps   . History of blood transfusion     no abnormal reaction noted  . Cataracts, bilateral     immature  . 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 Suprapubic prostatectomy  02/21/2008    BPH  . Basal cell carcinoma excision Left 2000's    points to nasal fold  (02/25/2013)  . Colonoscopy    . Melanoma excision with sentinel lymph node biopsy Left 10/25/2013    Procedure: WIDE EXCISION MELANOMA LEFT ARM WITH LEFT AXILLARY  SENTINEL LYMPH NODE BIOPSY;  Surgeon: Joyice Faster. Cornett, MD;  Location: [facility];  Service: General;  Laterality: Left;   Family History  Problem Relation Age of Onset  . Heart failure Father   . CAD Father 21    Died age 67  . Dementia Mother   . Heart attack Paternal Uncle    Social History  Substance Use Topics  . Smoking status: Former Smoker -- 8 years    Types: Pipe  . Smokeless tobacco: Never Used     Comment: 02/25/2013 "quit smoking > 20 yr ago"  . Alcohol Use: 0.0 oz/week    0 Standard drinks or equivalent per week     Comment: glass of wine occasionally    Review of Systems  Constitutional: Negative for appetite change and fatigue.  HENT: Negative for congestion, ear discharge and sinus pressure.   Eyes: Negative for discharge.  Respiratory: Negative for cough.   Cardiovascular: Positive for syncope. Negative for chest pain.  Gastrointestinal: Negative for abdominal pain  and diarrhea.  Genitourinary: Negative for frequency and hematuria.  Musculoskeletal: Negative for back pain.  Skin: Negative for rash.  Neurological: Positive for dizziness. Negative for seizures and headaches.  Psychiatric/Behavioral: Negative for hallucinations.      Allergies  Lexapro and Tetracyclines & related  Home Medications   Prior to Admission medications   Medication Sig Start Date End Date Taking? Authorizing Provider  carbidopa-levodopa (SINEMET IR) 25-100 MG per tablet Take 1 tablet by mouth 3 (three) times daily. 12/27/14  Yes Star Age, MD  mesalamine (LIALDA) 1.2 G EC tablet Take 2.4 g by mouth daily with breakfast.    Yes Historical Provider, MD  pantoprazole (PROTONIX) 40 MG tablet Take 40  mg by mouth daily.    Yes Historical Provider, MD  pravastatin (PRAVACHOL) 20 MG tablet Take 20 mg by mouth daily.   Yes Historical Provider, MD  sertraline (ZOLOFT) 25 MG tablet Take 25 mg by mouth daily.  04/11/14 10/18/15 Yes Historical Provider, MD  valsartan (DIOVAN) 40 MG tablet Take 40 mg by mouth daily.   Yes Historical Provider, MD  vitamin B-12 (CYANOCOBALAMIN) 1000 MCG tablet Take 1,000 mcg by mouth every morning.   Yes Historical Provider, MD   BP 117/99 mmHg  Pulse 68  Temp(Src) 98.6 F (37 C) (Oral)  Resp 21  Ht 5\' 6"  (1.676 m)  Wt 155 lb (70.308 kg)  BMI 25.03 kg/m2  SpO2 100% Physical Exam  Constitutional: He is oriented to person, place, and time. He appears well-developed.  HENT:  Head: Normocephalic.  Small abrasion to left forehead  Eyes: Conjunctivae and EOM are normal. No scleral icterus.  Neck: Neck supple. No thyromegaly present.  Cardiovascular: Normal rate and regular rhythm.  Exam reveals no gallop and no friction rub.   No murmur heard. Pulmonary/Chest: No stridor. He has no wheezes. He has no rales. He exhibits no tenderness.  Abdominal: He exhibits no distension. There is no tenderness. There is no rebound.  Musculoskeletal: Normal range of motion. He exhibits no edema.  Lymphadenopathy:    He has no cervical adenopathy.  Neurological: He is oriented to person, place, and time. He exhibits normal muscle tone. Coordination normal.  Skin: No rash noted. No erythema.  Psychiatric: He has a normal mood and affect. His behavior is normal.    ED Course  Procedures (including critical care time) Labs Review Labs Reviewed  COMPREHENSIVE METABOLIC PANEL - Abnormal; Notable for the following:    Calcium 8.8 (*)    ALT 9 (*)    All other components within normal limits  CBC WITH DIFFERENTIAL/PLATELET  I-STAT TROPOININ, ED    Imaging Review Ct Head Wo Contrast  10/18/2015  CLINICAL DATA:  Became dizzy and passed out while taking a shower this morning, fell  in shower causing abrasion to LEFT dilated, history of syncopal episodes, past history of benign paroxysmal positional vertigo, obstructive hydrocephalus, unspecified essential hypertension, asthma, former smoker EXAM: CT HEAD WITHOUT CONTRAST TECHNIQUE: Contiguous axial images were obtained from the base of the skull through the vertex without intravenous contrast. COMPARISON:  08/12/2013 FINDINGS: Mild generalized atrophy. Marked dilatation of lateral ventricles out of proportion to the degree of sulcal enlargement compatible with hydrocephalus. Third ventricle appears dilated but fourth ventricle appears decompressed. No midline shift or mass effect. Otherwise normal appearance of brain parenchyma. No intracranial hemorrhage, mass lesion or evidence acute infarction. No extra-axial fluid collections. Send mucosal thickening and minimal mucous within ethmoid air cells and sphenoid sinus. Fell intact. IMPRESSION: Generalized  atrophy. Marked dilatation of lateral and third ventricles with relatively decompressed fourth ventricle, raising question of aqueductal stenosis. Degree of ventricular dilatation is similar that seen on the previous exam. No new intracranial abnormalities. Electronically Signed   By: Lavonia Dana M.D.   On: 10/18/2015 13:48   I have personally reviewed and evaluated these images and lab results as part of my medical decision-making.   EKG Interpretation   Date/Time:  Thursday October 18 2015 11:04:26 EST Ventricular Rate:  65 PR Interval:  190 QRS Duration: 86 QT Interval:  406 QTC Calculation: 422 R Axis:   12 Text Interpretation:  Sinus rhythm Abnormal R-wave progression, early  transition Confirmed by Kelvyn Schunk  MD, Khian Remo [phone]) on 10/18/2015 4:06:50 PM     patient with Parkinson disease and hydrocephalus is being seen by neurology and has been seen for evaluation of syncopal episodes.  MDM   Final diagnoses:  Syncope and collapse    Labs were unremarkable CT scan shows  the hydrocephalus which is been there for quite some time. Patient is told to stop his Diovan and follow-up with his PCP    Milton Ferguson, MD 10/18/15 [phone]

## 2015-10-18 NOTE — ED Notes (Signed)
Called to room by pt. Blood on gowns and sheets. Pt states IV came out. Pts nurse notiifed. Pt assisted to stand beside bed to use urinal

## 2015-10-18 NOTE — ED Notes (Signed)
Small, 1 cm laceration to left upper, lateral periorbital area. Cleansed with sterile saline. Bleeding is controlled at this time. Updated wife over phone with patient's permission.

## 2015-11-02 IMAGING — CR DG CHEST 2V
2 series · 2 of 2 positions shown · non-contrast
Comparison: CT chest 08/12/2013

CLINICAL DATA: Fall, multiple rib fractures

EXAM:
CHEST  2 VIEW

[w chest pa]
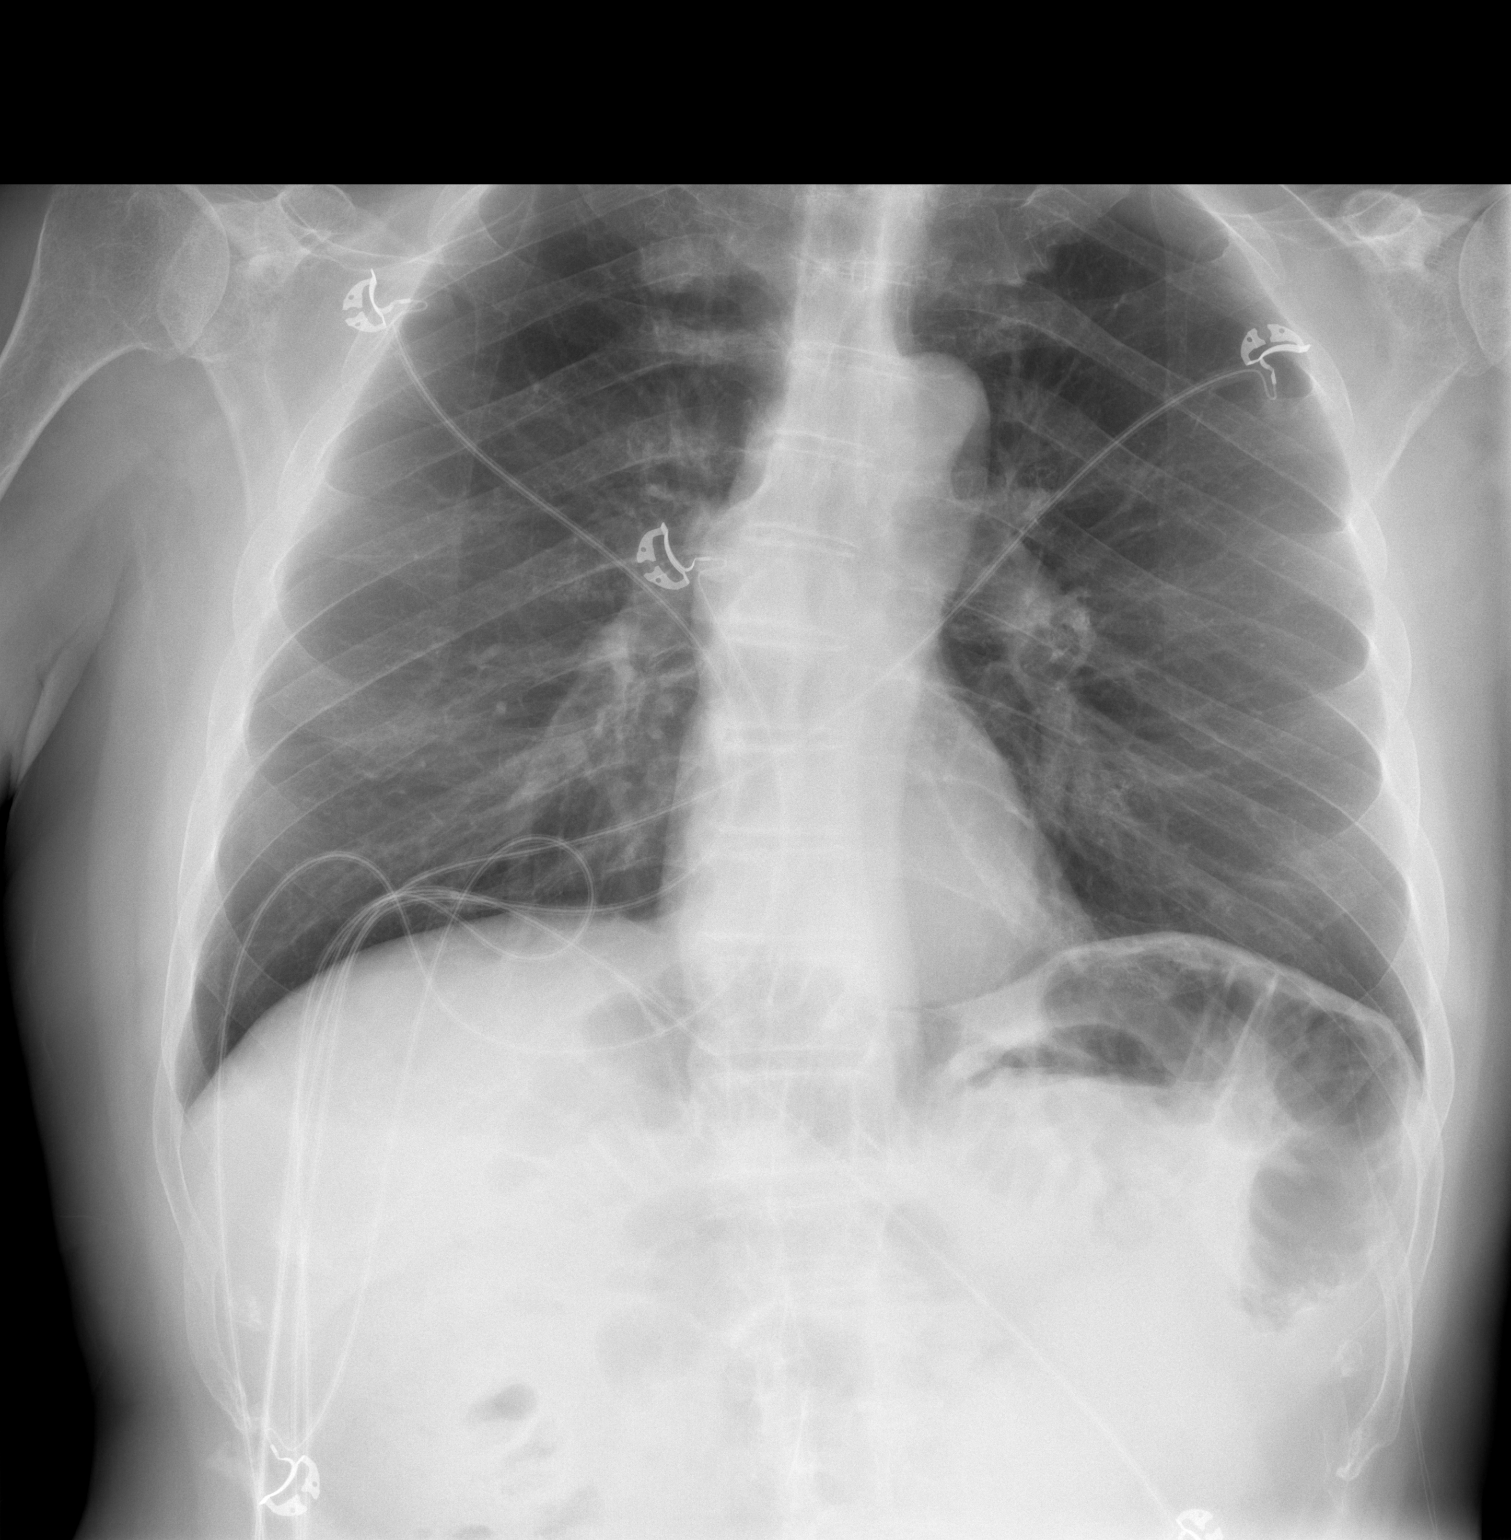

[w chest lat]
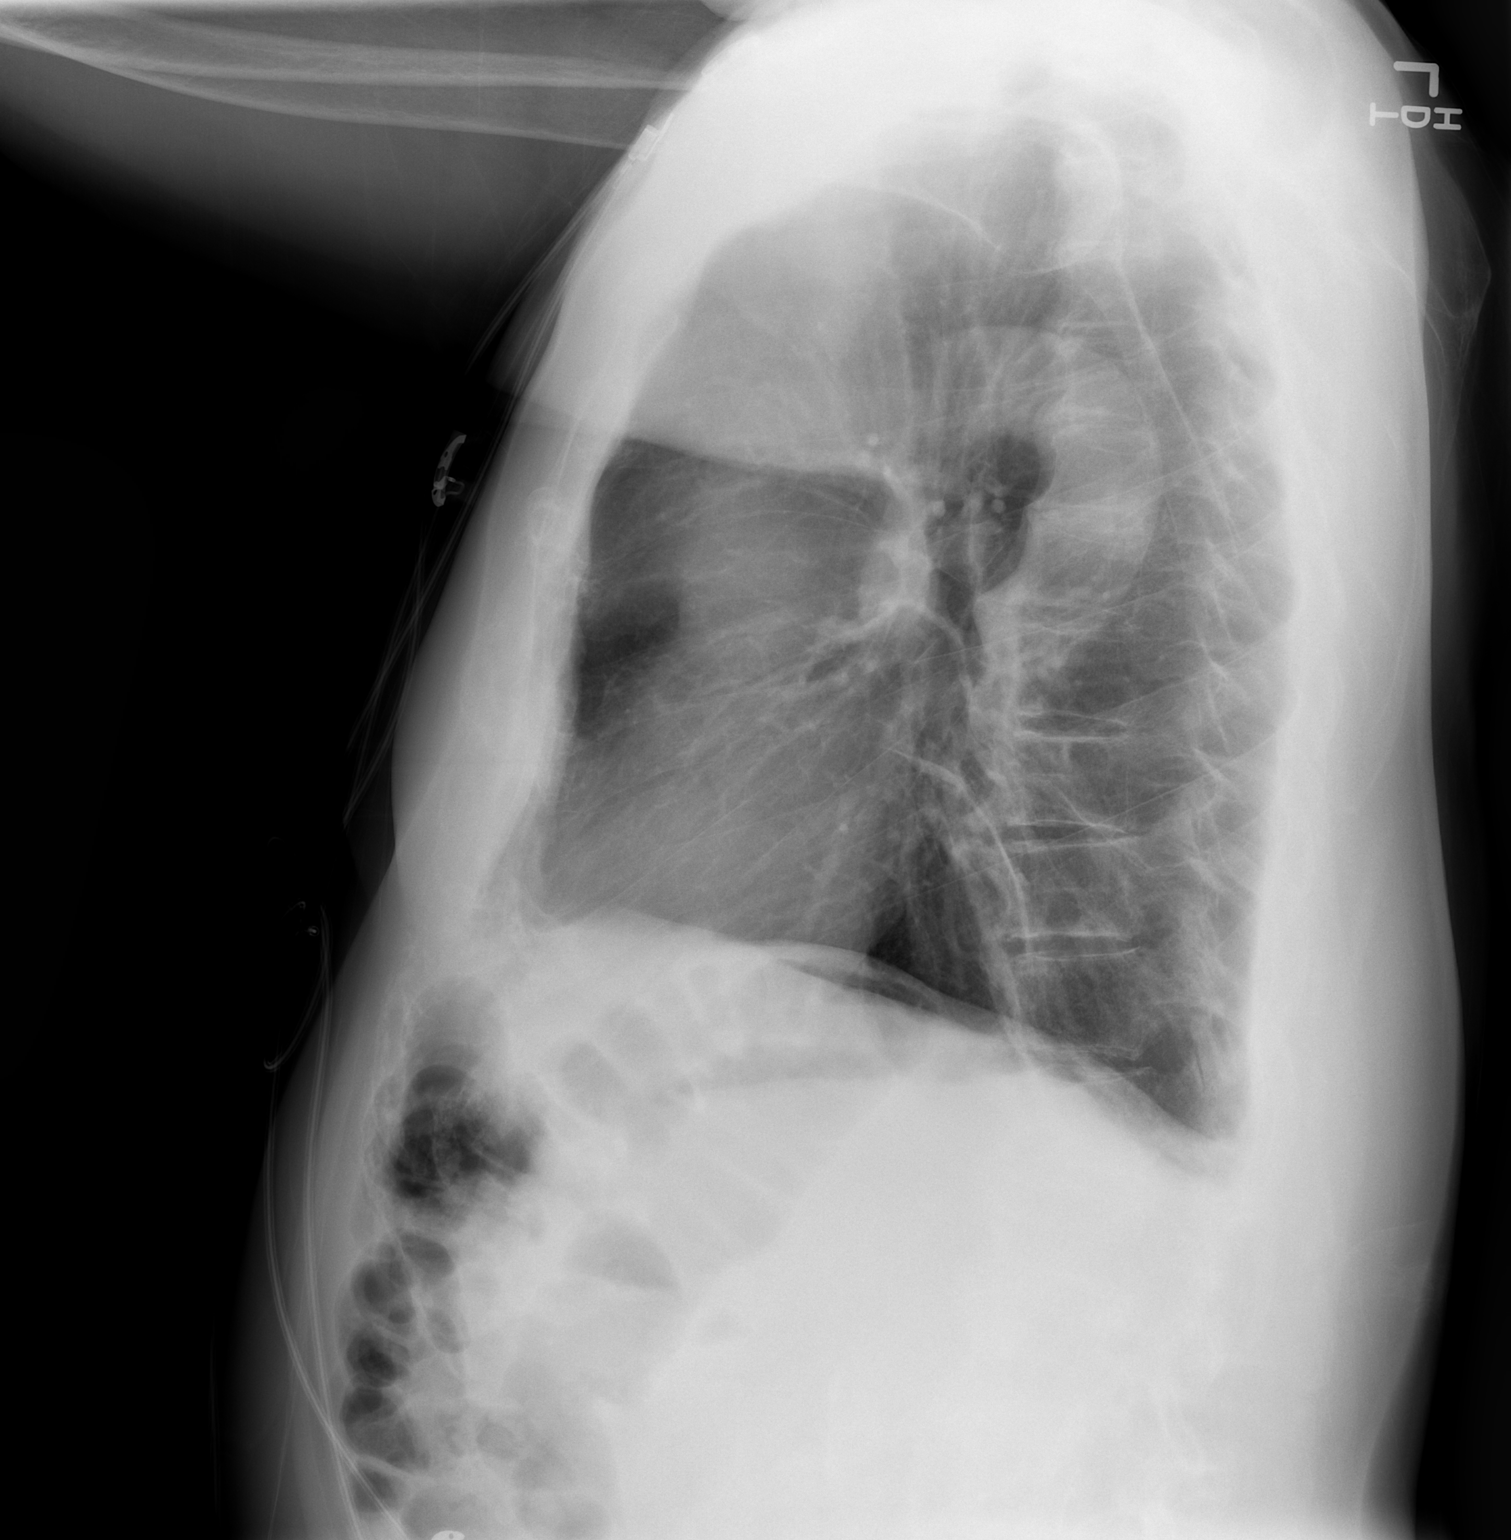

[2 of 2 positions shown; findings below may reference images not displayed]

FINDINGS: The lungs are clear. No pneumothorax or pleural effusion.
Nondisplaced rib fractures better seen on recent CT scan. Cardiac
and mediastinal contours are within normal limits. Nonspecific
visualized bowel gas pattern. Multilevel degenerative change
throughout the spine.
IMPRESSION: No active cardiopulmonary disease. Specifically, no pneumothorax or
pleural effusion.

Nondisplaced right-sided rib fractures better seen on yesterday's CT
scan.

## 2016-01-21 ENCOUNTER — Other Ambulatory Visit: Payer: Self-pay | Admitting: Neurology

## 2016-01-21 ENCOUNTER — Telehealth: Payer: Self-pay | Admitting: Neurology

## 2016-01-21 NOTE — Telephone Encounter (Signed)
Wife Edmonia Lynch called to request refill of carbidopa-levodopa (SINEMET IR) 25-100 MG per tablet, states patient is completely out of refills, "only way he can come in for appointment is by ambulance and that cost an arm and a leg", states "has to have this medication or he won't live", wife states she is bedridden.

## 2016-01-21 NOTE — Telephone Encounter (Signed)
[place] for refills? How do you advise?

## 2016-01-21 NOTE — Telephone Encounter (Signed)
Please advise patient or wife that we do need to see him in FU at least once a year, for me to maintain his Rx for C/L. Alternatively, PCP may be willing to take over, if he sees PCP on a regular basis. Okay to give 3 mo Rx with no refills and schedule FU please.

## 2016-01-21 NOTE — Telephone Encounter (Signed)
I spoke to wife. She is agreeable to suggestion below. Wife reports that she will call PCP now to see if he is willing to take over Rx, that way he only has to see one doctor. Rx sent in. Wife is aware that this is only for 3 months, and must be seen before further refills.

## 2016-03-17 ENCOUNTER — Other Ambulatory Visit: Payer: Self-pay | Admitting: Neurology

## 2016-03-23 ENCOUNTER — Emergency Department (HOSPITAL_COMMUNITY): Payer: Medicare Other

## 2016-03-23 ENCOUNTER — Emergency Department (HOSPITAL_COMMUNITY)
Admission: EM | Admit: 2016-03-23 | Discharge: 2016-03-23 | Disposition: A | Discharge status: Home / Self Care | Payer: Medicare Other | Attending: Emergency Medicine | Admitting: Emergency Medicine

## 2016-03-23 ENCOUNTER — Encounter (HOSPITAL_COMMUNITY): Payer: Self-pay | Admitting: Emergency Medicine

## 2016-03-23 DIAGNOSIS — Y999 Unspecified external cause status: Secondary | ICD-10-CM | POA: Insufficient documentation

## 2016-03-23 DIAGNOSIS — S61451A Open bite of right hand, initial encounter: Secondary | ICD-10-CM | POA: Diagnosis present

## 2016-03-23 DIAGNOSIS — Z79899 Other long term (current) drug therapy: Secondary | ICD-10-CM | POA: Diagnosis not present

## 2016-03-23 DIAGNOSIS — J45909 Unspecified asthma, uncomplicated: Secondary | ICD-10-CM | POA: Diagnosis not present

## 2016-03-23 DIAGNOSIS — S61411A Laceration without foreign body of right hand, initial encounter: Secondary | ICD-10-CM | POA: Diagnosis not present

## 2016-03-23 DIAGNOSIS — G20 Parkinson's disease: Secondary | ICD-10-CM | POA: Diagnosis not present

## 2016-03-23 DIAGNOSIS — Z23 Encounter for immunization: Secondary | ICD-10-CM | POA: Diagnosis not present

## 2016-03-23 DIAGNOSIS — I10 Essential (primary) hypertension: Secondary | ICD-10-CM | POA: Diagnosis not present

## 2016-03-23 DIAGNOSIS — Z87891 Personal history of nicotine dependence: Secondary | ICD-10-CM | POA: Insufficient documentation

## 2016-03-23 DIAGNOSIS — Y939 Activity, unspecified: Secondary | ICD-10-CM | POA: Insufficient documentation

## 2016-03-23 DIAGNOSIS — W540XXA Bitten by dog, initial encounter: Secondary | ICD-10-CM | POA: Insufficient documentation

## 2016-03-23 DIAGNOSIS — Y929 Unspecified place or not applicable: Secondary | ICD-10-CM | POA: Diagnosis not present

## 2016-03-23 LAB — CBG MONITORING, ED: Glucose-Capillary: 87 mg/dL (ref 65–99)

## 2016-03-23 MED ORDER — AMOXICILLIN-POT CLAVULANATE 875-125 MG PO TABS
1.0000 | ORAL_TABLET | Freq: Two times a day (BID) | ORAL | 0 refills | Status: DC
Start: 2016-03-23 — End: 2016-04-02

## 2016-03-23 MED ORDER — TETANUS-DIPHTH-ACELL PERTUSSIS 5-2.5-18.5 LF-MCG/0.5 IM SUSP
0.5000 mL | Freq: Once | INTRAMUSCULAR | Status: AC
  Administered 2016-03-23: 0.5 mL via INTRAMUSCULAR
  Filled 2016-03-23: qty 0.5

## 2016-03-23 NOTE — ED Provider Notes (Signed)
[facility] DEPT Provider Note   CSN: [id number] Arrival date & time: 03/23/16  [id number]  First Provider Contact:  First MD Initiated Contact with Patient 03/23/16 1105        History   Chief Complaint Chief Complaint  Patient presents with  . Animal Bite    HPI [name] is a 75 y.o. male.  HPI   Pt is a 75 y/o male who presents to the ED with a dog bite to his right hand that occurred roughly 49min PTA. Pt states he picked up his 7lb poodle and it bit him. Pt is complaining of a laceration and pain to his right dorsal hand. Pain is constant, worse with touch. He has not tried anything for the pain. He denies numbness, tingling, weakness, fever, chills.  Past Medical History:  Diagnosis Date  . Arthritis    left knee  . Asthma    was on allergy shots and no problems 4-37yrs ago  . Basal cell carcinoma    "primarily on my face; I've had a bunch" (02/25/2013)  . Benign paroxysmal positional vertigo   . Cataracts, bilateral    immature  . Cautious gait    takes Carbidopa-Levodopa  . 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 History of blood transfusion    no abnormal reaction noted  . History of colon polyps   . Hyperlipidemia    is on med but unsure of name-to bring day of surgery  . Joint swelling    left knee  . 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 Obstructive hydrocephalus   . Reflux    takes Protonix daily  . Ulcerative colitis ([place])    takes Apriso daily  . Unspecified essential hypertension    takes Diovan daily    Patient Active Problem List   Diagnosis Date Noted  . Ulcerative colitis ([place]) 10/27/2014  . Melanoma of skin, site unspecified 12/06/2013  . Parkinsonism ([place]) 08/15/2013  . Rib fracture 08/12/2013  . Syncope 08/12/2013  . Murmur  03/14/2013  . Syncope and collapse 02/25/2013  . Memory loss   . Benign paroxysmal positional vertigo   . Essential hypertension   . Other and unspecified hyperlipidemia   . Abnormality of gait   . Obstructive hydrocephalus   . Gait disorder 02/04/2013    Past Surgical History:  Procedure Laterality Date  . BASAL CELL CARCINOMA EXCISION Left 2000's   points to nasal fold  (02/25/2013)  . COLONOSCOPY    . MELANOMA EXCISION WITH SENTINEL LYMPH NODE BIOPSY Left 10/25/2013   Procedure: WIDE EXCISION MELANOMA LEFT ARM WITH LEFT AXILLARY  SENTINEL LYMPH NODE BIOPSY;  Surgeon: Joyice Faster. Cornett, MD;  Location: [facility];  Service: General;  Laterality: Left;  . SUPRAPUBIC PROSTATECTOMY  02/21/2008   BPH       Home Medications    Prior to Admission medications   Medication Sig Start Date End Date Taking? Authorizing Provider  carbidopa-levodopa (SINEMET IR) 25-100 MG tablet TAKE  (1)  TABLET  THREE TIMES DAILY. 03/18/16  Yes Star Age, MD  lisinopril (PRINIVIL,ZESTRIL) 2.5 MG tablet Take 1 tablet by mouth daily. 02/25/16  Yes Historical Provider, MD  mesalamine (LIALDA) 1.2 G EC tablet Take 2.4 g by mouth daily with breakfast.    Yes Historical Provider, MD  pantoprazole (PROTONIX)  40 MG tablet Take 40 mg by mouth daily.    Yes Historical Provider, MD  pravastatin (PRAVACHOL) 20 MG tablet Take 20 mg by mouth daily.   Yes Historical Provider, MD  sertraline (ZOLOFT) 25 MG tablet Take 25 mg by mouth daily.  04/11/14  Yes Historical Provider, MD  valsartan (DIOVAN) 40 MG tablet Take 40 mg by mouth daily.   Yes Historical Provider, MD  vitamin B-12 (CYANOCOBALAMIN) 1000 MCG tablet Take 1,000 mcg by mouth every morning.   Yes Historical Provider, MD  amoxicillin-clavulanate (AUGMENTIN) 875-125 MG tablet Take 1 tablet by mouth every 12 (twelve) hours. 03/23/16   Kalman Drape, PA    Family History Family History  Problem Relation Age of Onset  . Heart failure Father   . CAD Father 8    Died  age 26  . Dementia Mother   . Heart attack Paternal Uncle     Social History Social History  Substance Use Topics  . Smoking status: Former Smoker    Years: 8.00    Types: Pipe  . Smokeless tobacco: Never Used     Comment: 02/25/2013 "quit smoking > 20 yr ago"  . Alcohol use 0.0 oz/week     Comment: glass of wine occasionally     Allergies   Lexapro [escitalopram oxalate] and Tetracyclines & related   Review of Systems Review of Systems  Constitutional: Negative for fever.  Gastrointestinal: Negative for nausea and vomiting.  Musculoskeletal: Positive for arthralgias. Negative for joint swelling.  Skin: Positive for wound.     Physical Exam Updated Vital Signs BP 110/77 (BP Location: Left Arm)   Pulse 62   Temp 97.7 F (36.5 C) (Oral)   Resp 17   Ht 5\' 7"  (1.702 m)   Wt 70.3 kg   SpO2 97%   BMI 24.28 kg/m   Physical Exam  Constitutional: He appears well-developed and well-nourished. No distress.  HENT:  Head: Normocephalic and atraumatic.  Eyes: Conjunctivae are normal.  Pulmonary/Chest: Effort normal. No respiratory distress.  Musculoskeletal: Normal range of motion.  Examination of right hand revealed roughly a 6cm laceration to dorsal aspect of hand, full ROM, TTP of laceration, no surrounding edema, no discharge, no signs of infection, sensation intact, brisk cap refill, pt neurovascularly intact distally  Neurological: He is alert. Coordination normal.  Skin: Skin is warm and dry. He is not diaphoretic.  Psychiatric: He has a normal mood and affect. His behavior is normal.  Nursing note and vitals reviewed.  Right hand    ED Treatments / Results  Labs (all labs ordered are listed, but only abnormal results are displayed) Labs Reviewed  CBG MONITORING, ED    EKG  EKG Interpretation None       Radiology Dg Hand Complete Right  Result Date: 03/23/2016 CLINICAL DATA:  Animal bite to the right hand. EXAM: RIGHT HAND - COMPLETE 3+ VIEW  COMPARISON:  None. FINDINGS: There is soft tissue gas and swelling in the dorsal right hand at the level of the second through fourth metacarpophalangeal joints. There is slight cortical irregularity in the dorsal distal aspect of the second and/or third metacarpals on the lateral view, which could represent a nondisplaced fracture. No dislocation. There is moderate polyarticular osteoarthritis involving the first carpometacarpal joint, interphalangeal joints of all of the fingers and second and third metacarpophalangeal joints . No radiopaque foreign body. IMPRESSION: 1. Soft tissue gas and swelling in the dorsal right hand at the level of the second through fourth  MCP joints. 2. Slight cortical irregularity in the dorsal distal aspect of the right second/third metacarpals on the lateral view, cannot exclude nondisplaced fracture. 3. Moderate polyarticular osteoarthritis as described. Electronically Signed   By: Ilona Sorrel M.D.   On: 03/23/2016 12:03    Procedures Procedures (including critical care time)  Medications Ordered in ED Medications  Tdap (BOOSTRIX) injection 0.5 mL (0.5 mLs Intramuscular Given 03/23/16 1117)     Initial Impression / Assessment and Plan / ED Course  I have reviewed the triage vital signs and the nursing notes.  Pertinent labs & imaging results that were available during my care of the patient were reviewed by me and considered in my medical decision making (see chart for details).  Clinical Course   Xrays reviewed by me were negative for foreign body, possible nondisplaced fracture of the right second and third metacarpals. Patient has full range of motion without pain. Patient's only pain is at the area the laceration on the surface. Patient is nontender on the palmar aspect of the hand. Presentation not concerning for ligamentous or tendon damage. Patient is neurovascularly intact distally. Will consult hand surgeon.  12:20pm spoke with Dr. Grandville Silos who agrees  that a few Steri-Strips are sutible in this patient case after thorough washing of the wound. He will have his office call the pt to set up an appointment for wound check this week. Thank you Dr. Grandville Silos for your consult and time.  Will d/c pt on Augmentin and instructed pt to f/u with Dr. Grandville Silos this week. TDap booster given. Pt's wife is checking on the rabies vaccine status of their dog. Discussed strict return precautions. Pt expressed understanding to the discharge instructions.   Pt case discussed with Dr. Thurnell Garbe who agrees with the above plan.  Final Clinical Impressions(s) / ED Diagnoses   Final diagnoses:  Dog bite of right hand, initial encounter  Laceration of right hand, initial encounter    New Prescriptions Discharge Medication List as of 03/23/2016 12:37 PM    START taking these medications   Details  amoxicillin-clavulanate (AUGMENTIN) 875-125 MG tablet Take 1 tablet by mouth every 12 (twelve) hours., Starting Sun 03/23/2016, [place], PA 03/23/16 [place], DO 03/25/16 [id number]

## 2016-03-23 NOTE — ED Notes (Signed)
PA in to assess 

## 2016-03-23 NOTE — Discharge Instructions (Signed)
Dr. Biagio Borg office will call you tomorrow to set up an appointment to be see to have your wound rechecked. Keep your wound clean and dry. Take the antibiotic as prescribed and be sure to complete the entire course. If you experience rash or diarrhea stop the antibiotic and return to the emergency department.   Return to the emergency department if you experience increased pain, redness, warmth, swelling or red streaks around your wound, fever, chills, numbness in your fingers or inability to use your fingers or hand.

## 2016-03-23 NOTE — ED Notes (Signed)
Animal control states someone has already been to house

## 2016-03-23 NOTE — ED Notes (Signed)
Pt waiting to be evaluated. Hand removed from soak. Pt states he has low blood sugar and request something to eat. Pt unable to eat at present.

## 2016-03-23 NOTE — ED Triage Notes (Signed)
pts dog bit him this am to right hand. Area wrapped. Per ems can see tendons.  A/o. Nad. Bleeding controlled.

## 2016-03-31 NOTE — Progress Notes (Signed)
Cardiology Office Note   Date:  04/02/2016   ID:  [name], DOB 1941-07-23, MRN [id number]  PCP:  Sherrie Mustache, MD  Cardiologist:   Minus Breeding, MD   Chief Complaint  Patient presents with  . Loss of Consciousness      History of Present Illness: [name] is a 75 y.o. male who presents for follow-up syncope. He was hospitalized last year.   He was thought to have some orthostasis as the cause of his syncope.  Workup did include an echocardiogram which was essentially unremarkable except for some mild basilar hypertrophy.  He has previously had multiple other workups including carotids and EEGs.  Since I last saw him he was in the ED with syncope this March and he had his Diovan stopped.  He was not admitted.  Somewhere along the line he was restarted on lisinopril since that visit and he doesn't recall the details. He's not had any lightheadedness, presyncope or syncope. He wears thigh-high compression stockings and he thinks this is helped. Of note he has cautious gate and leg weakness and gets around very slowly with a walker or cane or in a wheelchair.  The patient denies any new symptoms such as chest discomfort, neck or arm discomfort. There has been no new shortness of breath, PND or orthopnea. There have been no reported palpitations, presyncope or syncope.  Past Medical History:  Diagnosis Date  . Arthritis    left knee  . Asthma    was on allergy shots and no problems 4-51yrs ago  . Basal cell carcinoma    "primarily on my face; I've had a bunch" (02/25/2013)  . Benign paroxysmal positional vertigo   . Cataracts, bilateral    immature  . Cautious gait    takes Carbidopa-Levodopa  . 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 History of blood transfusion    no abnormal reaction noted  . History of  colon polyps   . Hyperlipidemia    is on med but unsure of name-to bring day of surgery  . Joint swelling    left knee  . 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 Obstructive hydrocephalus   . Reflux    takes Protonix daily  . Ulcerative colitis ([place])    takes Apriso daily  . Unspecified essential hypertension     Past Surgical History:  Procedure Laterality Date  . BASAL CELL CARCINOMA EXCISION Left 2000's   points to nasal fold  (02/25/2013)  . COLONOSCOPY    . MELANOMA EXCISION WITH SENTINEL LYMPH NODE BIOPSY Left 10/25/2013   Procedure: WIDE EXCISION MELANOMA LEFT ARM WITH LEFT AXILLARY  SENTINEL LYMPH NODE BIOPSY;  Surgeon: Joyice Faster. Cornett, MD;  Location: [facility];  Service: General;  Laterality: Left;  . SUPRAPUBIC PROSTATECTOMY  02/21/2008   BPH     Current Outpatient Prescriptions  Medication Sig Dispense Refill  . carbidopa-levodopa (SINEMET IR) 25-100 MG tablet TAKE  (1)  TABLET  THREE TIMES DAILY. 270 tablet 0  . lisinopril (PRINIVIL,ZESTRIL) 2.5 MG tablet Take 1 tablet by mouth daily.    . mesalamine (LIALDA) 1.2 G EC tablet Take 2.4 g by mouth daily with breakfast.     . pantoprazole (PROTONIX) 40 MG tablet Take 40 mg by mouth daily.     Marland Kitchen  pravastatin (PRAVACHOL) 20 MG tablet Take 20 mg by mouth daily.    . sertraline (ZOLOFT) 25 MG tablet Take 25 mg by mouth daily.     . vitamin B-12 (CYANOCOBALAMIN) 1000 MCG tablet Take 1,000 mcg by mouth every morning.     No current facility-administered medications for this visit.     Allergies:   Lexapro [escitalopram oxalate] and Tetracyclines & related    ROS:  Please see the history of present illness.   Otherwise, review of systems are positive for none.   All other systems are reviewed and negative.    PHYSICAL EXAM: VS:  BP 124/78   Pulse 67   Ht 5\' 7"  (1.702 m)   Wt 158 lb (71.7 kg)   BMI 24.75 kg/m  , BMI Body mass index is 24.75 kg/m. GENERAL:  Well appearing HEENT:   Pupils equal round and reactive, fundi not visualized, oral mucosa unremarkable NECK:  No jugular venous distention, waveform within normal limits, carotid upstroke brisk and symmetric, no bruits, no thyromegaly LYMPHATICS:  No cervical, inguinal adenopathy LUNGS:  Clear to auscultation bilaterally BACK:  No CVA tenderness CHEST:  Unremarkable HEART:  PMI not displaced or sustained,S1 and S2 within normal limits, no S3, no S4, no clicks, no rubs, brief apical systolic murmur heard at the right upper sternal border, no diastolic murmurs ABD:  Flat, positive bowel sounds normal in frequency in pitch, no bruits, no rebound, no guarding, no midline pulsatile mass, no hepatomegaly, no splenomegaly EXT:  2 plus pulses throughout, no edema, no cyanosis no clubbing   EKG:  EKG is  ordered today. Sinus rhythm, rate 67, axis within normal limits, intervals within normal limits, no acute ST-T wave changes.   Recent Labs: 10/18/2015: ALT 9; BUN 15; Creatinine, Ser 0.88; Hemoglobin 14.4; Platelets 179; Potassium 3.9; Sodium 138     Wt Readings from Last 3 Encounters:  04/02/16 158 lb (71.7 kg)  03/23/16 155 lb (70.3 kg)  10/18/15 155 lb (70.3 kg)      Other studies Reviewed: Additional studies/ records that were reviewed today include: ED records.  Review of the above records demonstrates:  Please see elsewhere in the note.     ASSESSMENT AND PLAN:  SYNCOPE:  The patient's had no further syncope. He seems to be tolerating the ACE inhibitor. At this point no change in therapy is indicated.  HTN:   He will continue the meds as listed.    Current medicines are reviewed at length with the patient today.  The patient does not have concerns regarding medicines.  The following changes have been made:  no change  Labs/ tests ordered today include: None    Disposition:   FU with me in 12 months   Signed, Minus Breeding, MD  04/02/2016 8:24 PM    [place]

## 2016-04-02 ENCOUNTER — Ambulatory Visit (INDEPENDENT_AMBULATORY_CARE_PROVIDER_SITE_OTHER): Payer: Medicare Other | Admitting: Cardiology

## 2016-04-02 ENCOUNTER — Encounter: Payer: Self-pay | Admitting: Cardiology

## 2016-04-02 VITALS — BP 124/78 | HR 67 | Ht 67.0 in | Wt 158.0 lb

## 2016-04-02 DIAGNOSIS — I10 Essential (primary) hypertension: Secondary | ICD-10-CM

## 2016-04-02 NOTE — Patient Instructions (Signed)
Medication Instructions:  The current medical regimen is effective;  continue present plan and medications.  Follow-Up: Follow up in 1 year with Dr. Hochrein.  You will receive a letter in the mail 2 months before you are due.  Please call us when you receive this letter to schedule your follow up appointment.  Thank you for choosing [facility]!!      

## 2016-05-08 ENCOUNTER — Other Ambulatory Visit: Payer: Self-pay | Admitting: Neurology

## 2019-05-23 DIAGNOSIS — G20 Parkinson's disease: Secondary | ICD-10-CM | POA: Diagnosis not present

## 2019-05-23 DIAGNOSIS — K219 Gastro-esophageal reflux disease without esophagitis: Secondary | ICD-10-CM | POA: Diagnosis not present

## 2019-05-23 DIAGNOSIS — I10 Essential (primary) hypertension: Secondary | ICD-10-CM | POA: Diagnosis not present

## 2019-06-07 DIAGNOSIS — Z8669 Personal history of other diseases of the nervous system and sense organs: Secondary | ICD-10-CM | POA: Diagnosis not present

## 2019-06-07 DIAGNOSIS — R2681 Unsteadiness on feet: Secondary | ICD-10-CM | POA: Diagnosis not present

## 2019-06-07 DIAGNOSIS — I10 Essential (primary) hypertension: Secondary | ICD-10-CM | POA: Diagnosis not present

## 2019-06-07 DIAGNOSIS — Z9181 History of falling: Secondary | ICD-10-CM | POA: Diagnosis not present

## 2019-06-07 DIAGNOSIS — G20 Parkinson's disease: Secondary | ICD-10-CM | POA: Diagnosis not present

## 2020-08-14 DIAGNOSIS — R5381 Other malaise: Secondary | ICD-10-CM | POA: Diagnosis not present

## 2020-08-14 DIAGNOSIS — Z9181 History of falling: Secondary | ICD-10-CM | POA: Diagnosis not present

## 2020-08-14 DIAGNOSIS — F3342 Major depressive disorder, recurrent, in full remission: Secondary | ICD-10-CM | POA: Diagnosis not present

## 2020-08-14 DIAGNOSIS — Z8669 Personal history of other diseases of the nervous system and sense organs: Secondary | ICD-10-CM | POA: Diagnosis not present

## 2020-08-14 DIAGNOSIS — R2681 Unsteadiness on feet: Secondary | ICD-10-CM | POA: Diagnosis not present

## 2020-08-14 DIAGNOSIS — G20 Parkinson's disease: Secondary | ICD-10-CM | POA: Diagnosis not present

## 2020-08-30 DIAGNOSIS — I10 Essential (primary) hypertension: Secondary | ICD-10-CM | POA: Diagnosis not present

## 2020-08-30 DIAGNOSIS — N3281 Overactive bladder: Secondary | ICD-10-CM | POA: Diagnosis not present

## 2020-08-30 DIAGNOSIS — G20 Parkinson's disease: Secondary | ICD-10-CM | POA: Diagnosis not present

## 2020-08-30 DIAGNOSIS — E785 Hyperlipidemia, unspecified: Secondary | ICD-10-CM | POA: Diagnosis not present

## 2020-08-30 DIAGNOSIS — F32A Depression, unspecified: Secondary | ICD-10-CM | POA: Diagnosis not present

## 2020-08-30 DIAGNOSIS — R42 Dizziness and giddiness: Secondary | ICD-10-CM | POA: Diagnosis not present

## 2020-08-30 DIAGNOSIS — K219 Gastro-esophageal reflux disease without esophagitis: Secondary | ICD-10-CM | POA: Diagnosis not present

## 2020-08-30 DIAGNOSIS — R011 Cardiac murmur, unspecified: Secondary | ICD-10-CM | POA: Diagnosis not present

## 2020-09-03 DIAGNOSIS — G20 Parkinson's disease: Secondary | ICD-10-CM | POA: Diagnosis not present

## 2020-09-03 DIAGNOSIS — K219 Gastro-esophageal reflux disease without esophagitis: Secondary | ICD-10-CM | POA: Diagnosis not present

## 2020-09-03 DIAGNOSIS — N3281 Overactive bladder: Secondary | ICD-10-CM | POA: Diagnosis not present

## 2020-09-03 DIAGNOSIS — E785 Hyperlipidemia, unspecified: Secondary | ICD-10-CM | POA: Diagnosis not present

## 2020-09-03 DIAGNOSIS — I10 Essential (primary) hypertension: Secondary | ICD-10-CM | POA: Diagnosis not present

## 2020-09-03 DIAGNOSIS — R42 Dizziness and giddiness: Secondary | ICD-10-CM | POA: Diagnosis not present

## 2020-09-03 DIAGNOSIS — R011 Cardiac murmur, unspecified: Secondary | ICD-10-CM | POA: Diagnosis not present

## 2020-10-01 DIAGNOSIS — K219 Gastro-esophageal reflux disease without esophagitis: Secondary | ICD-10-CM | POA: Diagnosis not present

## 2020-10-01 DIAGNOSIS — R011 Cardiac murmur, unspecified: Secondary | ICD-10-CM | POA: Diagnosis not present

## 2020-10-01 DIAGNOSIS — R42 Dizziness and giddiness: Secondary | ICD-10-CM | POA: Diagnosis not present

## 2020-10-01 DIAGNOSIS — G20 Parkinson's disease: Secondary | ICD-10-CM | POA: Diagnosis not present

## 2020-10-01 DIAGNOSIS — F32A Depression, unspecified: Secondary | ICD-10-CM | POA: Diagnosis not present

## 2020-10-01 DIAGNOSIS — E785 Hyperlipidemia, unspecified: Secondary | ICD-10-CM | POA: Diagnosis not present

## 2020-10-01 DIAGNOSIS — N3281 Overactive bladder: Secondary | ICD-10-CM | POA: Diagnosis not present

## 2020-10-04 DIAGNOSIS — K219 Gastro-esophageal reflux disease without esophagitis: Secondary | ICD-10-CM | POA: Diagnosis not present

## 2020-10-04 DIAGNOSIS — F32A Depression, unspecified: Secondary | ICD-10-CM | POA: Diagnosis not present

## 2020-10-04 DIAGNOSIS — R42 Dizziness and giddiness: Secondary | ICD-10-CM | POA: Diagnosis not present

## 2020-10-04 DIAGNOSIS — I10 Essential (primary) hypertension: Secondary | ICD-10-CM | POA: Diagnosis not present

## 2020-10-04 DIAGNOSIS — E785 Hyperlipidemia, unspecified: Secondary | ICD-10-CM | POA: Diagnosis not present

## 2020-10-04 DIAGNOSIS — R011 Cardiac murmur, unspecified: Secondary | ICD-10-CM | POA: Diagnosis not present

## 2020-10-04 DIAGNOSIS — G20 Parkinson's disease: Secondary | ICD-10-CM | POA: Diagnosis not present

## 2020-10-11 DIAGNOSIS — E785 Hyperlipidemia, unspecified: Secondary | ICD-10-CM | POA: Diagnosis not present

## 2020-10-11 DIAGNOSIS — I10 Essential (primary) hypertension: Secondary | ICD-10-CM | POA: Diagnosis not present

## 2020-10-29 DIAGNOSIS — E785 Hyperlipidemia, unspecified: Secondary | ICD-10-CM | POA: Diagnosis not present

## 2020-10-29 DIAGNOSIS — R011 Cardiac murmur, unspecified: Secondary | ICD-10-CM | POA: Diagnosis not present

## 2020-10-29 DIAGNOSIS — I10 Essential (primary) hypertension: Secondary | ICD-10-CM | POA: Diagnosis not present

## 2020-10-29 DIAGNOSIS — R42 Dizziness and giddiness: Secondary | ICD-10-CM | POA: Diagnosis not present

## 2020-10-29 DIAGNOSIS — G20 Parkinson's disease: Secondary | ICD-10-CM | POA: Diagnosis not present

## 2020-10-29 DIAGNOSIS — N3281 Overactive bladder: Secondary | ICD-10-CM | POA: Diagnosis not present

## 2020-10-29 DIAGNOSIS — K219 Gastro-esophageal reflux disease without esophagitis: Secondary | ICD-10-CM | POA: Diagnosis not present

## 2020-10-29 DIAGNOSIS — F32A Depression, unspecified: Secondary | ICD-10-CM | POA: Diagnosis not present

## 2020-11-01 DIAGNOSIS — R42 Dizziness and giddiness: Secondary | ICD-10-CM | POA: Diagnosis not present

## 2020-11-01 DIAGNOSIS — G20 Parkinson's disease: Secondary | ICD-10-CM | POA: Diagnosis not present

## 2020-11-01 DIAGNOSIS — R011 Cardiac murmur, unspecified: Secondary | ICD-10-CM | POA: Diagnosis not present

## 2020-11-01 DIAGNOSIS — F32A Depression, unspecified: Secondary | ICD-10-CM | POA: Diagnosis not present

## 2020-11-01 DIAGNOSIS — I10 Essential (primary) hypertension: Secondary | ICD-10-CM | POA: Diagnosis not present

## 2020-11-01 DIAGNOSIS — K219 Gastro-esophageal reflux disease without esophagitis: Secondary | ICD-10-CM | POA: Diagnosis not present

## 2020-11-01 DIAGNOSIS — E785 Hyperlipidemia, unspecified: Secondary | ICD-10-CM | POA: Diagnosis not present

## 2020-11-01 DIAGNOSIS — N3281 Overactive bladder: Secondary | ICD-10-CM | POA: Diagnosis not present

## 2020-11-08 DIAGNOSIS — H01005 Unspecified blepharitis left lower eyelid: Secondary | ICD-10-CM | POA: Diagnosis not present

## 2020-11-08 DIAGNOSIS — H01004 Unspecified blepharitis left upper eyelid: Secondary | ICD-10-CM | POA: Diagnosis not present

## 2020-11-08 DIAGNOSIS — H25813 Combined forms of age-related cataract, bilateral: Secondary | ICD-10-CM | POA: Diagnosis not present

## 2020-11-08 DIAGNOSIS — H01001 Unspecified blepharitis right upper eyelid: Secondary | ICD-10-CM | POA: Diagnosis not present

## 2020-11-08 DIAGNOSIS — H01002 Unspecified blepharitis right lower eyelid: Secondary | ICD-10-CM | POA: Diagnosis not present

## 2020-11-26 DIAGNOSIS — R011 Cardiac murmur, unspecified: Secondary | ICD-10-CM | POA: Diagnosis not present

## 2020-11-26 DIAGNOSIS — F32A Depression, unspecified: Secondary | ICD-10-CM | POA: Diagnosis not present

## 2020-11-26 DIAGNOSIS — I10 Essential (primary) hypertension: Secondary | ICD-10-CM | POA: Diagnosis not present

## 2020-11-26 DIAGNOSIS — K219 Gastro-esophageal reflux disease without esophagitis: Secondary | ICD-10-CM | POA: Diagnosis not present

## 2020-11-26 DIAGNOSIS — E785 Hyperlipidemia, unspecified: Secondary | ICD-10-CM | POA: Diagnosis not present

## 2020-11-26 DIAGNOSIS — G20 Parkinson's disease: Secondary | ICD-10-CM | POA: Diagnosis not present

## 2020-11-26 DIAGNOSIS — N3281 Overactive bladder: Secondary | ICD-10-CM | POA: Diagnosis not present

## 2020-11-26 DIAGNOSIS — H04123 Dry eye syndrome of bilateral lacrimal glands: Secondary | ICD-10-CM | POA: Diagnosis not present

## 2020-11-26 DIAGNOSIS — R42 Dizziness and giddiness: Secondary | ICD-10-CM | POA: Diagnosis not present

## 2020-11-30 DIAGNOSIS — I10 Essential (primary) hypertension: Secondary | ICD-10-CM | POA: Diagnosis not present

## 2020-11-30 DIAGNOSIS — E785 Hyperlipidemia, unspecified: Secondary | ICD-10-CM | POA: Diagnosis not present

## 2020-12-10 DIAGNOSIS — H25811 Combined forms of age-related cataract, right eye: Secondary | ICD-10-CM | POA: Diagnosis not present

## 2020-12-11 NOTE — H&P (Signed)
Surgical History & Physical  Patient Name: [name] DOB: Jun 27, 1941  Surgery: Cataract extraction with intraocular lens implant phacoemulsification; Right Eye  Surgeon: Baruch Goldmann MD Surgery Date:  12/17/2020 Pre-Op Date:  12/11/2020  HPI: A 32 Yr. old male patient 1. 1. The patient complains of difficulty when reading fine print, books, newspaper, instructions etc., which began 1 year ago. Both eyes are affected. The episode is gradual. The condition's severity increased since last visit. Symptoms occur when the patient is inside. This is negatively affecting the patient's quality of life. HPI was performed by Baruch Goldmann .  Medical History: Cataracts High Blood Pressure Hypercholesterolemia, Parkinson's disease  Review of Systems Negative Allergic/Immunologic Negative Cardiovascular Negative Constitutional Negative Ear, Nose, Mouth & Throat Negative Endocrine Negative Eyes Negative Gastrointestinal Negative Genitourinary Negative Hemotologic/Lymphatic Negative Integumentary Negative Musculoskeletal Negative Neurological Negative Psychiatry Negative Respiratory  Social   Never smoked    Medication Acetaminophinen, Bacitracin, Carbidopa, Lisinopril, Meclizine, Mesalamine, Mupirocin, Pantoprazole, Pravastatin, Sertraline, Tolterodine, Sinemet,   Sx/Procedures Prostate sx,   Drug Allergies  Tetracycline,   History & Physical: Heent:  Cataract, Right eye NECK: supple without bruits LUNGS: lungs clear to auscultation CV: regular rate and rhythm Abdomen: soft and non-tender  Impression & Plan: Assessment: 1.  COMBINED FORMS AGE RELATED CATARACT; Both Eyes  (H25.813) 2.  BLEPHARITIS; Right Upper Lid, Right Lower Lid, Left Upper Lid, Left Lower Lid (H01.001, H01.002,H01.004,H01.005)  Plan: 1.  Cataract accounts for the patient's decreased vision. This visual impairment is not correctable with a tolerable change in glasses or contact lenses. Cataract surgery  with an implantation of a new lens should significantly improve the visual and functional status of the patient. Discussed all risks, benefits, alternatives, and potential complications. Discussed the procedures and recovery. Patient desires to have surgery. A-scan ordered and performed today for intra-ocular lens calculations. The surgery will be performed in order to improve vision for driving, reading, and for eye examinations. Recommend phacoemulsification with intra-ocular lens. Recommend Dextenza for post-operative pain and inflammation. Right Eye worse - first. Dilates well - shugarcaine by protocol. 2.  recommend regular lid cleaning.

## 2020-12-12 ENCOUNTER — Encounter (HOSPITAL_COMMUNITY)
Admission: RE | Admit: 2020-12-12 | Discharge: 2020-12-12 | Disposition: A | Discharge status: Home / Self Care | Payer: PRIVATE HEALTH INSURANCE | Source: Ambulatory Visit | Attending: Ophthalmology | Admitting: Ophthalmology

## 2020-12-12 ENCOUNTER — Other Ambulatory Visit: Payer: Self-pay

## 2020-12-14 ENCOUNTER — Other Ambulatory Visit: Payer: Self-pay

## 2020-12-14 ENCOUNTER — Encounter (HOSPITAL_COMMUNITY): Payer: Self-pay

## 2020-12-14 NOTE — Pre-Procedure Instructions (Signed)
Spoke with Joycelyn Schmid, RN at [facility] in [place] and went over preop information. She states patient os alert and oriented but cannot stand and need hoya lift for transfer. Wyman Songster will be meeting patient at the hospital [phone]). We went over arrival time, NPO after midnight except meds and no jewelery, lotions, powders or colognes.She verbalized understanding of this.

## 2020-12-14 NOTE — Pre-Procedure Instructions (Signed)
Have attempted multiple times to Call number listed [phone]. This is the only number listed for patient and spouse. The message I keep getting is that it is not in service at this time, try call later. There is also a note in the patients FYI header that he is in  Facility, but not which one so I have no other way to contact him. I called Lyndel Safe surgical coordinator to see if she has any other numbers I can try.

## 2020-12-17 ENCOUNTER — Encounter (HOSPITAL_COMMUNITY)
Admission: RE | Disposition: A | Discharge status: Home / Self Care | Payer: Self-pay | Source: Home / Self Care | Attending: Ophthalmology

## 2020-12-17 ENCOUNTER — Encounter (HOSPITAL_COMMUNITY): Payer: Self-pay | Admitting: Ophthalmology

## 2020-12-17 ENCOUNTER — Ambulatory Visit (HOSPITAL_COMMUNITY)
Admission: RE | Admit: 2020-12-17 | Discharge: 2020-12-17 | Disposition: A | Discharge status: Home / Self Care | Payer: Medicare HMO | Attending: Ophthalmology | Admitting: Ophthalmology

## 2020-12-17 ENCOUNTER — Ambulatory Visit (HOSPITAL_COMMUNITY): Payer: Medicare HMO | Admitting: Anesthesiology

## 2020-12-17 ENCOUNTER — Other Ambulatory Visit (HOSPITAL_COMMUNITY)
Admission: RE | Admit: 2020-12-17 | Discharge: 2020-12-17 | Disposition: A | Discharge status: Home / Self Care | Payer: Medicare HMO | Source: Ambulatory Visit | Attending: Ophthalmology | Admitting: Ophthalmology

## 2020-12-17 DIAGNOSIS — Z20822 Contact with and (suspected) exposure to covid-19: Secondary | ICD-10-CM | POA: Diagnosis not present

## 2020-12-17 DIAGNOSIS — H25811 Combined forms of age-related cataract, right eye: Secondary | ICD-10-CM | POA: Diagnosis not present

## 2020-12-17 DIAGNOSIS — H269 Unspecified cataract: Secondary | ICD-10-CM | POA: Diagnosis not present

## 2020-12-17 DIAGNOSIS — G20 Parkinson's disease: Secondary | ICD-10-CM | POA: Insufficient documentation

## 2020-12-17 DIAGNOSIS — Z881 Allergy status to other antibiotic agents status: Secondary | ICD-10-CM | POA: Insufficient documentation

## 2020-12-17 DIAGNOSIS — H0100A Unspecified blepharitis right eye, upper and lower eyelids: Secondary | ICD-10-CM | POA: Insufficient documentation

## 2020-12-17 DIAGNOSIS — E7800 Pure hypercholesterolemia, unspecified: Secondary | ICD-10-CM | POA: Diagnosis not present

## 2020-12-17 HISTORY — PX: CATARACT EXTRACTION W/PHACO: SHX586

## 2020-12-17 LAB — SARS CORONAVIRUS 2 BY RT PCR (HOSPITAL ORDER, PERFORMED IN ~~LOC~~ HOSPITAL LAB): SARS Coronavirus 2: NEGATIVE

## 2020-12-17 SURGERY — PHACOEMULSIFICATION, CATARACT, WITH IOL INSERTION
Anesthesia: Monitor Anesthesia Care | Site: Eye | Laterality: Right

## 2020-12-17 MED ORDER — SODIUM HYALURONATE 23MG/ML IO SOSY
PREFILLED_SYRINGE | INTRAOCULAR | Status: DC | PRN
  Administered 2020-12-17: 0.6 mL via INTRAOCULAR

## 2020-12-17 MED ORDER — TETRACAINE HCL 0.5 % OP SOLN
1.0000 [drp] | OPHTHALMIC | Status: AC | PRN
  Administered 2020-12-17 (×3): 1 [drp] via OPHTHALMIC

## 2020-12-17 MED ORDER — BSS IO SOLN
INTRAOCULAR | Status: DC | PRN
  Administered 2020-12-17: 15 mL via INTRAOCULAR

## 2020-12-17 MED ORDER — EPINEPHRINE PF 1 MG/ML IJ SOLN
INTRAOCULAR | Status: DC | PRN
  Administered 2020-12-17: 500 mL

## 2020-12-17 MED ORDER — POVIDONE-IODINE 5 % OP SOLN
OPHTHALMIC | Status: DC | PRN
  Administered 2020-12-17: 1 via OPHTHALMIC

## 2020-12-17 MED ORDER — TROPICAMIDE 1 % OP SOLN
1.0000 [drp] | OPHTHALMIC | Status: AC
  Administered 2020-12-17 (×3): 1 [drp] via OPHTHALMIC

## 2020-12-17 MED ORDER — STERILE WATER FOR IRRIGATION IR SOLN
Status: DC | PRN
  Administered 2020-12-17: 250 mL

## 2020-12-17 MED ORDER — PHENYLEPHRINE HCL 2.5 % OP SOLN
1.0000 [drp] | OPHTHALMIC | Status: AC | PRN
  Administered 2020-12-17 (×3): 1 [drp] via OPHTHALMIC

## 2020-12-17 MED ORDER — EPINEPHRINE PF 1 MG/ML IJ SOLN
INTRAMUSCULAR | Status: AC
  Filled 2020-12-17: qty 2

## 2020-12-17 MED ORDER — SODIUM HYALURONATE 10 MG/ML IO SOLUTION
PREFILLED_SYRINGE | INTRAOCULAR | Status: DC | PRN
  Administered 2020-12-17: 0.85 mL via INTRAOCULAR

## 2020-12-17 MED ORDER — LIDOCAINE HCL 3.5 % OP GEL
1.0000 "application " | Freq: Once | OPHTHALMIC | Status: AC
  Administered 2020-12-17: 1 via OPHTHALMIC

## 2020-12-17 MED ORDER — NEOMYCIN-POLYMYXIN-DEXAMETH 3.5-10000-0.1 OP SUSP
OPHTHALMIC | Status: DC | PRN
  Administered 2020-12-17: 1 [drp] via OPHTHALMIC

## 2020-12-17 MED ORDER — LIDOCAINE HCL (PF) 1 % IJ SOLN
INTRAOCULAR | Status: DC | PRN
  Administered 2020-12-17: 1 mL via OPHTHALMIC

## 2020-12-17 SURGICAL SUPPLY — 13 items
CLOTH BEACON ORANGE TIMEOUT ST (SAFETY) ×1 IMPLANT
EYE SHIELD UNIVERSAL CLEAR (GAUZE/BANDAGES/DRESSINGS) ×1 IMPLANT
GLOVE SURG UNDER POLY LF SZ6.5 (GLOVE) ×1 IMPLANT
GLOVE SURG UNDER POLY LF SZ7 (GLOVE) ×2 IMPLANT
NDL HYPO 18GX1.5 BLUNT FILL (NEEDLE) IMPLANT
NEEDLE HYPO 18GX1.5 BLUNT FILL (NEEDLE) ×2 IMPLANT
PAD ARMBOARD 7.5X6 YLW CONV (MISCELLANEOUS) ×1 IMPLANT
SYR TB 1ML LL NO SAFETY (SYRINGE) ×1 IMPLANT
TAPE SURG TRANSPORE 1 IN (GAUZE/BANDAGES/DRESSINGS) IMPLANT
TAPE SURGICAL TRANSPORE 1 IN (GAUZE/BANDAGES/DRESSINGS) ×2
TECNIS 1 PIECE IOL (Intraocular Lens) ×1 IMPLANT
VISCOELASTIC ADDITIONAL (OPHTHALMIC RELATED) IMPLANT
WATER STERILE IRR 250ML POUR (IV SOLUTION) ×1 IMPLANT

## 2020-12-17 NOTE — Anesthesia Preprocedure Evaluation (Addendum)
Anesthesia Evaluation  Patient identified by MRN, date of birth, ID band Patient awake    Reviewed: Allergy & Precautions, NPO status , Patient's Chart, lab work & pertinent test results  History of Anesthesia Complications (+) history of anesthetic complications (gait disturbance after prostatectomy )  Airway Mallampati: III  TM Distance: >3 FB Neck ROM: Full    Dental  (+) Dental Advisory Given   Pulmonary asthma , former smoker,    Pulmonary exam normal breath sounds clear to auscultation       Cardiovascular Exercise Tolerance: Poor METS (gait disturbance): hypertension, Pt. on medications Normal cardiovascular exam+ Valvular Problems/Murmurs  Rhythm:Regular Rate:Normal     Neuro/Psych  Neuromuscular disease (parkinsonism) negative psych ROS   GI/Hepatic Neg liver ROS, PUD,   Endo/Other  negative endocrine ROS  Renal/GU negative Renal ROS     Musculoskeletal  (+) Arthritis ,   Abdominal   Peds  Hematology   Anesthesia Other Findings Vertigo   Reproductive/Obstetrics                           Anesthesia Physical Anesthesia Plan  ASA: III  Anesthesia Plan: MAC   Post-op Pain Management:    Induction:   PONV Risk Score and Plan:   Airway Management Planned: Nasal Cannula and Natural Airway  Additional Equipment:   Intra-op Plan:   Post-operative Plan:   Informed Consent: I have reviewed the patients History and Physical, chart, labs and discussed the procedure including the risks, benefits and alternatives for the proposed anesthesia with the patient or authorized representative who has indicated his/her understanding and acceptance.     Dental advisory given  Plan Discussed with: CRNA and Surgeon  Anesthesia Plan Comments:        Anesthesia Quick Evaluation

## 2020-12-17 NOTE — Discharge Instructions (Signed)
Please discharge patient when stable, will follow up today with Dr. Korri Ask at the [facility] [place] office immediately following discharge.  Leave shield in place until visit.  All paperwork with discharge instructions will be given at the office.  [facility] Address:  [address]  [place],  [address]  

## 2020-12-17 NOTE — Anesthesia Postprocedure Evaluation (Signed)
Anesthesia Post Note  Patient: [name]  Procedure(s) Performed: CATARACT EXTRACTION PHACO AND INTRAOCULAR LENS PLACEMENT RIGHT EYE (Right Eye)  Patient location during evaluation: Short Stay Anesthesia Type: MAC Level of consciousness: awake and alert Pain management: pain level controlled Vital Signs Assessment: post-procedure vital signs reviewed and stable Respiratory status: nonlabored ventilation Cardiovascular status: blood pressure returned to baseline and stable Postop Assessment: no apparent nausea or vomiting Anesthetic complications: no   No complications documented.   Last Vitals:  Vitals:   12/17/20 0930 12/17/20 1030  BP: 132/78 130/77  Pulse: 68 65  Resp: 18 16  Temp: 36.6 C 36.6 C  SpO2: 99% 98%    Last Pain:  Vitals:   12/17/20 1030  TempSrc: Oral  PainSc: 0-No pain                 Trayton Szabo

## 2020-12-17 NOTE — Transfer of Care (Signed)
Immediate Anesthesia Transfer of Care Note  Patient: [name]  Procedure(s) Performed: CATARACT EXTRACTION PHACO AND INTRAOCULAR LENS PLACEMENT RIGHT EYE (Right Eye)  Patient Location: Short Stay  Anesthesia Type:MAC  Level of Consciousness: awake  Airway & Oxygen Therapy: Patient Spontanous Breathing  Post-op Assessment: Report given to RN  Post vital signs: Reviewed and stable  Last Vitals:  Vitals Value Taken Time  BP    Temp    Pulse    Resp    SpO2      Last Pain:  Vitals:   12/17/20 0930  TempSrc: Oral  PainSc: 0-No pain         Complications: No complications documented.

## 2020-12-17 NOTE — Interval H&P Note (Signed)
History and Physical Interval Note:  12/17/2020 9:59 AM  [name]  has presented today for surgery, with the diagnosis of Nuclear sclerotic cataract - Right eye.  The various methods of treatment have been discussed with the patient and family. After consideration of risks, benefits and other options for treatment, the patient has consented to  Procedure(s) with comments: CATARACT EXTRACTION PHACO AND INTRAOCULAR LENS PLACEMENT RIGHT EYE (Right) - right as a surgical intervention.  The patient's history has been reviewed, patient examined, no change in status, stable for surgery.  I have reviewed the patient's chart and labs.  Questions were answered to the patient's satisfaction.     Baruch Goldmann

## 2020-12-17 NOTE — Op Note (Signed)
Date of procedure: 12/17/20  Pre-operative diagnosis:  Visually significant combined form age-related cataract, Right Eye (H25.811)  Post-operative diagnosis:  Visually significant combined form age-related cataract, Right Eye (H25.811)  Procedure: Removal of cataract via phacoemulsification and insertion of intra-ocular lens [employer] [id number]  +21.0D into the capsular bag of the Right Eye  Attending surgeon: Gerda Diss. Darel Ricketts, MD, MA  Anesthesia: MAC, Topical Akten  Complications: None  Estimated Blood Loss: <51m (minimal)  Specimens: None  Implants: As above  Indications:  Visually significant age-related cataract, Right Eye  Procedure:  The patient was seen and identified in the pre-operative area. The operative eye was identified and dilated.  The operative eye was marked.  Topical anesthesia was administered to the operative eye.     The patient was then to the operative suite and placed in the supine position.  A timeout was performed confirming the patient, procedure to be performed, and all other relevant information.   The patient's face was prepped and draped in the usual fashion for intra-ocular surgery.  A lid speculum was placed into the operative eye and the surgical microscope moved into place and focused.  A superotemporal paracentesis was created using a 20 gauge paracentesis blade.  Shugarcaine was injected into the anterior chamber.  Viscoelastic was injected into the anterior chamber.  A temporal clear-corneal main wound incision was created using a 2.443mmicrokeratome.  A continuous curvilinear capsulorrhexis was initiated using an irrigating cystitome and completed using capsulorrhexis forceps.  Hydrodissection and hydrodeliniation were performed.  Viscoelastic was injected into the anterior chamber.  A phacoemulsification handpiece and a chopper as a second instrument were used to remove the nucleus and epinucleus. The irrigation/aspiration handpiece was  used to remove any remaining cortical material.   The capsular bag was reinflated with viscoelastic, checked, and found to be intact.  The intraocular lens was inserted into the capsular bag.  The irrigation/aspiration handpiece was used to remove any remaining viscoelastic.  The clear corneal wound and paracentesis wounds were then hydrated and checked with Weck-Cels to be watertight.  The lid-speculum was removed.  The drape was removed.  The patient's face was cleaned with a wet and dry 4x4.   Maxitrol was instilled in the eye. A clear shield was taped over the eye. The patient was taken to the post-operative care unit in good condition, having tolerated the procedure well.  Post-Op Instructions: [facility] and will receive all other orders and instructions.

## 2020-12-18 ENCOUNTER — Encounter (HOSPITAL_COMMUNITY): Payer: Self-pay | Admitting: Ophthalmology

## 2020-12-20 DIAGNOSIS — N3281 Overactive bladder: Secondary | ICD-10-CM | POA: Diagnosis not present

## 2020-12-20 DIAGNOSIS — K219 Gastro-esophageal reflux disease without esophagitis: Secondary | ICD-10-CM | POA: Diagnosis not present

## 2020-12-20 DIAGNOSIS — H04123 Dry eye syndrome of bilateral lacrimal glands: Secondary | ICD-10-CM | POA: Diagnosis not present

## 2020-12-20 DIAGNOSIS — R42 Dizziness and giddiness: Secondary | ICD-10-CM | POA: Diagnosis not present

## 2020-12-20 DIAGNOSIS — R011 Cardiac murmur, unspecified: Secondary | ICD-10-CM | POA: Diagnosis not present

## 2020-12-20 DIAGNOSIS — E785 Hyperlipidemia, unspecified: Secondary | ICD-10-CM | POA: Diagnosis not present

## 2020-12-20 DIAGNOSIS — I10 Essential (primary) hypertension: Secondary | ICD-10-CM | POA: Diagnosis not present

## 2020-12-21 DIAGNOSIS — R1110 Vomiting, unspecified: Secondary | ICD-10-CM | POA: Diagnosis not present

## 2020-12-21 DIAGNOSIS — I160 Hypertensive urgency: Secondary | ICD-10-CM | POA: Diagnosis not present

## 2020-12-24 DIAGNOSIS — R011 Cardiac murmur, unspecified: Secondary | ICD-10-CM | POA: Diagnosis not present

## 2020-12-24 DIAGNOSIS — G20 Parkinson's disease: Secondary | ICD-10-CM | POA: Diagnosis not present

## 2020-12-24 DIAGNOSIS — E785 Hyperlipidemia, unspecified: Secondary | ICD-10-CM | POA: Diagnosis not present

## 2020-12-24 DIAGNOSIS — R42 Dizziness and giddiness: Secondary | ICD-10-CM | POA: Diagnosis not present

## 2020-12-24 DIAGNOSIS — K219 Gastro-esophageal reflux disease without esophagitis: Secondary | ICD-10-CM | POA: Diagnosis not present

## 2020-12-24 DIAGNOSIS — H04123 Dry eye syndrome of bilateral lacrimal glands: Secondary | ICD-10-CM | POA: Diagnosis not present

## 2020-12-24 DIAGNOSIS — I10 Essential (primary) hypertension: Secondary | ICD-10-CM | POA: Diagnosis not present

## 2020-12-24 DIAGNOSIS — H25812 Combined forms of age-related cataract, left eye: Secondary | ICD-10-CM | POA: Diagnosis not present

## 2020-12-24 DIAGNOSIS — N3281 Overactive bladder: Secondary | ICD-10-CM | POA: Diagnosis not present

## 2020-12-25 ENCOUNTER — Other Ambulatory Visit: Payer: Self-pay

## 2020-12-25 ENCOUNTER — Encounter (HOSPITAL_COMMUNITY)
Admit: 2020-12-25 | Discharge: 2020-12-25 | Disposition: A | Discharge status: Home / Self Care | Payer: Medicare HMO | Attending: Ophthalmology | Admitting: Ophthalmology

## 2020-12-25 NOTE — H&P (Signed)
Surgical History & Physical  Patient Name: [name] DOB: [birth year]  Surgery: Cataract extraction with intraocular lens implant phacoemulsification; Left Eye  Surgeon: Baruch Goldmann MD Surgery Date:  12/31/2020 Pre-Op Date:  12/24/2020  HPI: A 58 Yr. old male patient 1. The patient complains of difficulty when reading fine print, books, newspaper, instructions etc., which began 1 year ago. The left eye is affected. The episode is gradual. The condition's severity increased since last visit. Symptoms occur when the patient is inside and outside. 2. The patient is returning after cataract post-op. The right eye is affected. Status post cataract post-op, which began 1 week ago: Since the last visit, the affected area is doing well. The patient's vision is improved. Patient is following medication instructions. HPI Completed by Dr. Baruch Goldmann  Medical History: Cataracts High Blood Pressure Hypercholesterolemia, Parkinson's disease  Review of Systems Negative Allergic/Immunologic Negative Cardiovascular Negative Constitutional Negative Ear, Nose, Mouth & Throat Negative Endocrine Negative Eyes Negative Gastrointestinal Negative Genitourinary Negative Hemotologic/Lymphatic Negative Integumentary Negative Musculoskeletal Negative Neurological Negative Psychiatry Negative Respiratory  Social   Never smoked   Medication Ilevro, Moxifloxacin,  Acetaminophinen, Bacitracin, Carbidopa, Lisinopril, Meclizine, Mesalamine, Mupirocin, Pantoprazole, Pravastatin, Sertraline, Tolterodine, Sinemet,   Sx/Procedures Phaco c IOL OD,  Prostate sx,   Drug Allergies  Tetracycline,   History & Physical: Heent:  Cataract, Left eye NECK: supple without bruits LUNGS: lungs clear to auscultation CV: regular rate and rhythm Abdomen: soft and non-tender  Impression & Plan: Assessment: 1.  CATARACT EXTRACTION STATUS; Right Eye (Z98.41) 2.  INTRAOCULAR LENS IOL (Z96.1) 3.  COMBINED FORMS  AGE RELATED CATARACT; Left Eye (H25.812) 4.  Presbyopia (H52.4)  Plan: 1.  1 week after cataract surgery. Doing well with improved vision and normal eye pressure. Call with any problems or concerns. Stop Vigamox. Continue Ilevro 1 drop 1x/day for 3 more weeks. Continue Pred Acetate 1 drop 2x/day for 3 more weeks. 2.  Doing well since surgery Continue Post-op medications  3.  Cataract accounts for the patient's decreased vision. This visual impairment is not correctable with a tolerable change in glasses or contact lenses. Cataract surgery with an implantation of a new lens should significantly improve the visual and functional status of the patient. Discussed all risks, benefits, alternatives, and potential complications. Discussed the procedures and recovery. Patient desires to have surgery. A-scan ordered and performed today for intra-ocular lens calculations. The surgery will be performed in order to improve vision for driving, reading, and for eye examinations. Recommend phacoemulsification with intra-ocular lens. Recommend Dextenza for post-operative pain and inflammation. Left Eye. Surgery required to correct imbalance of vision. Dilates well - shugarcaine by protocol.

## 2020-12-26 ENCOUNTER — Encounter (HOSPITAL_COMMUNITY): Payer: Self-pay

## 2020-12-26 ENCOUNTER — Other Ambulatory Visit: Payer: Self-pay

## 2020-12-26 NOTE — Pre-Procedure Instructions (Signed)
Spoke with Caryl Asp, RN at [facility] and went over preop information. She had no questions about any of the preop information.

## 2020-12-28 ENCOUNTER — Other Ambulatory Visit (HOSPITAL_COMMUNITY): Payer: PRIVATE HEALTH INSURANCE

## 2020-12-31 ENCOUNTER — Encounter (HOSPITAL_COMMUNITY)
Admission: RE | Disposition: A | Discharge status: Home / Self Care | Payer: Self-pay | Source: Home / Self Care | Attending: Ophthalmology

## 2020-12-31 ENCOUNTER — Other Ambulatory Visit (HOSPITAL_COMMUNITY)
Admit: 2020-12-31 | Discharge: 2020-12-31 | Disposition: A | Discharge status: Home / Self Care | Payer: Medicare HMO | Source: Home / Self Care | Attending: Ophthalmology | Admitting: Ophthalmology

## 2020-12-31 ENCOUNTER — Encounter (HOSPITAL_COMMUNITY): Payer: Self-pay | Admitting: Ophthalmology

## 2020-12-31 ENCOUNTER — Ambulatory Visit (HOSPITAL_COMMUNITY): Payer: Medicare HMO | Admitting: Anesthesiology

## 2020-12-31 ENCOUNTER — Other Ambulatory Visit: Payer: Self-pay

## 2020-12-31 ENCOUNTER — Ambulatory Visit (HOSPITAL_COMMUNITY)
Admission: RE | Admit: 2020-12-31 | Discharge: 2020-12-31 | Disposition: A | Discharge status: Home / Self Care | Payer: Medicare HMO | Attending: Ophthalmology | Admitting: Ophthalmology

## 2020-12-31 DIAGNOSIS — Z9841 Cataract extraction status, right eye: Secondary | ICD-10-CM | POA: Diagnosis not present

## 2020-12-31 DIAGNOSIS — Z79899 Other long term (current) drug therapy: Secondary | ICD-10-CM | POA: Insufficient documentation

## 2020-12-31 DIAGNOSIS — H25812 Combined forms of age-related cataract, left eye: Secondary | ICD-10-CM | POA: Diagnosis not present

## 2020-12-31 DIAGNOSIS — Z87891 Personal history of nicotine dependence: Secondary | ICD-10-CM | POA: Insufficient documentation

## 2020-12-31 DIAGNOSIS — H524 Presbyopia: Secondary | ICD-10-CM | POA: Insufficient documentation

## 2020-12-31 DIAGNOSIS — Z961 Presence of intraocular lens: Secondary | ICD-10-CM | POA: Insufficient documentation

## 2020-12-31 DIAGNOSIS — G20 Parkinson's disease: Secondary | ICD-10-CM | POA: Insufficient documentation

## 2020-12-31 DIAGNOSIS — J45909 Unspecified asthma, uncomplicated: Secondary | ICD-10-CM | POA: Insufficient documentation

## 2020-12-31 DIAGNOSIS — Z01812 Encounter for preprocedural laboratory examination: Secondary | ICD-10-CM | POA: Insufficient documentation

## 2020-12-31 DIAGNOSIS — I10 Essential (primary) hypertension: Secondary | ICD-10-CM | POA: Insufficient documentation

## 2020-12-31 DIAGNOSIS — Z20822 Contact with and (suspected) exposure to covid-19: Secondary | ICD-10-CM | POA: Insufficient documentation

## 2020-12-31 HISTORY — PX: CATARACT EXTRACTION W/PHACO: SHX586

## 2020-12-31 LAB — SARS CORONAVIRUS 2 BY RT PCR (HOSPITAL ORDER, PERFORMED IN ~~LOC~~ HOSPITAL LAB): SARS Coronavirus 2: NEGATIVE

## 2020-12-31 SURGERY — PHACOEMULSIFICATION, CATARACT, WITH IOL INSERTION
Anesthesia: Monitor Anesthesia Care | Site: Eye | Laterality: Left

## 2020-12-31 MED ORDER — PHENYLEPHRINE HCL 2.5 % OP SOLN
1.0000 [drp] | OPHTHALMIC | Status: AC | PRN
  Administered 2020-12-31 (×3): 1 [drp] via OPHTHALMIC

## 2020-12-31 MED ORDER — SODIUM HYALURONATE 10 MG/ML IO SOLUTION
PREFILLED_SYRINGE | INTRAOCULAR | Status: DC | PRN
  Administered 2020-12-31: 0.85 mL via INTRAOCULAR

## 2020-12-31 MED ORDER — LIDOCAINE HCL 3.5 % OP GEL
1.0000 "application " | Freq: Once | OPHTHALMIC | Status: AC
  Administered 2020-12-31: 1 via OPHTHALMIC

## 2020-12-31 MED ORDER — EPINEPHRINE PF 1 MG/ML IJ SOLN
INTRAMUSCULAR | Status: AC
  Filled 2020-12-31: qty 2

## 2020-12-31 MED ORDER — LIDOCAINE HCL (PF) 1 % IJ SOLN
INTRAOCULAR | Status: DC | PRN
  Administered 2020-12-31: 1 mL via OPHTHALMIC

## 2020-12-31 MED ORDER — BSS IO SOLN
INTRAOCULAR | Status: DC | PRN
  Administered 2020-12-31: 15 mL via INTRAOCULAR

## 2020-12-31 MED ORDER — NEOMYCIN-POLYMYXIN-DEXAMETH 3.5-10000-0.1 OP SUSP
OPHTHALMIC | Status: DC | PRN
  Administered 2020-12-31: 1 [drp] via OPHTHALMIC

## 2020-12-31 MED ORDER — STERILE WATER FOR IRRIGATION IR SOLN
Status: DC | PRN
  Administered 2020-12-31: 250 mL

## 2020-12-31 MED ORDER — TETRACAINE HCL 0.5 % OP SOLN
1.0000 [drp] | OPHTHALMIC | Status: AC | PRN
  Administered 2020-12-31 (×3): 1 [drp] via OPHTHALMIC

## 2020-12-31 MED ORDER — EPINEPHRINE PF 1 MG/ML IJ SOLN
INTRAOCULAR | Status: DC | PRN
  Administered 2020-12-31: 500 mL

## 2020-12-31 MED ORDER — SODIUM HYALURONATE 23MG/ML IO SOSY
PREFILLED_SYRINGE | INTRAOCULAR | Status: DC | PRN
  Administered 2020-12-31: 0.6 mL via INTRAOCULAR

## 2020-12-31 MED ORDER — POVIDONE-IODINE 5 % OP SOLN
OPHTHALMIC | Status: DC | PRN
  Administered 2020-12-31: 1 via OPHTHALMIC

## 2020-12-31 MED ORDER — TROPICAMIDE 1 % OP SOLN
1.0000 [drp] | OPHTHALMIC | Status: AC
  Administered 2020-12-31 (×3): 1 [drp] via OPHTHALMIC

## 2020-12-31 SURGICAL SUPPLY — 12 items
CLOTH BEACON ORANGE TIMEOUT ST (SAFETY) ×1 IMPLANT
EYE SHIELD UNIVERSAL CLEAR (GAUZE/BANDAGES/DRESSINGS) ×1 IMPLANT
GLOVE SURG UNDER POLY LF SZ6.5 (GLOVE) ×1 IMPLANT
GLOVE SURG UNDER POLY LF SZ7 (GLOVE) ×1 IMPLANT
NDL HYPO 18GX1.5 BLUNT FILL (NEEDLE) IMPLANT
NEEDLE HYPO 18GX1.5 BLUNT FILL (NEEDLE) ×2 IMPLANT
PAD ARMBOARD 7.5X6 YLW CONV (MISCELLANEOUS) ×1 IMPLANT
SYR TB 1ML LL NO SAFETY (SYRINGE) ×1 IMPLANT
TAPE SURG TRANSPORE 1 IN (GAUZE/BANDAGES/DRESSINGS) IMPLANT
TAPE SURGICAL TRANSPORE 1 IN (GAUZE/BANDAGES/DRESSINGS) ×2
TECNIS 1-PIECE IOL (Intraocular Lens) ×1 IMPLANT
WATER STERILE IRR 250ML POUR (IV SOLUTION) ×1 IMPLANT

## 2020-12-31 NOTE — Discharge Instructions (Signed)
Please discharge patient when stable, will follow up today with Dr. Carrol Hougland at the [facility] [place] office immediately following discharge.  Leave shield in place until visit.  All paperwork with discharge instructions will be given at the office.  [facility] [place] Address:  [address]  [place], [place] [address]  

## 2020-12-31 NOTE — Anesthesia Preprocedure Evaluation (Signed)
Anesthesia Evaluation  Patient identified by MRN, date of birth, ID band Patient awake    Reviewed: Allergy & Precautions, NPO status , Patient's Chart, lab work & pertinent test results  History of Anesthesia Complications (+) history of anesthetic complications (gait disturbance after prostatectomy )  Airway Mallampati: III  TM Distance: >3 FB Neck ROM: Full    Dental  (+) Dental Advisory Given   Pulmonary asthma , former smoker,    Pulmonary exam normal breath sounds clear to auscultation       Cardiovascular Exercise Tolerance: Poor METS (gait disturbance): hypertension, Pt. on medications Normal cardiovascular exam+ Valvular Problems/Murmurs  Rhythm:Regular Rate:Normal     Neuro/Psych  Neuromuscular disease (parkinsonism) negative psych ROS   GI/Hepatic Neg liver ROS, PUD,   Endo/Other  negative endocrine ROS  Renal/GU negative Renal ROS     Musculoskeletal  (+) Arthritis ,   Abdominal   Peds  Hematology   Anesthesia Other Findings Vertigo   Reproductive/Obstetrics                           Anesthesia Physical Anesthesia Plan  ASA: III  Anesthesia Plan: MAC   Post-op Pain Management:    Induction:   PONV Risk Score and Plan:   Airway Management Planned: Nasal Cannula and Natural Airway  Additional Equipment:   Intra-op Plan:   Post-operative Plan:   Informed Consent: I have reviewed the patients History and Physical, chart, labs and discussed the procedure including the risks, benefits and alternatives for the proposed anesthesia with the patient or authorized representative who has indicated his/her understanding and acceptance.     Dental advisory given  Plan Discussed with: CRNA and Surgeon  Anesthesia Plan Comments:        Anesthesia Quick Evaluation  

## 2020-12-31 NOTE — Interval H&P Note (Signed)
History and Physical Interval Note:  12/31/2020 12:04 PM  [name]  has presented today for surgery, with the diagnosis of Nuclear sclerotic cataract - Left eye.  The various methods of treatment have been discussed with the patient and family. After consideration of risks, benefits and other options for treatment, the patient has consented to  Procedure(s) with comments: CATARACT EXTRACTION PHACO AND INTRAOCULAR LENS PLACEMENT ([place]) (Left) - facility pt, needs rapid test as a surgical intervention.  The patient's history has been reviewed, patient examined, no change in status, stable for surgery.  I have reviewed the patient's chart and labs.  Questions were answered to the patient's satisfaction.     Baruch Goldmann

## 2020-12-31 NOTE — Op Note (Signed)
Date of procedure: 12/31/20  Pre-operative diagnosis: Visually significant age-related combined cataract, Left Eye (H25.812)  Post-operative diagnosis: Visually significant age-related combined cataract, Left Eye (H25.812)  Procedure: Removal of cataract via phacoemulsification and insertion of intra-ocular lens Wynetta Emery and Stafford  +19.5D into the capsular bag of the Left Eye  Attending surgeon: Gerda Diss. Naketa Daddario, MD, MA  Anesthesia: MAC, Topical Akten  Complications: None  Estimated Blood Loss: <3m (minimal)  Specimens: None  Implants: As above  Indications:  Visually significant age-related cataract, Left Eye  Procedure:  The patient was seen and identified in the pre-operative area. The operative eye was identified and dilated.  The operative eye was marked.  Topical anesthesia was administered to the operative eye.     The patient was then to the operative suite and placed in the supine position.  A timeout was performed confirming the patient, procedure to be performed, and all other relevant information.   The patient's face was prepped and draped in the usual fashion for intra-ocular surgery.  A lid speculum was placed into the operative eye and the surgical microscope moved into place and focused.  An inferotemporal paracentesis was created using a 20 gauge paracentesis blade.  Shugarcaine was injected into the anterior chamber.  Viscoelastic was injected into the anterior chamber.  A temporal clear-corneal main wound incision was created using a 2.469mmicrokeratome.  A continuous curvilinear capsulorrhexis was initiated using an irrigating cystitome and completed using capsulorrhexis forceps.  Hydrodissection and hydrodeliniation were performed.  Viscoelastic was injected into the anterior chamber.  A phacoemulsification handpiece and a chopper as a second instrument were used to remove the nucleus and epinucleus. The irrigation/aspiration handpiece was used to remove  any remaining cortical material.   The capsular bag was reinflated with viscoelastic, checked, and found to be intact.  The intraocular lens was inserted into the capsular bag.  The irrigation/aspiration handpiece was used to remove any remaining viscoelastic.  The clear corneal wound and paracentesis wounds were then hydrated and checked with Weck-Cels to be watertight.  The lid-speculum was removed.  The drape was removed.  The patient's face was cleaned with a wet and dry 4x4.   Maxitrol was instilled in the eye. A clear shield was taped over the eye. The patient was taken to the post-operative care unit in good condition, having tolerated the procedure well.  Post-Op Instructions: [facility] and will receive all other orders and instructions.

## 2020-12-31 NOTE — Anesthesia Postprocedure Evaluation (Signed)
Anesthesia Post Note  Patient: [name]  Procedure(s) Performed: CATARACT EXTRACTION PHACO AND INTRAOCULAR LENS PLACEMENT (IOC) (Left Eye)  Patient location during evaluation: Phase II Anesthesia Type: MAC Level of consciousness: awake and alert and oriented Pain management: pain level controlled Vital Signs Assessment: post-procedure vital signs reviewed and stable Respiratory status: spontaneous breathing and respiratory function stable Cardiovascular status: blood pressure returned to baseline and stable Postop Assessment: no apparent nausea or vomiting Anesthetic complications: no   No complications documented.   Last Vitals:  Vitals:   12/31/20 1137 12/31/20 1237  BP: (!) 146/102 133/83  Pulse: 69 72  Resp: 18 (!) 21  Temp: 36.7 C 36.9 C  SpO2: 96% 99%    Last Pain:  Vitals:   12/31/20 1237  TempSrc: Axillary  PainSc: 0-No pain                 Quyen Cutsforth C Eugean Arnott

## 2020-12-31 NOTE — Transfer of Care (Signed)
Immediate Anesthesia Transfer of Care Note  Patient: [name]  Procedure(s) Performed: CATARACT EXTRACTION PHACO AND INTRAOCULAR LENS PLACEMENT (IOC) (Left Eye)  Patient Location: PACU and Short Stay  Anesthesia Type:MAC  Level of Consciousness: awake, alert  and oriented  Airway & Oxygen Therapy: Patient Spontanous Breathing  Post-op Assessment: Report given to RN and Post -op Vital signs reviewed and stable  Post vital signs: Reviewed and stable  Last Vitals:  Vitals Value Taken Time  BP    Temp    Pulse    Resp    SpO2      Last Pain:  Vitals:   12/31/20 1137  TempSrc: Oral  PainSc: 0-No pain         Complications: No complications documented.

## 2021-01-01 ENCOUNTER — Encounter (HOSPITAL_COMMUNITY): Payer: Self-pay | Admitting: Ophthalmology

## 2021-01-02 DIAGNOSIS — I10 Essential (primary) hypertension: Secondary | ICD-10-CM | POA: Diagnosis not present

## 2021-01-05 DIAGNOSIS — E785 Hyperlipidemia, unspecified: Secondary | ICD-10-CM | POA: Diagnosis not present

## 2021-01-05 DIAGNOSIS — I160 Hypertensive urgency: Secondary | ICD-10-CM | POA: Diagnosis not present

## 2021-01-05 DIAGNOSIS — I10 Essential (primary) hypertension: Secondary | ICD-10-CM | POA: Diagnosis not present

## 2021-01-17 DIAGNOSIS — R42 Dizziness and giddiness: Secondary | ICD-10-CM | POA: Diagnosis not present

## 2021-01-17 DIAGNOSIS — H04123 Dry eye syndrome of bilateral lacrimal glands: Secondary | ICD-10-CM | POA: Diagnosis not present

## 2021-01-17 DIAGNOSIS — E785 Hyperlipidemia, unspecified: Secondary | ICD-10-CM | POA: Diagnosis not present

## 2021-01-17 DIAGNOSIS — K219 Gastro-esophageal reflux disease without esophagitis: Secondary | ICD-10-CM | POA: Diagnosis not present

## 2021-01-17 DIAGNOSIS — N3281 Overactive bladder: Secondary | ICD-10-CM | POA: Diagnosis not present

## 2021-01-17 DIAGNOSIS — R011 Cardiac murmur, unspecified: Secondary | ICD-10-CM | POA: Diagnosis not present

## 2021-01-17 DIAGNOSIS — F32A Depression, unspecified: Secondary | ICD-10-CM | POA: Diagnosis not present

## 2021-01-17 DIAGNOSIS — I10 Essential (primary) hypertension: Secondary | ICD-10-CM | POA: Diagnosis not present

## 2021-01-21 DIAGNOSIS — N3281 Overactive bladder: Secondary | ICD-10-CM | POA: Diagnosis not present

## 2021-01-21 DIAGNOSIS — F32A Depression, unspecified: Secondary | ICD-10-CM | POA: Diagnosis not present

## 2021-01-21 DIAGNOSIS — I10 Essential (primary) hypertension: Secondary | ICD-10-CM | POA: Diagnosis not present

## 2021-01-21 DIAGNOSIS — E785 Hyperlipidemia, unspecified: Secondary | ICD-10-CM | POA: Diagnosis not present

## 2021-01-21 DIAGNOSIS — H04123 Dry eye syndrome of bilateral lacrimal glands: Secondary | ICD-10-CM | POA: Diagnosis not present

## 2021-01-21 DIAGNOSIS — R011 Cardiac murmur, unspecified: Secondary | ICD-10-CM | POA: Diagnosis not present

## 2021-01-21 DIAGNOSIS — R42 Dizziness and giddiness: Secondary | ICD-10-CM | POA: Diagnosis not present

## 2021-01-21 DIAGNOSIS — K219 Gastro-esophageal reflux disease without esophagitis: Secondary | ICD-10-CM | POA: Diagnosis not present

## 2021-01-24 DIAGNOSIS — I160 Hypertensive urgency: Secondary | ICD-10-CM | POA: Diagnosis not present

## 2021-01-24 DIAGNOSIS — E785 Hyperlipidemia, unspecified: Secondary | ICD-10-CM | POA: Diagnosis not present

## 2021-01-24 DIAGNOSIS — I10 Essential (primary) hypertension: Secondary | ICD-10-CM | POA: Diagnosis not present

## 2021-04-27 ENCOUNTER — Observation Stay (HOSPITAL_COMMUNITY)
Admission: EM | Admit: 2021-04-27 | Discharge: 2021-05-01 | Disposition: A | Discharge status: Home / Self Care | Payer: Medicare HMO | Attending: Internal Medicine | Admitting: Internal Medicine

## 2021-04-27 ENCOUNTER — Other Ambulatory Visit: Payer: Self-pay

## 2021-04-27 DIAGNOSIS — Z20822 Contact with and (suspected) exposure to covid-19: Secondary | ICD-10-CM | POA: Insufficient documentation

## 2021-04-27 DIAGNOSIS — F0280 Dementia in other diseases classified elsewhere without behavioral disturbance: Secondary | ICD-10-CM | POA: Diagnosis present

## 2021-04-27 DIAGNOSIS — E785 Hyperlipidemia, unspecified: Secondary | ICD-10-CM | POA: Diagnosis present

## 2021-04-27 DIAGNOSIS — Z85828 Personal history of other malignant neoplasm of skin: Secondary | ICD-10-CM | POA: Diagnosis not present

## 2021-04-27 DIAGNOSIS — R112 Nausea with vomiting, unspecified: Secondary | ICD-10-CM

## 2021-04-27 DIAGNOSIS — Z87891 Personal history of nicotine dependence: Secondary | ICD-10-CM | POA: Diagnosis not present

## 2021-04-27 DIAGNOSIS — Z79899 Other long term (current) drug therapy: Secondary | ICD-10-CM | POA: Insufficient documentation

## 2021-04-27 DIAGNOSIS — G20 Parkinson's disease: Secondary | ICD-10-CM | POA: Diagnosis present

## 2021-04-27 DIAGNOSIS — K567 Ileus, unspecified: Secondary | ICD-10-CM | POA: Diagnosis not present

## 2021-04-27 DIAGNOSIS — K5641 Fecal impaction: Secondary | ICD-10-CM | POA: Diagnosis not present

## 2021-04-27 DIAGNOSIS — F0390 Unspecified dementia without behavioral disturbance: Secondary | ICD-10-CM | POA: Diagnosis not present

## 2021-04-27 DIAGNOSIS — J45909 Unspecified asthma, uncomplicated: Secondary | ICD-10-CM | POA: Insufficient documentation

## 2021-04-27 DIAGNOSIS — K5900 Constipation, unspecified: Secondary | ICD-10-CM | POA: Diagnosis present

## 2021-04-27 DIAGNOSIS — E441 Mild protein-calorie malnutrition: Secondary | ICD-10-CM | POA: Diagnosis present

## 2021-04-27 DIAGNOSIS — E876 Hypokalemia: Secondary | ICD-10-CM | POA: Diagnosis not present

## 2021-04-27 DIAGNOSIS — K760 Fatty (change of) liver, not elsewhere classified: Secondary | ICD-10-CM | POA: Diagnosis present

## 2021-04-27 DIAGNOSIS — K5981 Ogilvie syndrome: Secondary | ICD-10-CM | POA: Insufficient documentation

## 2021-04-27 DIAGNOSIS — I10 Essential (primary) hypertension: Secondary | ICD-10-CM | POA: Diagnosis present

## 2021-04-27 NOTE — ED Triage Notes (Signed)
[facility] from [facility]. Complaining of 2 days of N/V, abdominal distention. 1 episode of dark brown vomiting (not coffee-ground).

## 2021-04-28 ENCOUNTER — Emergency Department (HOSPITAL_COMMUNITY): Payer: Medicare HMO

## 2021-04-28 ENCOUNTER — Encounter (HOSPITAL_COMMUNITY): Payer: Self-pay | Admitting: Internal Medicine

## 2021-04-28 DIAGNOSIS — K567 Ileus, unspecified: Secondary | ICD-10-CM

## 2021-04-28 DIAGNOSIS — E876 Hypokalemia: Secondary | ICD-10-CM | POA: Diagnosis present

## 2021-04-28 DIAGNOSIS — F0280 Dementia in other diseases classified elsewhere without behavioral disturbance: Secondary | ICD-10-CM | POA: Diagnosis present

## 2021-04-28 DIAGNOSIS — K760 Fatty (change of) liver, not elsewhere classified: Secondary | ICD-10-CM

## 2021-04-28 DIAGNOSIS — K5900 Constipation, unspecified: Secondary | ICD-10-CM | POA: Diagnosis present

## 2021-04-28 DIAGNOSIS — E441 Mild protein-calorie malnutrition: Secondary | ICD-10-CM | POA: Diagnosis present

## 2021-04-28 DIAGNOSIS — G20 Parkinson's disease: Secondary | ICD-10-CM | POA: Diagnosis present

## 2021-04-28 HISTORY — DX: Fatty (change of) liver, not elsewhere classified: K76.0

## 2021-04-28 LAB — COMPREHENSIVE METABOLIC PANEL
ALT: 18 U/L (ref 0–44)
AST: 23 U/L (ref 15–41)
Albumin: 3.2 g/dL — ABNORMAL LOW (ref 3.5–5.0)
Alkaline Phosphatase: 74 U/L (ref 38–126)
Anion gap: 12 (ref 5–15)
BUN: 20 mg/dL (ref 8–23)
CO2: 22 mmol/L (ref 22–32)
Calcium: 8.5 mg/dL — ABNORMAL LOW (ref 8.9–10.3)
Chloride: 102 mmol/L (ref 98–111)
Creatinine, Ser: 0.67 mg/dL (ref 0.61–1.24)
GFR, Estimated: >60 mL/min (ref >60)
Glucose, Bld: 125 mg/dL — ABNORMAL HIGH (ref 70–99)
Potassium: 3.1 mmol/L — ABNORMAL LOW (ref 3.5–5.1)
Sodium: 136 mmol/L (ref 135–145)
Total Bilirubin: 0.9 mg/dL (ref 0.3–1.2)
Total Protein: 6.3 g/dL — ABNORMAL LOW (ref 6.5–8.1)

## 2021-04-28 LAB — CBC WITH DIFFERENTIAL/PLATELET
Abs Immature Granulocytes: 0.04 10*3/uL (ref 0.00–0.07)
Basophils Absolute: 0 10*3/uL (ref 0.0–0.1)
Basophils Relative: 0 %
Eosinophils Absolute: 0 10*3/uL (ref 0.0–0.5)
Eosinophils Relative: 0 %
HCT: 44.8 % (ref 39.0–52.0)
Hemoglobin: 14.9 g/dL (ref 13.0–17.0)
Immature Granulocytes: 1 %
Lymphocytes Relative: 13 %
Lymphs Abs: 1 10*3/uL (ref 0.7–4.0)
MCH: 30.8 pg (ref 26.0–34.0)
MCHC: 33.3 g/dL (ref 30.0–36.0)
MCV: 92.6 fL (ref 80.0–100.0)
Monocytes Absolute: 0.6 10*3/uL (ref 0.1–1.0)
Monocytes Relative: 7 %
Neutro Abs: 6 10*3/uL (ref 1.7–7.7)
Neutrophils Relative %: 79 %
Platelets: 184 10*3/uL (ref 150–400)
RBC: 4.84 MIL/uL (ref 4.22–5.81)
RDW: 13.4 % (ref 11.5–15.5)
WBC: 7.6 10*3/uL (ref 4.0–10.5)
nRBC: 0 % (ref 0.0–0.2)

## 2021-04-28 LAB — RESP PANEL BY RT-PCR (FLU A&B, COVID) ARPGX2
Influenza A by PCR: NEGATIVE
Influenza B by PCR: NEGATIVE
SARS Coronavirus 2 by RT PCR: NEGATIVE

## 2021-04-28 MED ORDER — ONDANSETRON HCL 4 MG/2ML IJ SOLN
4.0000 mg | Freq: Once | INTRAMUSCULAR | Status: AC
  Administered 2021-04-28: 4 mg via INTRAVENOUS
  Filled 2021-04-28: qty 2

## 2021-04-28 MED ORDER — SERTRALINE HCL 25 MG PO TABS
25.0000 mg | ORAL_TABLET | Freq: Every day | ORAL | Status: DC
  Administered 2021-04-28 – 2021-04-30 (×3): 25 mg via ORAL
  Filled 2021-04-28 (×3): qty 1

## 2021-04-28 MED ORDER — MAGNESIUM SULFATE 2 GM/50ML IV SOLN
2.0000 g | Freq: Once | INTRAVENOUS | Status: AC
  Administered 2021-04-28: 2 g via INTRAVENOUS
  Filled 2021-04-28: qty 50

## 2021-04-28 MED ORDER — ENOXAPARIN SODIUM 40 MG/0.4ML IJ SOSY
40.0000 mg | PREFILLED_SYRINGE | INTRAMUSCULAR | Status: DC
  Administered 2021-04-28 – 2021-05-01 (×4): 40 mg via SUBCUTANEOUS
  Filled 2021-04-28 (×4): qty 0.4

## 2021-04-28 MED ORDER — MILK AND MOLASSES ENEMA
1.0000 | Freq: Once | RECTAL | Status: AC
  Administered 2021-04-28: 240 mL via RECTAL
  Filled 2021-04-28 (×2): qty 240

## 2021-04-28 MED ORDER — LACTULOSE 10 GM/15ML PO SOLN
20.0000 g | Freq: Two times a day (BID) | ORAL | Status: DC
  Administered 2021-04-28 – 2021-04-30 (×4): 20 g via ORAL
  Filled 2021-04-28 (×4): qty 30

## 2021-04-28 MED ORDER — ACETAMINOPHEN 325 MG PO TABS: 650.0000 mg | ORAL_TABLET | Freq: Four times a day (QID) | ORAL | Status: DC | PRN

## 2021-04-28 MED ORDER — PANTOPRAZOLE SODIUM 40 MG IV SOLR
40.0000 mg | INTRAVENOUS | Status: DC
  Administered 2021-04-28 – 2021-05-01 (×4): 40 mg via INTRAVENOUS
  Filled 2021-04-28 (×4): qty 40

## 2021-04-28 MED ORDER — POTASSIUM CHLORIDE IN NACL 20-0.9 MEQ/L-% IV SOLN: INTRAVENOUS | Status: DC

## 2021-04-28 MED ORDER — POTASSIUM CHLORIDE IN NACL 40-0.9 MEQ/L-% IV SOLN
INTRAVENOUS | Status: DC
  Filled 2021-04-28: qty 1000

## 2021-04-28 MED ORDER — BISACODYL 10 MG RE SUPP
10.0000 mg | Freq: Once | RECTAL | Status: AC
  Administered 2021-04-28: 10 mg via RECTAL
  Filled 2021-04-28: qty 1

## 2021-04-28 MED ORDER — ACETAMINOPHEN 650 MG RE SUPP: 650.0000 mg | Freq: Four times a day (QID) | RECTAL | Status: DC | PRN

## 2021-04-28 MED ORDER — METOPROLOL TARTRATE 5 MG/5ML IV SOLN: 2.5000 mg | Freq: Four times a day (QID) | INTRAVENOUS | Status: DC | PRN

## 2021-04-28 MED ORDER — ENALAPRILAT 1.25 MG/ML IV SOLN
0.6250 mg | Freq: Three times a day (TID) | INTRAVENOUS | Status: DC
  Filled 2021-04-28 (×2): qty 0.5

## 2021-04-28 MED ORDER — METOPROLOL TARTRATE 5 MG/5ML IV SOLN: 2.5000 mg | Freq: Two times a day (BID) | INTRAVENOUS | Status: DC

## 2021-04-28 MED ORDER — ONDANSETRON HCL 4 MG/2ML IJ SOLN: 4.0000 mg | Freq: Four times a day (QID) | INTRAMUSCULAR | Status: DC | PRN

## 2021-04-28 MED ORDER — POTASSIUM CHLORIDE IN NACL 40-0.9 MEQ/L-% IV SOLN
INTRAVENOUS | Status: DC
  Filled 2021-04-28 (×2): qty 1000

## 2021-04-28 MED ORDER — CARBIDOPA-LEVODOPA 25-100 MG PO TABS
1.0000 | ORAL_TABLET | Freq: Three times a day (TID) | ORAL | Status: DC
  Administered 2021-04-28 – 2021-05-01 (×9): 1 via ORAL
  Filled 2021-04-28 (×10): qty 1

## 2021-04-28 MED ORDER — LISINOPRIL 5 MG PO TABS
2.5000 mg | ORAL_TABLET | Freq: Every day | ORAL | Status: DC
  Administered 2021-04-29 – 2021-05-01 (×3): 2.5 mg via ORAL
  Filled 2021-04-28 (×3): qty 1

## 2021-04-28 MED ORDER — MESALAMINE 1.2 G PO TBEC
1.2000 g | DELAYED_RELEASE_TABLET | Freq: Every day | ORAL | Status: DC
  Administered 2021-04-29 – 2021-05-01 (×3): 1.2 g via ORAL
  Filled 2021-04-28 (×3): qty 1

## 2021-04-28 MED ORDER — ONDANSETRON HCL 4 MG PO TABS: 4.0000 mg | ORAL_TABLET | Freq: Four times a day (QID) | ORAL | Status: DC | PRN

## 2021-04-28 NOTE — Progress Notes (Addendum)
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 -In the ED labs noted mild hypokalemia, labs otherwise unremarkable CT abdomen pelvis noted dilated gas-filled colon with large amount of stool in the rectum suggesting pseudoobstruction or ileus, he was given a milk of molasses enema in the ED and admitted  Assessment & Plan:   Nausea and vomiting Constipation, pseudo-obstruction/Ileus -CT without IV contrast noted dilated gas-filled colon with large amount of stool in the rectum -Had a small BM after milk of molasses enema -Add Dulcolax suppository, lactulose twice daily -Clear liquids, IV fluids today -Labs in a.m.  Parkinson's disease Dementia -At risk of delirium  Atelectasis -Add incentive spirometry, reportedly bedbound at baseline  Moderate protein calorie malnutrition -Add supplements when p.o. intake improves  DVT prophylaxis: Lovenox Code Status: DNR Family Communication: No family at bedside, unable to reach Wyman Songster listed on contact list Disposition Plan:  Status is: Observation  The patient will require care spanning > 2 midnights and should be moved to inpatient because: IV treatments appropriate due to intensity of illness or inability to take PO  Dispo: The patient is from: ALF              Anticipated d/c is to: ALF              Patient currently is not medically stable to d/c.   Difficult to place patient No  Consultants:    Procedures:   Antimicrobials:    Subjective: -Had a small BM after enema, vomited last night, none so far this morning,  sleeping  Objective: Vitals:   04/28/21 0530 04/28/21 0730 04/28/21 0838 04/28/21 0930  BP: (!) 145/91 123/74 120/65 118/62  Pulse: 78 70 66 72  Resp: 20 (!) '23 17 19  '$ Temp:      TempSrc:      SpO2: 96% 98% 99% 99%   No intake or output data in the 24 hours ending 04/28/21 0952 There were no vitals filed for this visit.  Examination:  General exam: Somnolent, easily arousable, oriented to self only  CVS: S1-S2, regular rate rhythm Lungs: Decreased breath sounds to bases otherwise clear Abdomen: Soft, mildly distended, nontender, bowel sounds present Extremities: No edema Skin: No rashes on exposed skin Psych: Poor insight and judgment   Data Reviewed:   CBC: Recent Labs  Lab 04/28/21 0058  WBC 7.6  NEUTROABS 6.0  HGB 14.9  HCT 44.8  MCV 92.6  PLT [id number]   Basic Metabolic Panel: Recent Labs  Lab 04/28/21 0058  NA 136  K 3.1*  CL 102  CO2 22  GLUCOSE 125*  BUN 20  CREATININE 0.67  CALCIUM 8.5*   GFR: CrCl cannot be calculated (Unknown ideal weight.). Liver Function Tests: Recent Labs  Lab 04/28/21 0058  AST 23  ALT 18  ALKPHOS 74  BILITOT 0.9  PROT 6.3*  ALBUMIN 3.2*   No results for input(s): LIPASE, AMYLASE in the last 168 hours. No results for input(s): AMMONIA in the last 168 hours. Coagulation Profile: No results for input(s): INR, PROTIME in the last 168 hours. Cardiac Enzymes: No results for input(s): CKTOTAL, CKMB, CKMBINDEX, TROPONINI in the last 168 hours.  BNP (last 3 results) No results for input(s): PROBNP in the last 8760 hours. HbA1C: No results for input(s): HGBA1C in the last 72 hours. CBG: No results for input(s): GLUCAP in the last 168 hours. Lipid Profile: No results for input(s): CHOL, HDL, LDLCALC, TRIG, CHOLHDL, LDLDIRECT in the last 72 hours. Thyroid Function Tests: No results for input(s): TSH, T4TOTAL, FREET4, T3FREE, THYROIDAB in the last 72 hours. Anemia Panel: No results for input(s): VITAMINB12, FOLATE, FERRITIN,  TIBC, IRON, RETICCTPCT in the last 72 hours. Urine analysis:    Component Value Date/Time   COLORURINE YELLOW 02/25/2013 1405   APPEARANCEUR CLEAR 02/25/2013 1405   LABSPEC 1.012 02/25/2013 1405   PHURINE 6.5 02/25/2013 1405   GLUCOSEU NEGATIVE 02/25/2013 1405   HGBUR NEGATIVE 02/25/2013 1405   BILIRUBINUR NEGATIVE 02/25/2013 1405   KETONESUR NEGATIVE 02/25/2013 1405   PROTEINUR NEGATIVE 02/25/2013 1405   UROBILINOGEN 0.2 02/25/2013 1405   NITRITE NEGATIVE 02/25/2013 1405   LEUKOCYTESUR NEGATIVE 02/25/2013 1405   Sepsis Labs: '@LABRCNTIP'$ (procalcitonin:4,lacticidven:4)  ) Recent Results (from the past 240 hour(s))  Resp Panel by RT-PCR (Flu A&B, Covid) Nasopharyngeal Swab     Status: None   Collection Time: 04/28/21  6:23 AM   Specimen: Nasopharyngeal Swab; Nasopharyngeal(NP) swabs in vial transport medium  Result Value Ref Range Status   SARS Coronavirus 2 by RT PCR NEGATIVE NEGATIVE Final    Comment: (NOTE) SARS-CoV-2 target nucleic acids are NOT DETECTED.  The SARS-CoV-2 RNA is generally detectable in upper respiratory specimens during the acute phase of infection. The lowest concentration of SARS-CoV-2 viral copies this assay can detect is 138 copies/mL. A negative result does not preclude SARS-Cov-2 infection and should not be used as the sole basis for treatment or other patient management decisions. A negative result may occur with  improper specimen collection/handling, submission of specimen other than nasopharyngeal swab, presence of viral mutation(s) within the areas targeted by this assay, and inadequate number of viral copies(<138 copies/mL). A negative result must be combined with clinical observations, patient history, and epidemiological information. The expected result is Negative.  Fact Sheet for Patients:  EntrepreneurPulse.com.au  Fact Sheet for Healthcare Providers:  IncredibleEmployment.be  This test is no t yet  approved or cleared by the [place] FDA and  has been authorized for detection and/or diagnosis of SARS-CoV-2 by FDA under an Emergency Use Authorization (EUA). This EUA will remain  in effect (meaning this test can be used) for the duration of the COVID-19 declaration under Section 564(b)(1) of the Act, 21 U.S.C.section 360bbb-3(b)(1), unless the authorization is terminated  or revoked sooner.       Influenza A by PCR NEGATIVE NEGATIVE Final   Influenza B by PCR NEGATIVE NEGATIVE Final    Comment: (NOTE) The Xpert Xpress SARS-CoV-2/FLU/RSV plus assay is intended as an aid in the diagnosis of influenza from Nasopharyngeal swab specimens and should not be used as a sole basis for treatment. Nasal washings and aspirates are unacceptable for Xpert Xpress SARS-CoV-2/FLU/RSV testing.  Fact Sheet for Patients: EntrepreneurPulse.com.au  Fact Sheet for Healthcare Providers: IncredibleEmployment.be  This test is not yet approved or cleared by the [place] FDA and has been authorized for detection and/or diagnosis of SARS-CoV-2 by FDA under an Emergency Use Authorization (EUA). This EUA will remain in effect (meaning this test can be used) for the duration of the COVID-19 declaration under Section 564(b)(1) of the Act, 21 U.S.C. section 360bbb-3(b)(1), unless the authorization is terminated or revoked.  Performed at [facility],  [place] [address] [place], [place] [address]      Radiology Studies: CT ABDOMEN PELVIS WO CONTRAST  Result Date: 04/28/2021 CLINICAL DATA:  Bowel obstruction suspected. Two day history of nausea, vomiting, and abdominal distention. EXAM: CT ABDOMEN AND PELVIS WITHOUT CONTRAST TECHNIQUE: Multidetector CT imaging of the abdomen and pelvis was performed following the standard protocol without IV contrast. COMPARISON:  03/14/2015 FINDINGS: Lower chest: Infiltration or atelectasis in both lung bases,  possibly pneumonia. Hepatobiliary: Mild diffuse fatty infiltration of the liver. Gallbladder and bile ducts are unremarkable. Pancreas: Unremarkable. No pancreatic ductal dilatation or surrounding inflammatory changes. Spleen: Normal in size without focal abnormality. Adrenals/Urinary Tract: No adrenal gland nodules. Multiple cysts throughout both kidneys, some with mural calcification. Appearance consistent with polycystic kidneys. No hydronephrosis or hydroureter. No stones identified. Bladder is unremarkable. Stomach/Bowel: Stomach is not abnormally distended. Small bowel are mostly decompressed. There is a very large amount of stool in the rectum. The proximal colon is dilated and gas filled. Changes may indicate colonic ileus or pseudo-obstruction due to the rectal stool. Mild infiltration in the perirectal fat may indicate stercoral colitis. Appendix is normal. Vascular/Lymphatic: Aortic atherosclerosis. No enlarged abdominal or pelvic lymph nodes. Reproductive: Prostate gland is surgically absent. Other: No free air or free fluid in the abdomen. Abdominal wall musculature appears intact. Musculoskeletal: Degenerative changes in the spine. Lumbar scoliosis convex towards the left. No evidence of bone metastasis. IMPRESSION: 1. Dilated gas-filled colon with large amount of stool in the rectum suggesting either pseudo-obstruction or ileus. Perirectal soft tissue infiltration may indicate stercoral colitis. 2. Infiltration or atelectasis in both lung bases may indicate pneumonia. 3. Fatty infiltration of the liver. 4. Polycystic kidneys. 5. Aortic atherosclerosis. Electronically Signed   By: Lucienne Capers M.D.   On: 04/28/2021 01:33        Scheduled Meds:  bisacodyl  10 mg Rectal Once   enoxaparin (LOVENOX) injection  40 mg Subcutaneous Q24H   lactulose  20 g Oral BID   pantoprazole (PROTONIX) IV  40 mg Intravenous Q24H   Continuous Infusions:  0.9 % NaCl with KCl 40 mEq / L       LOS: 0 days     Time spent: 58mn   PDomenic Polite MD [facility]   04/28/2021, 9:52 AM

## 2021-04-28 NOTE — ED Notes (Signed)
Purple DNR sticker placed on patient armband.

## 2021-04-28 NOTE — H&P (Signed)
History and Physical    [name] [id number] DOB: 08-12-1940 DOA: 04/27/2021  PCP: Dione Housekeeper, MD   Patient coming from: [facility] (SNF).  I have personally briefly reviewed patient's old medical records in [place]  Chief Complaint: Vomiting.  HPI: [name] is a 80 y.o. male with medical history significant of osteoarthritis of the left knee, asthma, facial basal cell CA, BPPV, cataracts, Parkinson's disease, Parkinson's dementia, colon polyps, hyperlipidemia, oral mucosa leukoplakia, obstructive hydrocephalus, GERD, ulcerative colitis, essential hypertension who is coming to the emergency department from [facility] due to abdominal distention, nausea and vomiting of bilious contents.  He is unable to provide further information at this time.  ED Course: Initial vital signs were temperature 99.1 F, pulse 87, respirations 22, BP 154/98 mmHg O2 sat 96% on room air.  The patient received ondansetron 4 mg IVP and then milk of molasses enema with partial manual disimpaction.  Lab work: CBC was normal.  CMP showed a potassium of 3.1 mmol/L.  Glucose 125 mg/dL.  Total protein 6.3 and albumin 3.2 g/dL.  The rest of the CMP results are normal when calcium is corrected to albumin.  Imaging: CT abdomen/pelvis without contrast showed dilated gas-filled colon with large amount of stool in the rectum suggesting either pseudoobstruction or ileus.  Perirectal soft tissue infiltration may be due to a stercoral colitis.  There is infiltration or rectal ectasis in both lung bases which may indicate pneumonia.  There is fat infiltration of the liver.  Polycystic kidneys.  Aortic atherosclerosis.  Review of Systems: As per HPI otherwise all other systems reviewed and are negative.  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" (02/25/2013); concerns re: general anesthesia b/c he didn't devleop gait disturbance until the post-operative period following prostectomy in 2009   History of blood transfusion    no abnormal reaction noted   History of colon polyps    Hyperlipidemia    is on med but unsure of name-to bring day of surgery   Joint swelling    left knee   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   "in the back of my mouth" (02/25/2013)   Obstructive hydrocephalus (HCC)    Reflux    takes Protonix daily   [place]    takes Apriso daily   Unspecified essential hypertension     Past Surgical History:  Procedure Laterality Date   BASAL CELL CARCINOMA EXCISION Left 2000's   points to nasal fold  (02/25/2013)   CATARACT EXTRACTION W/PHACO Right 12/17/2020   Procedure: CATARACT EXTRACTION PHACO AND INTRAOCULAR LENS PLACEMENT RIGHT EYE;  Surgeon: WBaruch Goldmann MD;  Location: [facility];  Service: Ophthalmology;  Laterality: Right;  right CDE=10.46   CATARACT EXTRACTION W/PHACO Left 12/31/2020   Procedure: CATARACT EXTRACTION PHACO AND INTRAOCULAR LENS PLACEMENT (IOC);  Surgeon: WBaruch Goldmann MD;  Location: [facility];  Service: Ophthalmology;  Laterality: Left;  CDE   12.84   COLONOSCOPY     MELANOMA EXCISION WITH SENTINEL LYMPH NODE BIOPSY Left 10/25/2013   Procedure: WIDE EXCISION MELANOMA LEFT  ARM WITH LEFT AXILLARY  SENTINEL LYMPH NODE BIOPSY;  Surgeon: Joyice Faster. Cornett, MD;  Location: [place];  Service: General;  Laterality: Left;   SUPRAPUBIC PROSTATECTOMY  02/21/2008   BPH    Social History  reports that he has quit smoking. His smoking use included pipe. He has never used smokeless tobacco. He reports current alcohol use. He reports that he does not use drugs.  Allergies  Allergen Reactions   Lexapro  [Escitalopram Oxalate] Other (See Comments)    Unknown: reaction occurred some time ago and pt cannot remember what it was   Tetracyclines & Related Other (See Comments)    Unknown: Reaction occurred some time ago and pt cannot remember what it was     Family History  Problem Relation Age of Onset   Heart failure Father    CAD Father 38       Died age 10   Dementia Mother    Heart attack Paternal Uncle    Prior to Admission medications   Medication Sig Start Date End Date Taking? Authorizing Provider  acetaminophen (TYLENOL) 500 MG tablet Take 500 mg by mouth every 6 (six) hours as needed for moderate pain.    [provider]  carbidopa-levodopa (SINEMET IR) 25-100 MG tablet TAKE  (1)  TABLET  THREE TIMES DAILY. Patient taking differently: Take 1 tablet by mouth in the morning. 0800 03/18/16   Star Age, MD  hydroxypropyl methylcellulose / hypromellose (ISOPTO TEARS / GONIOVISC) 2.5 % ophthalmic solution Place 2 drops into both eyes daily. 0900    [provider]  lisinopril (PRINIVIL,ZESTRIL) 2.5 MG tablet Take 2.5 mg by mouth in the morning. 0800 02/25/16   [provider]  meclizine (ANTIVERT) 25 MG tablet Take 25 mg by mouth 3 (three) times daily as needed for dizziness or nausea.    [provider]  mesalamine (LIALDA) 1.2 G EC tablet Take 1.2 g by mouth in the morning.    [provider]  pantoprazole (PROTONIX) 40 MG tablet Take 40 mg by mouth at bedtime. 2000    [provider]  pravastatin (PRAVACHOL) 20 MG tablet Take 20 mg by mouth at bedtime. 2000    [provider]  sertraline (ZOLOFT) 25 MG tablet Take 25 mg by mouth at bedtime. 2000 04/11/14   [provider]  tolterodine (DETROL LA) 4 MG 24 hr capsule Take 4 mg by mouth in the morning. 0800    [provider]   Physical Exam: Vitals:   04/28/21 0330 04/28/21 0500 04/28/21 0515 04/28/21 0530  BP: (!) 164/91 123/78  (!) 145/91  Pulse: 80 85   78  Resp: (!) '21 19  20  '$ Temp:      TempSrc:      SpO2: 94% 92% 92% 96%   Constitutional: Chronically ill-appearing.  NAD, calm, comfortable Eyes: PERRL, lids and conjunctivae normal ENMT: Mucous membranes are dry.  Posterior pharynx clear of any exudate or lesions. Neck: normal, supple, no masses, no thyromegaly Respiratory: Decreased breath sounds in bases, otherwise clear to auscultation bilaterally, no wheezing, no crackles. Normal respiratory effort. No accessory muscle use.  Cardiovascular: Regular rate and rhythm, no murmurs / rubs / gallops. No extremity edema. 2+ pedal pulses. No carotid bruits.  Abdomen: Positive distention.  Bowel sounds positive.  Mild LLQ tenderness, no guarding or rebound, no masses palpated. No hepatosplenomegaly. Bowel sounds positive.  Musculoskeletal: no clubbing / cyanosis. Good ROM, no contractures. Normal muscle tone.  Skin: Small areas  of ecchymosis on extremities.  Unable to evaluate sacral area. Neurologic: Grossly nonfocal.  Unable to fully evaluate. Psychiatric: Somnolent.  Labs on Admission: I have personally reviewed following labs and imaging studies  CBC: Recent Labs  Lab 04/28/21 0058  WBC 7.6  NEUTROABS 6.0  HGB 14.9  HCT 44.8  MCV 92.6  PLT [id number]   Basic Metabolic Panel: Recent Labs  Lab 04/28/21 0058  NA 136  K 3.1*  CL 102  CO2 22  GLUCOSE 125*  BUN 20  CREATININE 0.67  CALCIUM 8.5*   GFR: CrCl cannot be calculated (Unknown ideal weight.).  Liver Function Tests: Recent Labs  Lab 04/28/21 0058  AST 23  ALT 18  ALKPHOS 74  BILITOT 0.9  PROT 6.3*  ALBUMIN 3.2*   Radiological Exams on Admission: CT ABDOMEN PELVIS WO CONTRAST  Result Date: 04/28/2021 CLINICAL DATA:  Bowel obstruction suspected. Two day history of nausea, vomiting, and abdominal distention. EXAM: CT ABDOMEN AND PELVIS WITHOUT CONTRAST TECHNIQUE: Multidetector CT imaging of the abdomen and pelvis was performed following the standard protocol  without IV contrast. COMPARISON:  03/14/2015 FINDINGS: Lower chest: Infiltration or atelectasis in both lung bases, possibly pneumonia. Hepatobiliary: Mild diffuse fatty infiltration of the liver. Gallbladder and bile ducts are unremarkable. Pancreas: Unremarkable. No pancreatic ductal dilatation or surrounding inflammatory changes. Spleen: Normal in size without focal abnormality. Adrenals/Urinary Tract: No adrenal gland nodules. Multiple cysts throughout both kidneys, some with mural calcification. Appearance consistent with polycystic kidneys. No hydronephrosis or hydroureter. No stones identified. Bladder is unremarkable. Stomach/Bowel: Stomach is not abnormally distended. Small bowel are mostly decompressed. There is a very large amount of stool in the rectum. The proximal colon is dilated and gas filled. Changes may indicate colonic ileus or pseudo-obstruction due to the rectal stool. Mild infiltration in the perirectal fat may indicate stercoral colitis. Appendix is normal. Vascular/Lymphatic: Aortic atherosclerosis. No enlarged abdominal or pelvic lymph nodes. Reproductive: Prostate gland is surgically absent. Other: No free air or free fluid in the abdomen. Abdominal wall musculature appears intact. Musculoskeletal: Degenerative changes in the spine. Lumbar scoliosis convex towards the left. No evidence of bone metastasis. IMPRESSION: 1. Dilated gas-filled colon with large amount of stool in the rectum suggesting either pseudo-obstruction or ileus. Perirectal soft tissue infiltration may indicate stercoral colitis. 2. Infiltration or atelectasis in both lung bases may indicate pneumonia. 3. Fatty infiltration of the liver. 4. Polycystic kidneys. 5. Aortic atherosclerosis. Electronically Signed   By: Lucienne Capers M.D.   On: 04/28/2021 01:33    EKG: Independently reviewed.   Assessment/Plan Principal Problem:   Ileus ([place]) With multiple episodes of N/V In the setting of   Constipation with rectal  fecal impaction Has had partial improvement in the ED. Observation/telemetry. Antiemetics as needed. Continue IV fluids. Protonix 40 mg IVP Q 24 hr.  Optimize electrolytes. Discontinue Detrol LA. Place NGT if emesis recurs. Follow-up CBC and BMP. Consider surgical evaluation.  Active Problems:   Essential hypertension Currently on lisinopril. Parenteral enalapril while NPO. Metoprolol 2.5 mg IV Q6 hr PRN. Monitor blood pressure and heart rate.    Hyperlipidemia Hold pravastatin for now    Parkinsonism ([place]) Resume Sinemet once tolerating oral intake..    Parkinson's disease dementia ([place]) Supportive care. Continue Sinemet once back on diet.    Hypokalemia Replacing through IV fluids. Supplemented magnesium. Follow-up potassium level.    Mild protein malnutrition (HCC) Protein supplementation once eating. Consider nutritional services evaluation.     DVT prophylaxis: Lovenox SQ. Code Status:  DNR. Family Communication:   Disposition Plan:   Patient is from:  SNF [facility])  Anticipated DC to:  SNF ([place])  Anticipated DC date:  04/29/2021.  Anticipated DC barriers: Clinical status.  Consults called:   Admission status:  Observation/telemetry.  Severity of Illness:  High severity after presenting with abdominal distention in the setting of ileus secondary to constipation.  The patient will remain in the hospital for IV hydration, electrolyte replacement and further bowel management.  Reubin Milan MD [facility]  How to contact the [facility] Attending or Consulting provider [place] or covering provider during after hours [place], for this patient?   Check the care team in [facility] and look for a) attending/consulting [facility] provider listed and b) the [facility] team listed Log into [email or web address] and use [place]'s universal password to access. If you do not have the password, please contact the hospital operator. Locate the [facility] provider  you are looking for under [facility] and page to a number that you can be directly reached. If you still have difficulty reaching the provider, please page the [facility] (Director on Call) for the Hospitalists listed on amion for assistance.  04/28/2021, 6:12 AM   This document was prepared using Dragon voice recognition software and may contain some unintended transcription errors.

## 2021-04-28 NOTE — Progress Notes (Signed)
Jeannette Corpus APP paged regarding admission assessment finding.   "New ED admit. Left foot cooler in compare to right. unable to locate Pedal pulse; weak tibial pulse. Plz advice"  Care nurse and preceptor RN checked with doppler to locate pulse. Patient denies, numbness, tingling, or pain. Capillary refill sluggish but <3sec. Pt able to move toes on command. +2 pitting edema bilateral feet and ankles. R>L. Sensation to light touch intact.

## 2021-04-28 NOTE — ED Provider Notes (Signed)
[facility] DEPT Provider Note: Georgena Spurling, MD, FACEP  CSN: [id number] MRN: [id number] ARRIVAL: 04/27/21 at 2329 ROOM: WA20/WA20   CHIEF COMPLAINT  Vomiting  Level 5 caveat: Dementia HISTORY OF PRESENT ILLNESS  04/28/21 12:05 AM [name] is a 80 y.o. male with Parkinson's dementia.  He was sent from his living facility for 2 days of nausea and vomiting (dark brown but not coffee grounds; bilious?)  And abdominal distention and pain.  The pain is diffuse and the abdomen is tender.  The nursing facility reports absent bowel sounds.  The patient is not able to give me a history.   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" (02/25/2013); concerns re: general anesthesia b/c he didn't devleop gait disturbance until the post-operative period following prostectomy in 2009   History of blood transfusion    no abnormal reaction noted   History of colon polyps    Hyperlipidemia    is on med but unsure of name-to bring day of surgery   Joint swelling    left knee   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   "in the back of my mouth" (02/25/2013)   Obstructive hydrocephalus (HCC)    Reflux    takes Protonix daily   Ulcerative colitis ([facility]    takes Apriso daily   Unspecified essential hypertension     Past Surgical History:  Procedure Laterality Date   BASAL CELL CARCINOMA EXCISION Left 2000's   points to nasal fold  (02/25/2013)   CATARACT EXTRACTION W/PHACO Right 12/17/2020   Procedure: CATARACT EXTRACTION PHACO AND INTRAOCULAR LENS PLACEMENT RIGHT EYE;  Surgeon: WBaruch Goldmann MD;  Location: [facility];  Service: Ophthalmology;  Laterality: Right;   right CDE=10.46   CATARACT EXTRACTION W/PHACO Left 12/31/2020   Procedure: CATARACT EXTRACTION PHACO AND INTRAOCULAR LENS PLACEMENT (IOC);  Surgeon: WBaruch Goldmann MD;  Location: [facility];  Service: Ophthalmology;  Laterality: Left;  CDE   12.84   COLONOSCOPY     MELANOMA EXCISION WITH SENTINEL LYMPH NODE BIOPSY Left 10/25/2013   Procedure: WIDE EXCISION MELANOMA LEFT ARM WITH LEFT AXILLARY  SENTINEL LYMPH NODE BIOPSY;  Surgeon: TJoyice Faster Cornett, MD;  Location: [facility]  Service: General;  Laterality: Left;   SUPRAPUBIC PROSTATECTOMY  02/21/2008   BPH    Family History  Problem Relation Age of Onset   Heart failure Father    CAD Father 635      Died age 79  Dementia Mother    Heart attack Paternal Uncle     Social History   Tobacco Use   Smoking status: Former    Types: Pipe   Smokeless tobacco: Never   Tobacco comments:    02/25/2013 "quit smoking > 20 yr ago"  Substance Use Topics   Alcohol use: Yes    Alcohol/week: 0.0 standard drinks    Comment: glass of wine occasionally   Drug use: No    Prior to Admission medications   Medication Sig Start Date End Date Taking? Authorizing Provider  acetaminophen (TYLENOL) 500 MG tablet Take 500 mg  by mouth every 6 (six) hours as needed for moderate pain.    [provider]  carbidopa-levodopa (SINEMET IR) 25-100 MG tablet TAKE  (1)  TABLET  THREE TIMES DAILY. Patient taking differently: Take 1 tablet by mouth in the morning. 0800 03/18/16   Star Age, MD  hydroxypropyl methylcellulose / hypromellose (ISOPTO TEARS / GONIOVISC) 2.5 % ophthalmic solution Place 2 drops into both eyes daily. 0900    [provider]  lisinopril (PRINIVIL,ZESTRIL) 2.5 MG tablet Take 2.5 mg by mouth in the morning. 0800 02/25/16   [provider]  meclizine (ANTIVERT) 25 MG tablet Take 25 mg by mouth 3 (three) times daily as needed for dizziness or nausea.    [provider]  mesalamine (LIALDA) 1.2 G EC tablet Take 1.2 g by  mouth in the morning.    [provider]  pantoprazole (PROTONIX) 40 MG tablet Take 40 mg by mouth at bedtime. 2000    [provider]  pravastatin (PRAVACHOL) 20 MG tablet Take 20 mg by mouth at bedtime. 2000    [provider]  sertraline (ZOLOFT) 25 MG tablet Take 25 mg by mouth at bedtime. 2000 04/11/14   [provider]  tolterodine (DETROL LA) 4 MG 24 hr capsule Take 4 mg by mouth in the morning. 0800    [provider]    Allergies Lexapro [escitalopram oxalate] and Tetracyclines & related   REVIEW OF SYSTEMS  Level 5 caveat   PHYSICAL EXAMINATION  Initial Vital Signs Blood pressure (!) 154/98, pulse 87, temperature 99.1 F (37.3 C), temperature source Oral, SpO2 96 %.  Examination General: Well-developed, well-nourished male in no acute distress; appearance consistent with age of record HENT: normocephalic; atraumatic Eyes: Right pupil round and reactive to light; left pupil round and fixed; bilateral pseudophakia Neck: supple Heart: regular rate and rhythm Lungs: clear to auscultation bilaterally; hiccups Abdomen: soft; distended; diffusely tender; high-pitched bowel sounds  Rectal: Large quantity of soft stool in rectal vault with extrusion through the anus Extremities: No deformity; trace edema of lower legs; pulses +2 in the upper extremities, +1 in the lower extremities Neurologic: Sleeping but arousable; noted to move all extremities  Skin: Warm and dry Psychiatric: Flat affect   RESULTS  Summary of this visit's results, reviewed and interpreted by myself:   EKG Interpretation  Date/Time:    Ventricular Rate:    PR Interval:    QRS Duration:   QT Interval:    QTC Calculation:   R Axis:     Text Interpretation:         Laboratory Studies: Results for orders placed or performed during the hospital encounter of 04/27/21 (from the past 24 hour(s))  CBC with Differential/Platelet     Status: None   Collection  Time: 04/28/21 12:58 AM  Result Value Ref Range   WBC 7.6 4.0 - 10.5 K/uL   RBC 4.84 4.22 - 5.81 MIL/uL   Hemoglobin 14.9 13.0 - 17.0 g/dL   HCT 44.8 39.0 - 52.0 %   MCV 92.6 80.0 - 100.0 fL   MCH 30.8 26.0 - 34.0 pg   MCHC 33.3 30.0 - 36.0 g/dL   RDW 13.4 11.5 - 15.5 %   Platelets 184 150 - 400 K/uL   nRBC 0.0 0.0 - 0.2 %   Neutrophils Relative % 79 %   Neutro Abs 6.0 1.7 - 7.7 K/uL   Lymphocytes Relative 13 %   Lymphs Abs 1.0 0.7 - 4.0 K/uL  Monocytes Relative 7 %   Monocytes Absolute 0.6 0.1 - 1.0 K/uL   Eosinophils Relative 0 %   Eosinophils Absolute 0.0 0.0 - 0.5 K/uL   Basophils Relative 0 %   Basophils Absolute 0.0 0.0 - 0.1 K/uL   Immature Granulocytes 1 %   Abs Immature Granulocytes 0.04 0.00 - 0.07 K/uL  Comprehensive metabolic panel     Status: Abnormal   Collection Time: 04/28/21 12:58 AM  Result Value Ref Range   Sodium 136 135 - 145 mmol/L   Potassium 3.1 (L) 3.5 - 5.1 mmol/L   Chloride 102 98 - 111 mmol/L   CO2 22 22 - 32 mmol/L   Glucose, Bld 125 (H) 70 - 99 mg/dL   BUN 20 8 - 23 mg/dL   Creatinine, Ser 0.67 0.61 - 1.24 mg/dL   Calcium 8.5 (L) 8.9 - 10.3 mg/dL   Total Protein 6.3 (L) 6.5 - 8.1 g/dL   Albumin 3.2 (L) 3.5 - 5.0 g/dL   AST 23 15 - 41 U/L   ALT 18 0 - 44 U/L   Alkaline Phosphatase 74 38 - 126 U/L   Total Bilirubin 0.9 0.3 - 1.2 mg/dL   GFR, Estimated >60 >60 mL/min   Anion gap 12 5 - 15   Imaging Studies: CT ABDOMEN PELVIS WO CONTRAST  Result Date: 04/28/2021 CLINICAL DATA:  Bowel obstruction suspected. Two day history of nausea, vomiting, and abdominal distention. EXAM: CT ABDOMEN AND PELVIS WITHOUT CONTRAST TECHNIQUE: Multidetector CT imaging of the abdomen and pelvis was performed following the standard protocol without IV contrast. COMPARISON:  03/14/2015 FINDINGS: Lower chest: Infiltration or atelectasis in both lung bases, possibly pneumonia. Hepatobiliary: Mild diffuse fatty infiltration of the liver. Gallbladder and bile ducts are  unremarkable. Pancreas: Unremarkable. No pancreatic ductal dilatation or surrounding inflammatory changes. Spleen: Normal in size without focal abnormality. Adrenals/Urinary Tract: No adrenal gland nodules. Multiple cysts throughout both kidneys, some with mural calcification. Appearance consistent with polycystic kidneys. No hydronephrosis or hydroureter. No stones identified. Bladder is unremarkable. Stomach/Bowel: Stomach is not abnormally distended. Small bowel are mostly decompressed. There is a very large amount of stool in the rectum. The proximal colon is dilated and gas filled. Changes may indicate colonic ileus or pseudo-obstruction due to the rectal stool. Mild infiltration in the perirectal fat may indicate stercoral colitis. Appendix is normal. Vascular/Lymphatic: Aortic atherosclerosis. No enlarged abdominal or pelvic lymph nodes. Reproductive: Prostate gland is surgically absent. Other: No free air or free fluid in the abdomen. Abdominal wall musculature appears intact. Musculoskeletal: Degenerative changes in the spine. Lumbar scoliosis convex towards the left. No evidence of bone metastasis. IMPRESSION: 1. Dilated gas-filled colon with large amount of stool in the rectum suggesting either pseudo-obstruction or ileus. Perirectal soft tissue infiltration may indicate stercoral colitis. 2. Infiltration or atelectasis in both lung bases may indicate pneumonia. 3. Fatty infiltration of the liver. 4. Polycystic kidneys. 5. Aortic atherosclerosis. Electronically Signed   By: Lucienne Capers M.D.   On: 04/28/2021 01:33    ED COURSE and MDM  Nursing notes, initial and subsequent vitals signs, including pulse oximetry, reviewed and interpreted by myself.  Vitals:   04/28/21 0330 04/28/21 0500 04/28/21 0515 04/28/21 0530  BP: (!) 164/91 123/78  (!) 145/91  Pulse: 80 85  78  Resp: (!) '21 19  20  '$ Temp:      TempSrc:      SpO2: 94% 92% 92% 96%   Medications  0.9 % NaCl with KCl 40 mEq /  L   infusion (has no administration in time range)  magnesium sulfate IVPB 2 g 50 mL (2 g Intravenous New Bag/Given 04/28/21 0622)  enoxaparin (LOVENOX) injection 40 mg (has no administration in time range)  acetaminophen (TYLENOL) tablet 650 mg (has no administration in time range)    Or  acetaminophen (TYLENOL) suppository 650 mg (has no administration in time range)  ondansetron (ZOFRAN) tablet 4 mg (has no administration in time range)    Or  ondansetron (ZOFRAN) injection 4 mg (has no administration in time range)  ondansetron (ZOFRAN) injection 4 mg (4 mg Intravenous Given 04/28/21 0050)  milk and molasses enema (240 mLs Rectal Given 04/28/21 0410)   2:55 AM The CT shows a large amount of stool in the rectum consistent with physical examination.  The stool is soft and not amenable to manual disimpaction.  We will try a milk of molasses enema to hopefully relieve the pseudoobstruction.  5:55 AM Patient's rectum significantly emptied with milk of molasses enema but abdomen still distended though no longer is tender.  We will have the patient admitted for further bowel care.   PROCEDURES  Procedures CRITICAL CARE Performed by: Karen Chafe Tem[place]wa [place] Total critical care time: 30 minutes Critical care time was exclusive of separately billable procedures and treating other patients. Critical care was necessary to treat or prevent imminent or life-threatening deterioration. Critical care was time spent personally by me on the following activities: development of treatment plan with patient and/or surrogate as well as nursing, discussions with consultants, evaluation of patient's response to treatment, examination of patient, obtaining history from patient or surrogate, ordering and performing treatments and interventions, ordering and review of laboratory studies, ordering and review of radiographic studies, pulse oximetry and re-evaluation of patient's condition.   ED DIAGNOSES     ICD-10-CM   1.  Pseudo-obstruction of colon  K59.81     2. Fecal impaction in rectum (HCC)  K56.41     3. Nausea and vomiting in adult  R11.2          Shanon Rosser, MD 04/28/21 [phone]

## 2021-04-29 DIAGNOSIS — K5900 Constipation, unspecified: Secondary | ICD-10-CM | POA: Diagnosis not present

## 2021-04-29 DIAGNOSIS — E876 Hypokalemia: Secondary | ICD-10-CM

## 2021-04-29 DIAGNOSIS — R112 Nausea with vomiting, unspecified: Secondary | ICD-10-CM

## 2021-04-29 DIAGNOSIS — G20 Parkinson's disease: Secondary | ICD-10-CM

## 2021-04-29 DIAGNOSIS — E441 Mild protein-calorie malnutrition: Secondary | ICD-10-CM

## 2021-04-29 DIAGNOSIS — K567 Ileus, unspecified: Secondary | ICD-10-CM | POA: Diagnosis not present

## 2021-04-29 DIAGNOSIS — I10 Essential (primary) hypertension: Secondary | ICD-10-CM | POA: Diagnosis not present

## 2021-04-29 LAB — CBC
HCT: 39.3 % (ref 39.0–52.0)
Hemoglobin: 12.9 g/dL — ABNORMAL LOW (ref 13.0–17.0)
MCH: 30.9 pg (ref 26.0–34.0)
MCHC: 32.8 g/dL (ref 30.0–36.0)
MCV: 94 fL (ref 80.0–100.0)
Platelets: 167 10*3/uL (ref 150–400)
RBC: 4.18 MIL/uL — ABNORMAL LOW (ref 4.22–5.81)
RDW: 13.4 % (ref 11.5–15.5)
WBC: 5.8 10*3/uL (ref 4.0–10.5)
nRBC: 0 % (ref 0.0–0.2)

## 2021-04-29 LAB — COMPREHENSIVE METABOLIC PANEL
ALT: 6 U/L (ref 0–44)
AST: 16 U/L (ref 15–41)
Albumin: 2.7 g/dL — ABNORMAL LOW (ref 3.5–5.0)
Alkaline Phosphatase: 50 U/L (ref 38–126)
Anion gap: 6 (ref 5–15)
BUN: 14 mg/dL (ref 8–23)
CO2: 25 mmol/L (ref 22–32)
Calcium: 8.5 mg/dL — ABNORMAL LOW (ref 8.9–10.3)
Chloride: 110 mmol/L (ref 98–111)
Creatinine, Ser: 0.63 mg/dL (ref 0.61–1.24)
GFR, Estimated: >60 mL/min (ref >60)
Glucose, Bld: 90 mg/dL (ref 70–99)
Potassium: 3.9 mmol/L (ref 3.5–5.1)
Sodium: 141 mmol/L (ref 135–145)
Total Bilirubin: 0.7 mg/dL (ref 0.3–1.2)
Total Protein: 5.3 g/dL — ABNORMAL LOW (ref 6.5–8.1)

## 2021-04-29 LAB — MAGNESIUM: Magnesium: 2 mg/dL (ref 1.7–2.4)

## 2021-04-29 LAB — MRSA NEXT GEN BY PCR, NASAL: MRSA by PCR Next Gen: NOT DETECTED

## 2021-04-29 MED ORDER — SODIUM CHLORIDE 0.9 % IV SOLN: INTRAVENOUS | Status: DC

## 2021-04-29 MED ORDER — BISACODYL 10 MG RE SUPP
10.0000 mg | Freq: Every day | RECTAL | Status: DC
  Filled 2021-04-29: qty 1

## 2021-04-29 MED ORDER — ENSURE ENLIVE PO LIQD
237.0000 mL | Freq: Two times a day (BID) | ORAL | Status: DC
  Administered 2021-04-30 – 2021-05-01 (×3): 237 mL via ORAL

## 2021-04-29 NOTE — Progress Notes (Signed)
Pt arrived to unit via stretcher, room 1514. Alert and oriented to self.Oriented to room/ callbell with no complications. No complaint of pain or nausea/ vomiting.  2 RN skin assessment completed. Initial assessment completed by primary RN. Will continue to monitor.

## 2021-04-29 NOTE — Plan of Care (Signed)
  Problem: Education: Goal: Knowledge of General Education information will improve Description: Including pain rating scale, medication(s)/side effects and non-pharmacologic comfort measures Outcome: Progressing   Problem: Nutrition: Goal: Adequate nutrition will be maintained Outcome: Progressing   

## 2021-04-29 NOTE — Progress Notes (Signed)
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 -In the ED labs noted mild hypokalemia, labs otherwise unremarkable CT abdomen pelvis noted dilated gas-filled colon with large amount of stool in the rectum suggesting pseudoobstruction or ileus, he was given a milk of molasses enema in the ED and admitted   Assessment & Plan:   Principal Problem:   Ileus ([place]) Active Problems:   Essential hypertension   Hyperlipidemia   Parkinsonism (HCC)   Hypokalemia   Mild protein malnutrition (HCC)   Parkinson's disease dementia ([place])   Constipation   NAFL (nonalcoholic fatty liver)  1 nausea/vomiting/constipation/pseudoobstruction/ileus -Patient presented with nausea vomiting abdominal distention and pain. -CT abdomen and pelvis with dilated gas-filled colon with large amount of stool in the rectum suggesting pseudoobstruction versus ileus. -Status post milk of molasses enema in the ED which resulted in small bowel movement. -Patient received Dulcolax suppository and lactulose twice daily with large bowel movements overnight and today per RN. -Tolerating clears. -Continue IV fluids. -Advance diet to a full liquid diet.  2.  Parkinson's disease/dementia -Continue Sinemet.  3.  Hypertension -Continue lisinopril. -IV Lopressor as needed  4.  Atelectasis -Patient noted to be bedbound at baseline. -Incentive spirometry.  5.  Moderate protein calorie malnutrition -Currently on clears will advance to full liquid diet. -Placed on  Ensure supplements.  6.  Hypokalemia -Magnesium at 2.0. -Potassium at 3.9. -Change IV fluids to normal saline and remove KCl from IV fluids.   DVT prophylaxis: Lovenox Code Status: DNR Family Communication: Updated patient.  No family at bedside. Disposition:   Status is: Observation  The patient remains OBS appropriate and will d/c before 2 midnights.  Dispo: The patient is from: Home              Anticipated d/c is to: Home              Patient currently is not medically stable to d/c.   Difficult to place patient No       Consultants:  None  Procedures:  CT abdomen and pelvis 04/28/2021  Antimicrobials:  None   Subjective: Patient laying in bed.  Denies any significant nausea or emesis.  He states he is tolerating clears.  Not sure as to whether he had bowel movement last night.  However per RN patient with 2 large bowel movements overnight.  No chest pain.  No abdominal pain.  Objective: Vitals:   04/29/21 0012 04/29/21 0412 04/29/21 0825 04/29/21 0830  BP: 124/81 140/80 137/68 137/68  Pulse: 63 (!) 58  (!) 59  Resp: '18 18  18  '$ Temp: 97.6 F (36.4 C) (!) 97.4 F (36.3 C)  97.6 F (36.4 C)  TempSrc: Oral   Oral  SpO2: 96% 97%  99%  Weight:      Height:        Intake/Output Summary (Last 24 hours) at 04/29/2021 1312 Last data filed at 04/29/2021 0533 Gross per 24 hour  Intake 1563.75 ml  Output 301 ml  Net 1262.75 ml   Filed Weights   04/28/21 1051  Weight: 78 kg  Examination:  General exam: Appears calm and comfortable  Respiratory system: Clear to auscultation. Respiratory effort normal. Cardiovascular system: S1 & S2 heard, RRR. No JVD, murmurs, rubs, gallops or clicks. No pedal edema. Gastrointestinal system: Abdomen is soft, mildly distended, nontender to palpation, positive bowel sounds.  No rebound.  No guarding. Central nervous system: Alert and oriented. No focal neurological deficits. Extremities: Symmetric 5 x 5 power. Skin: No  rashes, lesions or ulcers Psychiatry: Judgement and insight appear normal. Mood & affect appropriate.     Data Reviewed: I have personally reviewed following labs and imaging studies  CBC: Recent Labs  Lab 04/28/21 0058 04/29/21 0432  WBC 7.6 5.8  NEUTROABS 6.0  --   HGB 14.9 12.9*  HCT 44.8 39.3  MCV 92.6 94.0  PLT 184 [id number]    Basic Metabolic Panel: Recent Labs  Lab 04/28/21 0058 04/29/21 0432  NA 136 141  K 3.1* 3.9  CL 102 110  CO2 22 25  GLUCOSE 125* 90  BUN 20 14  CREATININE 0.67 0.63  CALCIUM 8.5* 8.5*  MG  --  2.0    GFR: Estimated Creatinine Clearance: 74.9 mL/min (by C-G formula based on SCr of 0.63 mg/dL).  Liver Function Tests: Recent Labs  Lab 04/28/21 0058 04/29/21 0432  AST 23 16  ALT 18 6  ALKPHOS 74 50  BILITOT 0.9 0.7  PROT 6.3* 5.3*  ALBUMIN 3.2* 2.7*    CBG: No results for input(s): GLUCAP in the last 168 hours.   Recent Results (from the past 240 hour(s))  Resp Panel by RT-PCR (Flu A&B, Covid) Nasopharyngeal Swab     Status: None   Collection Time: 04/28/21  6:23 AM   Specimen: Nasopharyngeal Swab; Nasopharyngeal(NP) swabs in vial transport medium  Result Value Ref Range Status   SARS Coronavirus 2 by RT PCR NEGATIVE NEGATIVE Final    Comment: (NOTE) SARS-CoV-2 target nucleic acids are NOT DETECTED.  The SARS-CoV-2 RNA is generally detectable in upper respiratory specimens during the acute phase of infection. The lowest concentration of SARS-CoV-2 viral copies this assay can detect is 138 copies/mL. A negative result does not preclude SARS-Cov-2 infection and should not be used as the sole basis for treatment or other patient management decisions. A negative result may occur with  improper specimen collection/handling, submission of specimen other than nasopharyngeal swab, presence of viral mutation(s) within the areas targeted by this assay, and inadequate number of viral copies(<138 copies/mL). A negative result must be  combined with clinical observations, patient history, and epidemiological information. The expected result is Negative.  Fact Sheet for Patients:  EntrepreneurPulse.com.au  Fact Sheet for Healthcare Providers:  IncredibleEmployment.be  This test is no t yet approved or cleared by the [place] FDA and  has been authorized for detection and/or diagnosis of SARS-CoV-2 by FDA under an Emergency Use Authorization (EUA). This EUA will remain  in effect (meaning this test can be used) for the duration of the COVID-19 declaration under Section 564(b)(1) of the Act, 21 U.S.C.section 360bbb-3(b)(1), unless the authorization is terminated  or revoked sooner.       Influenza A by PCR NEGATIVE NEGATIVE Final   Influenza B by PCR NEGATIVE NEGATIVE Final    Comment: (NOTE) The Xpert Xpress SARS-CoV-2/FLU/RSV plus assay is intended as an aid in the diagnosis of influenza from Nasopharyngeal swab specimens and should not be used as a sole basis for treatment. Nasal washings and aspirates are unacceptable for Xpert Xpress SARS-CoV-2/FLU/RSV testing.  Fact Sheet for  Patients: EntrepreneurPulse.com.au  Fact Sheet for Healthcare Providers: IncredibleEmployment.be  This test is not yet approved or cleared by the [place] FDA and has been authorized for detection and/or diagnosis of SARS-CoV-2 by FDA under an Emergency Use Authorization (EUA). This EUA will remain in effect (meaning this test can be used) for the duration of the COVID-19 declaration under Section 564(b)(1) of the Act, 21 U.S.C. section 360bbb-3(b)(1), unless the authorization is terminated or revoked.  Performed at [facility], [place] [address], [place], [place] [address]   MRSA Next Gen by PCR, Nasal     Status: None   Collection Time: 04/29/21  9:00 AM   Specimen: Nasal Mucosa; Nasal Swab  Result Value Ref Range Status    MRSA by PCR Next Gen NOT DETECTED NOT DETECTED Final    Comment: (NOTE) The GeneXpert MRSA Assay (FDA approved for NASAL specimens only), is one component of a comprehensive MRSA colonization surveillance program. It is not intended to diagnose MRSA infection nor to guide or monitor treatment for MRSA infections. Test performance is not FDA approved in patients less than 38 years old. Performed at [facility], [place] [address] [place], [place] [address]          Radiology Studies: CT ABDOMEN PELVIS WO CONTRAST  Result Date: 04/28/2021 CLINICAL DATA:  Bowel obstruction suspected. Two day history of nausea, vomiting, and abdominal distention. EXAM: CT ABDOMEN AND PELVIS WITHOUT CONTRAST TECHNIQUE: Multidetector CT imaging of the abdomen and pelvis was performed following the standard protocol without IV contrast. COMPARISON:  03/14/2015 FINDINGS: Lower chest: Infiltration or atelectasis in both lung bases, possibly pneumonia. Hepatobiliary: Mild diffuse fatty infiltration of the liver. Gallbladder and bile ducts are unremarkable. Pancreas: Unremarkable. No pancreatic ductal dilatation or surrounding inflammatory changes. Spleen: Normal in size without focal abnormality. Adrenals/Urinary Tract: No adrenal gland nodules. Multiple cysts throughout both kidneys, some with mural calcification. Appearance consistent with polycystic kidneys. No hydronephrosis or hydroureter. No stones identified. Bladder is unremarkable. Stomach/Bowel: Stomach is not abnormally distended. Small bowel are mostly decompressed. There is a very large amount of stool in the rectum. The proximal colon is dilated and gas filled. Changes may indicate colonic ileus or pseudo-obstruction due to the rectal stool. Mild infiltration in the perirectal fat may indicate stercoral colitis. Appendix is normal. Vascular/Lymphatic: Aortic atherosclerosis. No enlarged abdominal or pelvic lymph nodes. Reproductive: Prostate  gland is surgically absent. Other: No free air or free fluid in the abdomen. Abdominal wall musculature appears intact. Musculoskeletal: Degenerative changes in the spine. Lumbar scoliosis convex towards the left. No evidence of bone metastasis. IMPRESSION: 1. Dilated gas-filled colon with large amount of stool in the rectum suggesting either pseudo-obstruction or ileus. Perirectal soft tissue infiltration may indicate stercoral colitis. 2. Infiltration or atelectasis in both lung bases may indicate pneumonia. 3. Fatty infiltration of the liver. 4. Polycystic kidneys. 5. Aortic atherosclerosis. Electronically Signed   By: Lucienne Capers M.D.   On: 04/28/2021 01:33        Scheduled Meds:  carbidopa-levodopa  1 tablet Oral TID   enoxaparin (LOVENOX) injection  40 mg Subcutaneous Q24H   lactulose  20 g Oral BID   lisinopril  2.5 mg Oral Daily   mesalamine  1.2 g Oral Q breakfast   pantoprazole (PROTONIX) IV  40 mg Intravenous Q24H   sertraline  25 mg Oral QHS   Continuous Infusions:   LOS: 0 days    Time spent: 35 minutes    Irine Seal,  MD [facility]   To contact the attending provider between 7A-7P or the covering provider during after hours 7P-7A, please log into the web site [email or web address] and access using universal  password for that web site. If you do not have the password, please call the hospital operator.  04/29/2021, 1:12 PM

## 2021-04-30 DIAGNOSIS — K5900 Constipation, unspecified: Secondary | ICD-10-CM | POA: Diagnosis not present

## 2021-04-30 DIAGNOSIS — K5641 Fecal impaction: Secondary | ICD-10-CM

## 2021-04-30 DIAGNOSIS — I10 Essential (primary) hypertension: Secondary | ICD-10-CM | POA: Diagnosis not present

## 2021-04-30 DIAGNOSIS — K567 Ileus, unspecified: Secondary | ICD-10-CM | POA: Diagnosis not present

## 2021-04-30 DIAGNOSIS — K5981 Ogilvie syndrome: Secondary | ICD-10-CM | POA: Diagnosis not present

## 2021-04-30 DIAGNOSIS — E785 Hyperlipidemia, unspecified: Secondary | ICD-10-CM

## 2021-04-30 DIAGNOSIS — K760 Fatty (change of) liver, not elsewhere classified: Secondary | ICD-10-CM

## 2021-04-30 DIAGNOSIS — F0280 Dementia in other diseases classified elsewhere without behavioral disturbance: Secondary | ICD-10-CM

## 2021-04-30 LAB — MAGNESIUM: Magnesium: 1.9 mg/dL (ref 1.7–2.4)

## 2021-04-30 LAB — CBC
HCT: 42.6 % (ref 39.0–52.0)
Hemoglobin: 14.3 g/dL (ref 13.0–17.0)
MCH: 30.6 pg (ref 26.0–34.0)
MCHC: 33.6 g/dL (ref 30.0–36.0)
MCV: 91.2 fL (ref 80.0–100.0)
Platelets: 182 10*3/uL (ref 150–400)
RBC: 4.67 MIL/uL (ref 4.22–5.81)
RDW: 12.9 % (ref 11.5–15.5)
WBC: 6.4 10*3/uL (ref 4.0–10.5)
nRBC: 0 % (ref 0.0–0.2)

## 2021-04-30 LAB — BASIC METABOLIC PANEL
Anion gap: 7 (ref 5–15)
BUN: 9 mg/dL (ref 8–23)
CO2: 25 mmol/L (ref 22–32)
Calcium: 8.5 mg/dL — ABNORMAL LOW (ref 8.9–10.3)
Chloride: 100 mmol/L (ref 98–111)
Creatinine, Ser: 0.5 mg/dL — ABNORMAL LOW (ref 0.61–1.24)
GFR, Estimated: >60 mL/min (ref >60)
Glucose, Bld: 84 mg/dL (ref 70–99)
Potassium: 4 mmol/L (ref 3.5–5.1)
Sodium: 132 mmol/L — ABNORMAL LOW (ref 135–145)

## 2021-04-30 MED ORDER — ENSURE ENLIVE PO LIQD: 237.0000 mL | Freq: Two times a day (BID) | ORAL | 12 refills | Status: DC

## 2021-04-30 MED ORDER — LACTULOSE 10 GM/15ML PO SOLN: 20.0000 g | Freq: Two times a day (BID) | ORAL | 0 refills | Status: DC

## 2021-04-30 NOTE — NC FL2 (Signed)
[place] LEVEL OF CARE SCREENING TOOL     IDENTIFICATION  Patient Name: [name] Birthdate: [birth year] Sex: male Admission Date (Current Location): 04/27/2021  [facility] and [place] Number:  Herbalist and Address:  [facility],  [place], [place]      Provider Number: [id number]  Attending Physician Name and Address:  Eugenie Filler, MD  Relative Name and Phone Number:  Wyman Songster ([phone]    Current Level of Care: Hospital Recommended Level of Care: [place], [place] Prior Approval Number:    Date Approved/Denied:   PASRR Number:    Discharge Plan: SNF [facility])    Current Diagnoses: Patient Active Problem List   Diagnosis Date Noted   Fecal impaction in rectum [facility])    Pseudo-obstruction of colon    Ileus ([place]) 04/28/2021   Hypokalemia 04/28/2021   Mild protein malnutrition ([place]) 04/28/2021   Parkinson's disease dementia ([place]) 04/28/2021   Constipation 04/28/2021   NAFL (nonalcoholic fatty liver) 92/33/0076   Ulcerative colitis ([place]) 10/27/2014   Melanoma of skin, site unspecified 12/06/2013   Parkinsonism ([place]) 08/15/2013   Rib fracture 08/12/2013   Syncope 08/12/2013   Murmur 03/14/2013   Syncope and collapse 02/25/2013   Memory loss    Benign paroxysmal positional vertigo    Essential hypertension    Hyperlipidemia    Abnormality of gait    Obstructive hydrocephalus (HCC)    Gait disorder 02/04/2013    Orientation RESPIRATION BLADDER Height & Weight     Self, Place  Normal Incontinent Weight: 78 kg Height:  5\' 9"  (175.3 cm)  BEHAVIORAL SYMPTOMS/MOOD NEUROLOGICAL BOWEL NUTRITION STATUS      Incontinent Diet (Soft, low sodium, heart healthy)  AMBULATORY STATUS COMMUNICATION OF NEEDS Skin   Extensive Assist Verbally Normal                       Personal Care Assistance Level of Assistance  Bathing, Feeding, Dressing Bathing  Assistance: Maximum assistance Feeding assistance: Limited assistance Dressing Assistance: Maximum assistance     Functional Limitations Info  Sight, Hearing, Speech Sight Info: Adequate Hearing Info: Impaired Speech Info: Adequate    SPECIAL CARE FACTORS FREQUENCY                       Contractures Contractures Info: Not present    Additional Factors Info  Code Status, Allergies Code Status Info: DNR Allergies Info: Lexapro, Tetracycline           Current Medications (04/30/2021):  This is the current hospital active medication list Current Facility-Administered Medications  Medication Dose Route Frequency Provider Last Rate Last Admin   0.9 %  sodium chloride infusion   Intravenous Continuous Eugenie Filler, MD 75 mL/hr at 04/30/21 1334 New Bag at 04/30/21 1334   acetaminophen (TYLENOL) tablet 650 mg  650 mg Oral Q6H PRN Reubin Milan, MD       Or   acetaminophen (TYLENOL) suppository 650 mg  650 mg Rectal Q6H PRN Reubin Milan, MD       carbidopa-levodopa (SINEMET IR) 25-100 MG per tablet immediate release 1 tablet  1 tablet Oral TID Domenic Polite, MD   1 tablet at 04/30/21 1611   enoxaparin (LOVENOX) injection 40 mg  40 mg Subcutaneous Q24H Reubin Milan, MD   40 mg at 04/30/21 1034   feeding supplement (ENSURE ENLIVE / ENSURE PLUS)  liquid 237 mL  237 mL Oral BID BM Eugenie Filler, MD   237 mL at 04/30/21 1334   lactulose (CHRONULAC) 10 GM/15ML solution 20 g  20 g Oral BID Domenic Polite, MD   20 g at 04/30/21 1033   lisinopril (ZESTRIL) tablet 2.5 mg  2.5 mg Oral Daily Blount, Scarlette Shorts T, NP   2.5 mg at 04/30/21 1034   mesalamine (LIALDA) EC tablet 1.2 g  1.2 g Oral Q breakfast Domenic Polite, MD   1.2 g at 04/30/21 1033   metoprolol tartrate (LOPRESSOR) injection 2.5 mg  2.5 mg Intravenous Q6H PRN Reubin Milan, MD       ondansetron [facility]) tablet 4 mg  4 mg Oral Q6H PRN Reubin Milan, MD       Or   ondansetron [facility])  injection 4 mg  4 mg Intravenous Q6H PRN Reubin Milan, MD       pantoprazole (PROTONIX) injection 40 mg  40 mg Intravenous Q24H Reubin Milan, MD   40 mg at 04/30/21 1033   sertraline (ZOLOFT) tablet 25 mg  25 mg Oral QHS Domenic Polite, MD   25 mg at 04/29/21 2231     Discharge Medications: Please see discharge summary for a list of discharge medications.  Relevant Imaging Results:  Relevant Lab Results:   Additional Information [place], [place]

## 2021-04-30 NOTE — Discharge Summary (Signed)
Physician Discharge Summary  [name] CHE:[id number] DOB: 16-Nov-1940 DOA: 04/27/2021  PCP: Gregory Housekeeper, MD  Admit date: 04/27/2021 Discharge date: 04/30/2021  Time spent: 55 minutes  Recommendations for Outpatient Follow-up:  Follow-up with PCP in 2 weeks.  On follow-up patient need a basic metabolic profile done to follow-up on electrolytes and renal function. 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 Coralyn [name] is a 80 y.o. male with medical history significant of osteoarthritis of the left knee, asthma, facial basal cell CA, BPPV, cataracts, Parkinson's disease, Parkinson's dementia, colon polyps, hyperlipidemia, oral mucosa leukoplakia, obstructive hydrocephalus, GERD, ulcerative colitis, essential hypertension who is coming to the emergency department from [facility] due to abdominal distention, nausea and vomiting of bilious contents.  He is unable to provide further information at this time.   ED Course: Initial vital signs were temperature 99.1 F, pulse 87, respirations 22, BP 154/98 mmHg O2 sat 96% on room air.  The patient received ondansetron 4 mg IVP and then milk of molasses enema with partial manual disimpaction.   Lab work: CBC was normal.  CMP showed a potassium of 3.1 mmol/L.  Glucose 125 mg/dL.  Total protein 6.3 and albumin 3.2 g/dL.  The rest of the CMP results are normal when calcium is corrected to albumin.   Imaging: CT abdomen/pelvis without contrast showed dilated gas-filled colon with large amount of stool in the  rectum suggesting either pseudoobstruction or ileus.  Perirectal soft tissue infiltration may be due to a stercoral colitis.  There is infiltration or rectal ectasis in both lung bases which may indicate pneumonia.  There is fat infiltration of the liver.  Polycystic kidneys.  Aortic atherosclerosis.  Hospital Course:  1 nausea/vomiting/constipation/pseudoobstruction/ileus -Patient presented with nausea vomiting abdominal distention and pain. -CT abdomen and pelvis with dilated gas-filled colon with large amount of stool in the rectum suggesting pseudoobstruction versus ileus. -Status post milk of molasses enema in the ED which resulted in small bowel movement. -Patient received Dulcolax suppository and lactulose twice daily with large bowel movements and resolution of constipation.   -Diet was advanced which patient tolerated.   -Patient with discharge back to skilled nursing facility on lactulose twice daily.   -Follow-up with PCP and MD at SNF.  2.  Parkinson's disease/dementia -Patient maintained on home regimen Sinemet.  3.  Hypertension -Patient maintained on home regimen lisinopril during the hospitalization.  -Outpatient follow-up.  4.  Atelectasis -Patient noted to be bedbound at baseline. -Incentive spirometry.  5.  Moderate protein calorie malnutrition -Initially placed on clears and diet advanced as patient improved clinically.  Patient tolerating a soft diet by day of discharge.   -Patient also placed on Ensure supplements which will be discharged home.    6.  Hypokalemia -Magnesium at 2.0. -Potassium at 3.9. -Electrolytes repleted and potassium was 4.0 at time of discharge.    Procedures: CT abdomen and pelvis 04/28/2021    Consultations: None  Discharge Exam: Vitals:   04/30/21 0530 04/30/21 1209  BP: (!) 145/80 131/82  Pulse: Marland Kitchen)  59 72  Resp: 20 20  Temp: 98 F (36.7 C) 98.6 F (37 C)  SpO2: 97% 99%    General:  NAD Cardiovascular: RRR no murmurs  rubs or gallops.  No JVD.  No lower extremity edema. Respiratory: CTA B.  No wheezes, no crackles, no rhonchi.  Discharge Instructions   Discharge Instructions     Diet - low sodium heart healthy   Complete by: As directed    Increase activity slowly   Complete by: As directed       Allergies as of 04/30/2021       Reactions   Lexapro [escitalopram Oxalate] Other (See Comments)   Unknown: reaction occurred some time ago and pt cannot remember what it was   Tetracyclines & Related Other (See Comments)   Unknown: Reaction occurred some time ago and pt cannot remember what it was         Medication List     TAKE these medications    acetaminophen 500 MG tablet Commonly known as: TYLENOL Take 500 mg by mouth every 6 (six) hours as needed (pain).   carbidopa-levodopa 25-100 MG tablet Commonly known as: SINEMET IR TAKE  (1)  TABLET  THREE TIMES DAILY. What changed: See the new instructions.   feeding supplement Liqd Take 237 mLs by mouth 2 (two) times daily between meals. Start taking on: May 01, 2021   lactulose 10 GM/15ML solution Commonly known as: CHRONULAC Take 30 mLs (20 g total) by mouth 2 (two) times daily.   lisinopril 2.5 MG tablet Commonly known as: ZESTRIL Take 2.5 mg by mouth every morning.   meclizine 25 MG tablet Commonly known as: ANTIVERT Take 12.5 mg by mouth 3 (three) times daily as needed for dizziness or nausea.   mesalamine 1.2 g EC tablet Commonly known as: LIALDA Take 1.2 g by mouth every morning.   pantoprazole 40 MG tablet Commonly known as: PROTONIX Take 40 mg by mouth at bedtime.   polyvinyl alcohol 1.4 % ophthalmic solution Commonly known as: LIQUIFILM TEARS Place 2 drops into both eyes every morning.   pravastatin 20 MG tablet Commonly known as: PRAVACHOL Take 20 mg by mouth at bedtime.   sertraline 25 MG tablet Commonly known as: ZOLOFT Take 12.5 mg by mouth at bedtime.   tolterodine 4 MG 24 hr capsule Commonly  known as: DETROL LA Take 4 mg by mouth in the morning.       Allergies  Allergen Reactions   Lexapro [Escitalopram Oxalate] Other (See Comments)    Unknown: reaction occurred some time ago and pt cannot remember what it was   Tetracyclines & Related Other (See Comments)    Unknown: Reaction occurred some time ago and pt cannot remember what it was     Follow-up Information     Gregory Housekeeper, MD. Schedule an appointment as soon as possible for a visit in 2 week(s).   Specialty: Family Medicine Contact information: [place] [id number] [phone]         md at snf Follow up.                   The results of significant diagnostics from this hospitalization (including imaging, microbiology, ancillary and laboratory) are listed below for reference.    Significant Diagnostic Studies: CT ABDOMEN PELVIS WO CONTRAST  Result Date: 04/28/2021 CLINICAL DATA:  Bowel obstruction suspected. Two day history of nausea, vomiting, and abdominal distention. EXAM: CT ABDOMEN AND PELVIS WITHOUT CONTRAST TECHNIQUE:  Multidetector CT imaging of the abdomen and pelvis was performed following the standard protocol without IV contrast. COMPARISON:  03/14/2015 FINDINGS: Lower chest: Infiltration or atelectasis in both lung bases, possibly pneumonia. Hepatobiliary: Mild diffuse fatty infiltration of the liver. Gallbladder and bile ducts are unremarkable. Pancreas: Unremarkable. No pancreatic ductal dilatation or surrounding inflammatory changes. Spleen: Normal in size without focal abnormality. Adrenals/Urinary Tract: No adrenal gland nodules. Multiple cysts throughout both kidneys, some with mural calcification. Appearance consistent with polycystic kidneys. No hydronephrosis or hydroureter. No stones identified. Bladder is unremarkable. Stomach/Bowel: Stomach is not abnormally distended. Small bowel are mostly decompressed. There is a very large amount of stool in the rectum.  The proximal colon is dilated and gas filled. Changes may indicate colonic ileus or pseudo-obstruction due to the rectal stool. Mild infiltration in the perirectal fat may indicate stercoral colitis. Appendix is normal. Vascular/Lymphatic: Aortic atherosclerosis. No enlarged abdominal or pelvic lymph nodes. Reproductive: Prostate gland is surgically absent. Other: No free air or free fluid in the abdomen. Abdominal wall musculature appears intact. Musculoskeletal: Degenerative changes in the spine. Lumbar scoliosis convex towards the left. No evidence of bone metastasis. IMPRESSION: 1. Dilated gas-filled colon with large amount of stool in the rectum suggesting either pseudo-obstruction or ileus. Perirectal soft tissue infiltration may indicate stercoral colitis. 2. Infiltration or atelectasis in both lung bases may indicate pneumonia. 3. Fatty infiltration of the liver. 4. Polycystic kidneys. 5. Aortic atherosclerosis. Electronically Signed   By: Lucienne Capers M.D.   On: 04/28/2021 01:33    Microbiology: Recent Results (from the past 240 hour(s))  Resp Panel by RT-PCR (Flu A&B, Covid) Nasopharyngeal Swab     Status: None   Collection Time: 04/28/21  6:23 AM   Specimen: Nasopharyngeal Swab; Nasopharyngeal(NP) swabs in vial transport medium  Result Value Ref Range Status   SARS Coronavirus 2 by RT PCR NEGATIVE NEGATIVE Final    Comment: (NOTE) SARS-CoV-2 target nucleic acids are NOT DETECTED.  The SARS-CoV-2 RNA is generally detectable in upper respiratory specimens during the acute phase of infection. The lowest concentration of SARS-CoV-2 viral copies this assay can detect is 138 copies/mL. A negative result does not preclude SARS-Cov-2 infection and should not be used as the sole basis for treatment or other patient management decisions. A negative result may occur with  improper specimen collection/handling, submission of specimen other than nasopharyngeal swab, presence of viral mutation(s)  within the areas targeted by this assay, and inadequate number of viral copies(<138 copies/mL). A negative result must be combined with clinical observations, patient history, and epidemiological information. The expected result is Negative.  Fact Sheet for Patients:  EntrepreneurPulse.com.au  Fact Sheet for Healthcare Providers:  IncredibleEmployment.be  This test is no t yet approved or cleared by the [place] FDA and  has been authorized for detection and/or diagnosis of SARS-CoV-2 by FDA under an Emergency Use Authorization (EUA). This EUA will remain  in effect (meaning this test can be used) for the duration of the COVID-19 declaration under Section 564(b)(1) of the Act, 21 U.S.C.section 360bbb-3(b)(1), unless the authorization is terminated  or revoked sooner.       Influenza A by PCR NEGATIVE NEGATIVE Final   Influenza B by PCR NEGATIVE NEGATIVE Final    Comment: (NOTE) The Xpert Xpress SARS-CoV-2/FLU/RSV plus assay is intended as an aid in the diagnosis of influenza from Nasopharyngeal swab specimens and should not be used as a sole basis for treatment. Nasal washings and aspirates are unacceptable for Xpert Xpress SARS-CoV-2/FLU/RSV  testing.  Fact Sheet for Patients: EntrepreneurPulse.com.au  Fact Sheet for Healthcare Providers: IncredibleEmployment.be  This test is not yet approved or cleared by the [place] FDA and has been authorized for detection and/or diagnosis of SARS-CoV-2 by FDA under an Emergency Use Authorization (EUA). This EUA will remain in effect (meaning this test can be used) for the duration of the COVID-19 declaration under Section 564(b)(1) of the Act, 21 U.S.C. section 360bbb-3(b)(1), unless the authorization is terminated or revoked.  Performed at [facility], [place] [address], [place], [place] [address]   MRSA Next Gen by PCR, Nasal      Status: None   Collection Time: 04/29/21  9:00 AM   Specimen: Nasal Mucosa; Nasal Swab  Result Value Ref Range Status   MRSA by PCR Next Gen NOT DETECTED NOT DETECTED Final    Comment: (NOTE) The GeneXpert MRSA Assay (FDA approved for NASAL specimens only), is one component of a comprehensive MRSA colonization surveillance program. It is not intended to diagnose MRSA infection nor to guide or monitor treatment for MRSA infections. Test performance is not FDA approved in patients less than 31 years old. Performed at [facility], [place] [address] [place], [place] [address]      Labs: Basic Metabolic Panel: Recent Labs  Lab 04/28/21 0058 04/29/21 0432 04/30/21 0519  NA 136 141 132*  K 3.1* 3.9 4.0  CL 102 110 100  CO2 22 25 25   GLUCOSE 125* 90 84  BUN 20 14 9   CREATININE 0.67 0.63 0.50*  CALCIUM 8.5* 8.5* 8.5*  MG  --  2.0 1.9   Liver Function Tests: Recent Labs  Lab 04/28/21 0058 04/29/21 0432  AST 23 16  ALT 18 6  ALKPHOS 74 50  BILITOT 0.9 0.7  PROT 6.3* 5.3*  ALBUMIN 3.2* 2.7*   No results for input(s): LIPASE, AMYLASE in the last 168 hours. No results for input(s): AMMONIA in the last 168 hours. CBC: Recent Labs  Lab 04/28/21 0058 04/29/21 0432 04/30/21 0519  WBC 7.6 5.8 6.4  NEUTROABS 6.0  --   --   HGB 14.9 12.9* 14.3  HCT 44.8 39.3 42.6  MCV 92.6 94.0 91.2  PLT 184 167 182   Cardiac Enzymes: No results for input(s): CKTOTAL, CKMB, CKMBINDEX, TROPONINI in the last 168 hours. BNP: BNP (last 3 results) No results for input(s): BNP in the last 8760 hours.  ProBNP (last 3 results) No results for input(s): PROBNP in the last 8760 hours.  CBG: No results for input(s): GLUCAP in the last 168 hours.     Signed:  Irine Seal MD.  [facility] 04/30/2021, 2:47 PM

## 2021-05-01 LAB — BASIC METABOLIC PANEL
Anion gap: 7 (ref 5–15)
BUN: 13 mg/dL (ref 8–23)
CO2: 27 mmol/L (ref 22–32)
Calcium: 8.8 mg/dL — ABNORMAL LOW (ref 8.9–10.3)
Chloride: 102 mmol/L (ref 98–111)
Creatinine, Ser: 0.54 mg/dL — ABNORMAL LOW (ref 0.61–1.24)
GFR, Estimated: >60 mL/min (ref >60)
Glucose, Bld: 95 mg/dL (ref 70–99)
Potassium: 3.8 mmol/L (ref 3.5–5.1)
Sodium: 136 mmol/L (ref 135–145)

## 2021-05-01 LAB — MAGNESIUM: Magnesium: 2.1 mg/dL (ref 1.7–2.4)

## 2021-05-01 MED ORDER — PANTOPRAZOLE SODIUM 40 MG PO TBEC: 40.0000 mg | DELAYED_RELEASE_TABLET | Freq: Every day | ORAL | Status: DC

## 2021-05-01 NOTE — TOC Transition Note (Addendum)
Transition of Care [facility]) - CM/SW Discharge Note   Patient Details  Name: [name] MRN: [id number] Date of Birth: [birth year]  [facility]) CM/SW Contact:  Trish Mage, LCSW Phone Number: 05/01/2021, 10:07 AM   Clinical Narrative:  Patient who is stable for d/c will transfer back to [place] today.  PTAR arranged.  Nursing, please call report to [phone], room 24.  TOC sign off.    Final next level of care: Skilled Nursing Facility Barriers to Discharge: Barriers Resolved   Patient Goals and CMS Choice        Discharge Placement                       Discharge Plan and Services                                     Social Determinants of Health (SDOH) Interventions     Readmission Risk Interventions No flowsheet data found.

## 2021-05-01 NOTE — Discharge Summary (Signed)
Patient is waiting to be discharged to SNF.  Patient seen and examined at bedside.  He is currently medically stable for discharge to SNF.  Please refer to the discharge summary done by Dr. Irine Seal on 04/30/2021 for full details.  He will be discharged to SNF today if bed is available.  Date of discharge: 05/01/2021 Discharge disposition: SNF

## 2021-05-01 NOTE — Progress Notes (Signed)
PHARMACIST - PHYSICIAN COMMUNICATION  DR:   Starla Link  CONCERNING: IV to Oral Route Change Policy  RECOMMENDATION: This patient is receiving pantoprazole by the intravenous route.  Based on criteria approved by the Pharmacy and Therapeutics Committee, the intravenous medication(s) is/are being converted to the equivalent oral dose form(s).   DESCRIPTION: These criteria include: The patient is eating (either orally or via tube) and/or has been taking other orally administered medications for a least 24 hours The patient has no evidence of active gastrointestinal bleeding or impaired GI absorption (gastrectomy, short bowel, patient on TNA or NPO).  If you have questions about this conversion, please contact the Pharmacy Department  []   [phone] )  [facility] []   [phone] )  [facility] []   [phone] )  [facility] []   [phone] )  [facility] [x]   [phone] )  [place], [facility] 05/01/2021 8:56 AM

## 2021-05-01 NOTE — Progress Notes (Signed)
Report given to Journalist, newspaper at [facility]. All questions answered. AVS placed in discharge packet.

## 2022-07-17 DIAGNOSIS — I10 Essential (primary) hypertension: Secondary | ICD-10-CM | POA: Diagnosis not present

## 2022-07-17 DIAGNOSIS — E559 Vitamin D deficiency, unspecified: Secondary | ICD-10-CM | POA: Diagnosis not present

## 2022-07-17 DIAGNOSIS — E119 Type 2 diabetes mellitus without complications: Secondary | ICD-10-CM | POA: Diagnosis not present

## 2022-07-17 DIAGNOSIS — E785 Hyperlipidemia, unspecified: Secondary | ICD-10-CM | POA: Diagnosis not present

## 2022-07-17 DIAGNOSIS — F339 Major depressive disorder, recurrent, unspecified: Secondary | ICD-10-CM | POA: Diagnosis not present

## 2022-07-18 DIAGNOSIS — E785 Hyperlipidemia, unspecified: Secondary | ICD-10-CM | POA: Diagnosis not present

## 2022-07-18 DIAGNOSIS — I160 Hypertensive urgency: Secondary | ICD-10-CM | POA: Diagnosis not present

## 2022-07-18 DIAGNOSIS — I10 Essential (primary) hypertension: Secondary | ICD-10-CM | POA: Diagnosis not present

## 2022-07-21 DIAGNOSIS — N3281 Overactive bladder: Secondary | ICD-10-CM | POA: Diagnosis not present

## 2022-07-21 DIAGNOSIS — E440 Moderate protein-calorie malnutrition: Secondary | ICD-10-CM | POA: Diagnosis not present

## 2022-07-21 DIAGNOSIS — F0390 Unspecified dementia without behavioral disturbance: Secondary | ICD-10-CM | POA: Diagnosis not present

## 2022-07-21 DIAGNOSIS — F32A Depression, unspecified: Secondary | ICD-10-CM | POA: Diagnosis not present

## 2022-07-21 DIAGNOSIS — I739 Peripheral vascular disease, unspecified: Secondary | ICD-10-CM | POA: Diagnosis not present

## 2022-07-21 DIAGNOSIS — R600 Localized edema: Secondary | ICD-10-CM | POA: Diagnosis not present

## 2022-07-21 DIAGNOSIS — R011 Cardiac murmur, unspecified: Secondary | ICD-10-CM | POA: Diagnosis not present

## 2022-07-21 DIAGNOSIS — E785 Hyperlipidemia, unspecified: Secondary | ICD-10-CM | POA: Diagnosis not present

## 2022-07-21 DIAGNOSIS — K219 Gastro-esophageal reflux disease without esophagitis: Secondary | ICD-10-CM | POA: Diagnosis not present

## 2022-07-24 DIAGNOSIS — L853 Xerosis cutis: Secondary | ICD-10-CM | POA: Diagnosis not present

## 2022-07-24 DIAGNOSIS — J040 Acute laryngitis: Secondary | ICD-10-CM | POA: Diagnosis not present

## 2022-07-24 DIAGNOSIS — R600 Localized edema: Secondary | ICD-10-CM | POA: Diagnosis not present

## 2022-07-24 DIAGNOSIS — I10 Essential (primary) hypertension: Secondary | ICD-10-CM | POA: Diagnosis not present

## 2022-07-24 DIAGNOSIS — E785 Hyperlipidemia, unspecified: Secondary | ICD-10-CM | POA: Diagnosis not present

## 2022-07-24 DIAGNOSIS — E440 Moderate protein-calorie malnutrition: Secondary | ICD-10-CM | POA: Diagnosis not present

## 2022-07-24 DIAGNOSIS — I739 Peripheral vascular disease, unspecified: Secondary | ICD-10-CM | POA: Diagnosis not present

## 2022-07-24 DIAGNOSIS — K219 Gastro-esophageal reflux disease without esophagitis: Secondary | ICD-10-CM | POA: Diagnosis not present

## 2022-08-14 DIAGNOSIS — I160 Hypertensive urgency: Secondary | ICD-10-CM | POA: Diagnosis not present

## 2022-08-14 DIAGNOSIS — E785 Hyperlipidemia, unspecified: Secondary | ICD-10-CM | POA: Diagnosis not present

## 2022-08-14 DIAGNOSIS — I10 Essential (primary) hypertension: Secondary | ICD-10-CM | POA: Diagnosis not present

## 2022-08-18 DIAGNOSIS — H04123 Dry eye syndrome of bilateral lacrimal glands: Secondary | ICD-10-CM | POA: Diagnosis not present

## 2022-08-18 DIAGNOSIS — R600 Localized edema: Secondary | ICD-10-CM | POA: Diagnosis not present

## 2022-08-18 DIAGNOSIS — E785 Hyperlipidemia, unspecified: Secondary | ICD-10-CM | POA: Diagnosis not present

## 2022-08-18 DIAGNOSIS — F0390 Unspecified dementia without behavioral disturbance: Secondary | ICD-10-CM | POA: Diagnosis not present

## 2022-08-18 DIAGNOSIS — K219 Gastro-esophageal reflux disease without esophagitis: Secondary | ICD-10-CM | POA: Diagnosis not present

## 2022-08-18 DIAGNOSIS — I10 Essential (primary) hypertension: Secondary | ICD-10-CM | POA: Diagnosis not present

## 2022-08-18 DIAGNOSIS — I739 Peripheral vascular disease, unspecified: Secondary | ICD-10-CM | POA: Diagnosis not present

## 2022-08-18 DIAGNOSIS — N3281 Overactive bladder: Secondary | ICD-10-CM | POA: Diagnosis not present

## 2022-08-18 DIAGNOSIS — G20A1 Parkinson's disease without dyskinesia, without mention of fluctuations: Secondary | ICD-10-CM | POA: Diagnosis not present

## 2022-08-28 DIAGNOSIS — R600 Localized edema: Secondary | ICD-10-CM | POA: Diagnosis not present

## 2022-08-28 DIAGNOSIS — E440 Moderate protein-calorie malnutrition: Secondary | ICD-10-CM | POA: Diagnosis not present

## 2022-08-28 DIAGNOSIS — E785 Hyperlipidemia, unspecified: Secondary | ICD-10-CM | POA: Diagnosis not present

## 2022-08-28 DIAGNOSIS — I739 Peripheral vascular disease, unspecified: Secondary | ICD-10-CM | POA: Diagnosis not present

## 2022-08-28 DIAGNOSIS — N3281 Overactive bladder: Secondary | ICD-10-CM | POA: Diagnosis not present

## 2022-08-28 DIAGNOSIS — F0390 Unspecified dementia without behavioral disturbance: Secondary | ICD-10-CM | POA: Diagnosis not present

## 2022-08-28 DIAGNOSIS — F32A Depression, unspecified: Secondary | ICD-10-CM | POA: Diagnosis not present

## 2022-08-28 DIAGNOSIS — I10 Essential (primary) hypertension: Secondary | ICD-10-CM | POA: Diagnosis not present

## 2022-08-28 DIAGNOSIS — R011 Cardiac murmur, unspecified: Secondary | ICD-10-CM | POA: Diagnosis not present

## 2022-08-28 DIAGNOSIS — K219 Gastro-esophageal reflux disease without esophagitis: Secondary | ICD-10-CM | POA: Diagnosis not present

## 2022-09-17 DIAGNOSIS — E785 Hyperlipidemia, unspecified: Secondary | ICD-10-CM | POA: Diagnosis not present

## 2022-09-17 DIAGNOSIS — R059 Cough, unspecified: Secondary | ICD-10-CM | POA: Diagnosis not present

## 2022-09-17 DIAGNOSIS — R600 Localized edema: Secondary | ICD-10-CM | POA: Diagnosis not present

## 2022-09-17 DIAGNOSIS — E440 Moderate protein-calorie malnutrition: Secondary | ICD-10-CM | POA: Diagnosis not present

## 2022-09-17 DIAGNOSIS — N3281 Overactive bladder: Secondary | ICD-10-CM | POA: Diagnosis not present

## 2022-09-17 DIAGNOSIS — R011 Cardiac murmur, unspecified: Secondary | ICD-10-CM | POA: Diagnosis not present

## 2022-09-17 DIAGNOSIS — I739 Peripheral vascular disease, unspecified: Secondary | ICD-10-CM | POA: Diagnosis not present

## 2022-09-17 DIAGNOSIS — K219 Gastro-esophageal reflux disease without esophagitis: Secondary | ICD-10-CM | POA: Diagnosis not present

## 2022-09-17 DIAGNOSIS — J22 Unspecified acute lower respiratory infection: Secondary | ICD-10-CM | POA: Diagnosis not present

## 2022-09-17 DIAGNOSIS — I10 Essential (primary) hypertension: Secondary | ICD-10-CM | POA: Diagnosis not present

## 2022-09-18 DIAGNOSIS — Z20828 Contact with and (suspected) exposure to other viral communicable diseases: Secondary | ICD-10-CM | POA: Diagnosis not present

## 2022-09-22 DIAGNOSIS — E785 Hyperlipidemia, unspecified: Secondary | ICD-10-CM | POA: Diagnosis not present

## 2022-09-22 DIAGNOSIS — I10 Essential (primary) hypertension: Secondary | ICD-10-CM | POA: Diagnosis not present

## 2022-09-22 DIAGNOSIS — I160 Hypertensive urgency: Secondary | ICD-10-CM | POA: Diagnosis not present

## 2022-09-25 DIAGNOSIS — I10 Essential (primary) hypertension: Secondary | ICD-10-CM | POA: Diagnosis not present

## 2022-09-25 DIAGNOSIS — K219 Gastro-esophageal reflux disease without esophagitis: Secondary | ICD-10-CM | POA: Diagnosis not present

## 2022-09-25 DIAGNOSIS — I739 Peripheral vascular disease, unspecified: Secondary | ICD-10-CM | POA: Diagnosis not present

## 2022-09-25 DIAGNOSIS — B338 Other specified viral diseases: Secondary | ICD-10-CM | POA: Diagnosis not present

## 2022-09-25 DIAGNOSIS — F0390 Unspecified dementia without behavioral disturbance: Secondary | ICD-10-CM | POA: Diagnosis not present

## 2022-09-25 DIAGNOSIS — R600 Localized edema: Secondary | ICD-10-CM | POA: Diagnosis not present

## 2022-09-25 DIAGNOSIS — E440 Moderate protein-calorie malnutrition: Secondary | ICD-10-CM | POA: Diagnosis not present

## 2022-09-25 DIAGNOSIS — E785 Hyperlipidemia, unspecified: Secondary | ICD-10-CM | POA: Diagnosis not present

## 2022-09-25 DIAGNOSIS — J22 Unspecified acute lower respiratory infection: Secondary | ICD-10-CM | POA: Diagnosis not present

## 2022-09-26 DIAGNOSIS — I517 Cardiomegaly: Secondary | ICD-10-CM | POA: Diagnosis not present

## 2022-10-04 DIAGNOSIS — R600 Localized edema: Secondary | ICD-10-CM | POA: Diagnosis not present

## 2022-10-06 DIAGNOSIS — I5040 Unspecified combined systolic (congestive) and diastolic (congestive) heart failure: Secondary | ICD-10-CM | POA: Diagnosis not present

## 2022-10-07 DIAGNOSIS — B351 Tinea unguium: Secondary | ICD-10-CM | POA: Diagnosis not present

## 2022-10-07 DIAGNOSIS — I7091 Generalized atherosclerosis: Secondary | ICD-10-CM | POA: Diagnosis not present

## 2022-10-15 DIAGNOSIS — I10 Essential (primary) hypertension: Secondary | ICD-10-CM | POA: Diagnosis not present

## 2022-10-15 DIAGNOSIS — R011 Cardiac murmur, unspecified: Secondary | ICD-10-CM | POA: Diagnosis not present

## 2022-10-15 DIAGNOSIS — E785 Hyperlipidemia, unspecified: Secondary | ICD-10-CM | POA: Diagnosis not present

## 2022-10-15 DIAGNOSIS — R600 Localized edema: Secondary | ICD-10-CM | POA: Diagnosis not present

## 2022-10-15 DIAGNOSIS — I739 Peripheral vascular disease, unspecified: Secondary | ICD-10-CM | POA: Diagnosis not present

## 2022-10-15 DIAGNOSIS — E440 Moderate protein-calorie malnutrition: Secondary | ICD-10-CM | POA: Diagnosis not present

## 2022-10-15 DIAGNOSIS — H04123 Dry eye syndrome of bilateral lacrimal glands: Secondary | ICD-10-CM | POA: Diagnosis not present

## 2022-10-15 DIAGNOSIS — K219 Gastro-esophageal reflux disease without esophagitis: Secondary | ICD-10-CM | POA: Diagnosis not present

## 2022-10-15 DIAGNOSIS — F0390 Unspecified dementia without behavioral disturbance: Secondary | ICD-10-CM | POA: Diagnosis not present

## 2022-10-16 DIAGNOSIS — I10 Essential (primary) hypertension: Secondary | ICD-10-CM | POA: Diagnosis not present

## 2022-10-18 DIAGNOSIS — I160 Hypertensive urgency: Secondary | ICD-10-CM | POA: Diagnosis not present

## 2022-10-18 DIAGNOSIS — E785 Hyperlipidemia, unspecified: Secondary | ICD-10-CM | POA: Diagnosis not present

## 2022-10-18 DIAGNOSIS — I10 Essential (primary) hypertension: Secondary | ICD-10-CM | POA: Diagnosis not present

## 2022-10-18 DIAGNOSIS — F0390 Unspecified dementia without behavioral disturbance: Secondary | ICD-10-CM | POA: Diagnosis not present

## 2022-10-22 DIAGNOSIS — J811 Chronic pulmonary edema: Secondary | ICD-10-CM | POA: Diagnosis not present

## 2022-10-30 DIAGNOSIS — N3281 Overactive bladder: Secondary | ICD-10-CM | POA: Diagnosis not present

## 2022-10-30 DIAGNOSIS — R011 Cardiac murmur, unspecified: Secondary | ICD-10-CM | POA: Diagnosis not present

## 2022-10-30 DIAGNOSIS — E440 Moderate protein-calorie malnutrition: Secondary | ICD-10-CM | POA: Diagnosis not present

## 2022-10-30 DIAGNOSIS — R600 Localized edema: Secondary | ICD-10-CM | POA: Diagnosis not present

## 2022-10-30 DIAGNOSIS — F0390 Unspecified dementia without behavioral disturbance: Secondary | ICD-10-CM | POA: Diagnosis not present

## 2022-10-30 DIAGNOSIS — E785 Hyperlipidemia, unspecified: Secondary | ICD-10-CM | POA: Diagnosis not present

## 2022-10-30 DIAGNOSIS — I739 Peripheral vascular disease, unspecified: Secondary | ICD-10-CM | POA: Diagnosis not present

## 2022-10-30 DIAGNOSIS — I10 Essential (primary) hypertension: Secondary | ICD-10-CM | POA: Diagnosis not present

## 2022-10-30 DIAGNOSIS — K219 Gastro-esophageal reflux disease without esophagitis: Secondary | ICD-10-CM | POA: Diagnosis not present

## 2022-11-03 DIAGNOSIS — I739 Peripheral vascular disease, unspecified: Secondary | ICD-10-CM | POA: Diagnosis not present

## 2022-11-03 DIAGNOSIS — H04123 Dry eye syndrome of bilateral lacrimal glands: Secondary | ICD-10-CM | POA: Diagnosis not present

## 2022-11-03 DIAGNOSIS — L853 Xerosis cutis: Secondary | ICD-10-CM | POA: Diagnosis not present

## 2022-11-03 DIAGNOSIS — I10 Essential (primary) hypertension: Secondary | ICD-10-CM | POA: Diagnosis not present

## 2022-11-03 DIAGNOSIS — F0390 Unspecified dementia without behavioral disturbance: Secondary | ICD-10-CM | POA: Diagnosis not present

## 2022-11-03 DIAGNOSIS — R600 Localized edema: Secondary | ICD-10-CM | POA: Diagnosis not present

## 2022-11-03 DIAGNOSIS — G20A1 Parkinson's disease without dyskinesia, without mention of fluctuations: Secondary | ICD-10-CM | POA: Diagnosis not present

## 2022-11-03 DIAGNOSIS — B372 Candidiasis of skin and nail: Secondary | ICD-10-CM | POA: Diagnosis not present

## 2022-11-03 DIAGNOSIS — E440 Moderate protein-calorie malnutrition: Secondary | ICD-10-CM | POA: Diagnosis not present

## 2022-11-19 DIAGNOSIS — U071 COVID-19: Secondary | ICD-10-CM | POA: Diagnosis not present

## 2022-11-19 DIAGNOSIS — B372 Candidiasis of skin and nail: Secondary | ICD-10-CM | POA: Diagnosis not present

## 2022-11-19 DIAGNOSIS — E785 Hyperlipidemia, unspecified: Secondary | ICD-10-CM | POA: Diagnosis not present

## 2022-11-19 DIAGNOSIS — K219 Gastro-esophageal reflux disease without esophagitis: Secondary | ICD-10-CM | POA: Diagnosis not present

## 2022-11-19 DIAGNOSIS — G20A1 Parkinson's disease without dyskinesia, without mention of fluctuations: Secondary | ICD-10-CM | POA: Diagnosis not present

## 2022-11-19 DIAGNOSIS — I739 Peripheral vascular disease, unspecified: Secondary | ICD-10-CM | POA: Diagnosis not present

## 2022-11-19 DIAGNOSIS — R600 Localized edema: Secondary | ICD-10-CM | POA: Diagnosis not present

## 2022-11-19 DIAGNOSIS — N3281 Overactive bladder: Secondary | ICD-10-CM | POA: Diagnosis not present

## 2022-11-19 DIAGNOSIS — R011 Cardiac murmur, unspecified: Secondary | ICD-10-CM | POA: Diagnosis not present

## 2022-11-20 DIAGNOSIS — R601 Generalized edema: Secondary | ICD-10-CM | POA: Diagnosis not present

## 2022-12-09 DIAGNOSIS — I10 Essential (primary) hypertension: Secondary | ICD-10-CM | POA: Diagnosis not present

## 2022-12-09 DIAGNOSIS — E785 Hyperlipidemia, unspecified: Secondary | ICD-10-CM | POA: Diagnosis not present

## 2022-12-09 DIAGNOSIS — F0390 Unspecified dementia without behavioral disturbance: Secondary | ICD-10-CM | POA: Diagnosis not present

## 2022-12-09 DIAGNOSIS — I160 Hypertensive urgency: Secondary | ICD-10-CM | POA: Diagnosis not present

## 2022-12-17 DIAGNOSIS — E440 Moderate protein-calorie malnutrition: Secondary | ICD-10-CM | POA: Diagnosis not present

## 2022-12-17 DIAGNOSIS — R600 Localized edema: Secondary | ICD-10-CM | POA: Diagnosis not present

## 2022-12-17 DIAGNOSIS — R011 Cardiac murmur, unspecified: Secondary | ICD-10-CM | POA: Diagnosis not present

## 2022-12-17 DIAGNOSIS — B372 Candidiasis of skin and nail: Secondary | ICD-10-CM | POA: Diagnosis not present

## 2022-12-17 DIAGNOSIS — I739 Peripheral vascular disease, unspecified: Secondary | ICD-10-CM | POA: Diagnosis not present

## 2022-12-17 DIAGNOSIS — F0390 Unspecified dementia without behavioral disturbance: Secondary | ICD-10-CM | POA: Diagnosis not present

## 2022-12-17 DIAGNOSIS — I10 Essential (primary) hypertension: Secondary | ICD-10-CM | POA: Diagnosis not present

## 2022-12-17 DIAGNOSIS — E785 Hyperlipidemia, unspecified: Secondary | ICD-10-CM | POA: Diagnosis not present

## 2022-12-17 DIAGNOSIS — K219 Gastro-esophageal reflux disease without esophagitis: Secondary | ICD-10-CM | POA: Diagnosis not present

## 2022-12-18 DIAGNOSIS — B372 Candidiasis of skin and nail: Secondary | ICD-10-CM | POA: Diagnosis not present

## 2022-12-18 DIAGNOSIS — E785 Hyperlipidemia, unspecified: Secondary | ICD-10-CM | POA: Diagnosis not present

## 2022-12-18 DIAGNOSIS — G20A1 Parkinson's disease without dyskinesia, without mention of fluctuations: Secondary | ICD-10-CM | POA: Diagnosis not present

## 2022-12-18 DIAGNOSIS — E440 Moderate protein-calorie malnutrition: Secondary | ICD-10-CM | POA: Diagnosis not present

## 2022-12-18 DIAGNOSIS — F0390 Unspecified dementia without behavioral disturbance: Secondary | ICD-10-CM | POA: Diagnosis not present

## 2022-12-18 DIAGNOSIS — I739 Peripheral vascular disease, unspecified: Secondary | ICD-10-CM | POA: Diagnosis not present

## 2022-12-18 DIAGNOSIS — K219 Gastro-esophageal reflux disease without esophagitis: Secondary | ICD-10-CM | POA: Diagnosis not present

## 2022-12-18 DIAGNOSIS — I10 Essential (primary) hypertension: Secondary | ICD-10-CM | POA: Diagnosis not present

## 2022-12-18 DIAGNOSIS — R600 Localized edema: Secondary | ICD-10-CM | POA: Diagnosis not present

## 2022-12-22 DIAGNOSIS — I160 Hypertensive urgency: Secondary | ICD-10-CM | POA: Diagnosis not present

## 2022-12-22 DIAGNOSIS — E785 Hyperlipidemia, unspecified: Secondary | ICD-10-CM | POA: Diagnosis not present

## 2022-12-22 DIAGNOSIS — I10 Essential (primary) hypertension: Secondary | ICD-10-CM | POA: Diagnosis not present

## 2022-12-22 DIAGNOSIS — F0390 Unspecified dementia without behavioral disturbance: Secondary | ICD-10-CM | POA: Diagnosis not present

## 2024-03-11 DEATH — deceased
